# Patient Record
Sex: Male | Born: 1955 | Race: White | Hispanic: No | Marital: Married | Smoking: Never smoker
Health system: Southern US, Community
[De-identification: ages and names within clinical notes are randomized; demographics above are authoritative.]

## PROBLEM LIST (undated history)

## (undated) DIAGNOSIS — G4730 Sleep apnea, unspecified: Secondary | ICD-10-CM

## (undated) DIAGNOSIS — K5790 Diverticulosis of intestine, part unspecified, without perforation or abscess without bleeding: Secondary | ICD-10-CM

## (undated) DIAGNOSIS — M1990 Unspecified osteoarthritis, unspecified site: Secondary | ICD-10-CM

## (undated) DIAGNOSIS — E785 Hyperlipidemia, unspecified: Secondary | ICD-10-CM

## (undated) DIAGNOSIS — I2510 Atherosclerotic heart disease of native coronary artery without angina pectoris: Secondary | ICD-10-CM

## (undated) DIAGNOSIS — K648 Other hemorrhoids: Secondary | ICD-10-CM

## (undated) DIAGNOSIS — K635 Polyp of colon: Secondary | ICD-10-CM

## (undated) DIAGNOSIS — I2102 ST elevation (STEMI) myocardial infarction involving left anterior descending coronary artery: Secondary | ICD-10-CM

## (undated) DIAGNOSIS — I499 Cardiac arrhythmia, unspecified: Secondary | ICD-10-CM

## (undated) DIAGNOSIS — I4891 Unspecified atrial fibrillation: Secondary | ICD-10-CM

## (undated) DIAGNOSIS — I219 Acute myocardial infarction, unspecified: Secondary | ICD-10-CM

## (undated) DIAGNOSIS — I10 Essential (primary) hypertension: Secondary | ICD-10-CM

## (undated) HISTORY — PX: CORONARY ANGIOPLASTY: SHX604

## (undated) HISTORY — DX: Hyperlipidemia, unspecified: E78.5

## (undated) HISTORY — DX: Sleep apnea, unspecified: G47.30

## (undated) HISTORY — DX: Diverticulosis of intestine, part unspecified, without perforation or abscess without bleeding: K57.90

## (undated) HISTORY — DX: Other hemorrhoids: K64.8

## (undated) HISTORY — DX: Polyp of colon: K63.5

## (undated) HISTORY — DX: Essential (primary) hypertension: I10

## (undated) HISTORY — DX: Unspecified osteoarthritis, unspecified site: M19.90

## (undated) HISTORY — DX: Atherosclerotic heart disease of native coronary artery without angina pectoris: I25.10

## (undated) HISTORY — PX: MULTIPLE TOOTH EXTRACTIONS: SHX2053

---

## 2003-09-01 HISTORY — PX: OTHER SURGICAL HISTORY: SHX169

## 2003-09-13 ENCOUNTER — Encounter: Payer: Self-pay | Admitting: *Deleted

## 2003-09-13 ENCOUNTER — Ambulatory Visit (HOSPITAL_COMMUNITY): Admission: RE | Admit: 2003-09-13 | Discharge: 2003-09-14 | Payer: Self-pay | Admitting: *Deleted

## 2004-02-02 ENCOUNTER — Encounter: Payer: Self-pay | Admitting: Internal Medicine

## 2004-03-22 ENCOUNTER — Ambulatory Visit (HOSPITAL_COMMUNITY): Admission: RE | Admit: 2004-03-22 | Discharge: 2004-03-22 | Payer: Self-pay | Admitting: *Deleted

## 2006-02-20 ENCOUNTER — Ambulatory Visit: Payer: Self-pay | Admitting: Cardiology

## 2006-04-03 ENCOUNTER — Ambulatory Visit: Payer: Self-pay | Admitting: Cardiology

## 2007-09-09 ENCOUNTER — Encounter: Payer: Self-pay | Admitting: Internal Medicine

## 2007-09-17 ENCOUNTER — Ambulatory Visit: Payer: Self-pay | Admitting: Internal Medicine

## 2007-09-17 DIAGNOSIS — E785 Hyperlipidemia, unspecified: Secondary | ICD-10-CM | POA: Insufficient documentation

## 2007-09-17 DIAGNOSIS — I2510 Atherosclerotic heart disease of native coronary artery without angina pectoris: Secondary | ICD-10-CM | POA: Insufficient documentation

## 2007-09-17 DIAGNOSIS — I10 Essential (primary) hypertension: Secondary | ICD-10-CM | POA: Insufficient documentation

## 2007-09-20 LAB — CONVERTED CEMR LAB
ALT: 24 units/L (ref 0–53)
Albumin: 3.8 g/dL (ref 3.5–5.2)
BUN: 12 mg/dL (ref 6–23)
CO2: 31 meq/L (ref 19–32)
Calcium: 9.2 mg/dL (ref 8.4–10.5)
Chloride: 109 meq/L (ref 96–112)
Cholesterol: 156 mg/dL (ref 0–200)
Creatinine, Ser: 0.9 mg/dL (ref 0.4–1.5)
GFR calc Af Amer: 114 mL/min
GFR calc non Af Amer: 95 mL/min
Glucose, Bld: 110 mg/dL — ABNORMAL HIGH (ref 70–99)
HDL: 29.7 mg/dL — ABNORMAL LOW (ref >39.0)
LDL Cholesterol: 104 mg/dL — ABNORMAL HIGH (ref 0–99)
PSA: 0.71 ng/mL (ref 0.10–4.00)
Phosphorus: 2.9 mg/dL (ref 2.3–4.6)
Potassium: 4.3 meq/L (ref 3.5–5.1)
Sodium: 143 meq/L (ref 135–145)
Total CHOL/HDL Ratio: 5.3
Triglycerides: 111 mg/dL (ref 0–149)
VLDL: 22 mg/dL (ref 0–40)

## 2007-10-15 ENCOUNTER — Ambulatory Visit: Payer: Self-pay | Admitting: Gastroenterology

## 2007-11-01 ENCOUNTER — Encounter: Payer: Self-pay | Admitting: Internal Medicine

## 2007-11-01 ENCOUNTER — Ambulatory Visit: Payer: Self-pay | Admitting: Gastroenterology

## 2007-11-01 ENCOUNTER — Encounter: Payer: Self-pay | Admitting: Gastroenterology

## 2007-11-01 DIAGNOSIS — D126 Benign neoplasm of colon, unspecified: Secondary | ICD-10-CM

## 2007-11-01 DIAGNOSIS — K648 Other hemorrhoids: Secondary | ICD-10-CM | POA: Insufficient documentation

## 2007-11-01 DIAGNOSIS — K5730 Diverticulosis of large intestine without perforation or abscess without bleeding: Secondary | ICD-10-CM | POA: Insufficient documentation

## 2008-01-13 ENCOUNTER — Encounter: Payer: Self-pay | Admitting: Gastroenterology

## 2008-06-06 ENCOUNTER — Ambulatory Visit: Payer: Self-pay | Admitting: Internal Medicine

## 2008-08-18 ENCOUNTER — Encounter: Payer: Self-pay | Admitting: Internal Medicine

## 2008-09-27 ENCOUNTER — Ambulatory Visit: Payer: Self-pay | Admitting: Family Medicine

## 2008-10-24 ENCOUNTER — Encounter: Payer: Self-pay | Admitting: Internal Medicine

## 2009-06-15 ENCOUNTER — Encounter: Payer: Self-pay | Admitting: Internal Medicine

## 2010-06-18 ENCOUNTER — Telehealth: Payer: Self-pay | Admitting: Internal Medicine

## 2010-06-21 ENCOUNTER — Ambulatory Visit: Payer: Self-pay | Admitting: Internal Medicine

## 2010-07-25 ENCOUNTER — Ambulatory Visit: Payer: Self-pay | Admitting: Internal Medicine

## 2010-07-25 LAB — CONVERTED CEMR LAB
ALT: 18 units/L (ref 0–53)
AST: 17 units/L (ref 0–37)
Albumin: 3.9 g/dL (ref 3.5–5.2)
Alkaline Phosphatase: 41 units/L (ref 39–117)
BUN: 19 mg/dL (ref 6–23)
Basophils Absolute: 0 10*3/uL (ref 0.0–0.1)
Basophils Relative: 0.5 % (ref 0.0–3.0)
Bilirubin, Direct: 0.2 mg/dL (ref 0.0–0.3)
CO2: 31 meq/L (ref 19–32)
Calcium: 9.4 mg/dL (ref 8.4–10.5)
Chloride: 99 meq/L (ref 96–112)
Cholesterol: 126 mg/dL (ref 0–200)
Creatinine, Ser: 0.9 mg/dL (ref 0.4–1.5)
Eosinophils Absolute: 0.2 10*3/uL (ref 0.0–0.7)
Eosinophils Relative: 2.7 % (ref 0.0–5.0)
GFR calc non Af Amer: 91.99 mL/min (ref >60)
Glucose, Bld: 103 mg/dL — ABNORMAL HIGH (ref 70–99)
HCT: 45.5 % (ref 39.0–52.0)
HDL: 33.6 mg/dL — ABNORMAL LOW (ref >39.00)
Hemoglobin: 15.4 g/dL (ref 13.0–17.0)
LDL Cholesterol: 81 mg/dL (ref 0–99)
Lymphocytes Relative: 23.1 % (ref 12.0–46.0)
Lymphs Abs: 1.7 10*3/uL (ref 0.7–4.0)
MCHC: 33.8 g/dL (ref 30.0–36.0)
MCV: 86.9 fL (ref 78.0–100.0)
Monocytes Absolute: 0.8 10*3/uL (ref 0.1–1.0)
Monocytes Relative: 10.9 % (ref 3.0–12.0)
Neutro Abs: 4.6 10*3/uL (ref 1.4–7.7)
Neutrophils Relative %: 62.8 % (ref 43.0–77.0)
PSA: 0.92 ng/mL (ref 0.10–4.00)
Phosphorus: 2.8 mg/dL (ref 2.3–4.6)
Platelets: 213 10*3/uL (ref 150.0–400.0)
Potassium: 3.5 meq/L (ref 3.5–5.1)
RBC: 5.23 M/uL (ref 4.22–5.81)
RDW: 13.6 % (ref 11.5–14.6)
Sodium: 137 meq/L (ref 135–145)
TSH: 1.37 microintl units/mL (ref 0.35–5.50)
Total Bilirubin: 0.9 mg/dL (ref 0.3–1.2)
Total CHOL/HDL Ratio: 4
Total Protein: 7 g/dL (ref 6.0–8.3)
Triglycerides: 55 mg/dL (ref 0.0–149.0)
VLDL: 11 mg/dL (ref 0.0–40.0)
WBC: 7.3 10*3/uL (ref 4.5–10.5)

## 2010-12-18 ENCOUNTER — Ambulatory Visit: Admit: 2010-12-18 | Payer: Self-pay | Admitting: Internal Medicine

## 2010-12-25 ENCOUNTER — Ambulatory Visit
Admission: RE | Admit: 2010-12-25 | Discharge: 2010-12-25 | Payer: Self-pay | Source: Home / Self Care | Attending: Internal Medicine | Admitting: Internal Medicine

## 2010-12-25 ENCOUNTER — Encounter: Payer: Self-pay | Admitting: Internal Medicine

## 2010-12-25 DIAGNOSIS — M109 Gout, unspecified: Secondary | ICD-10-CM | POA: Insufficient documentation

## 2010-12-31 NOTE — Progress Notes (Signed)
Summary: regarding refills  Phone Note Call from Patient Call back at Home Phone [phone]   Caller: Spouse Summary of Call: Pt's cardiologist has joined [facility].  Pt has had bad experiences with that group and pt doesnt want to go there.  He is asking if you will start prescribing his BP and cholesterol meds.  Advised pt's wife that pt is due for an office visit. Initial call taken by: Lowella Petties CMA,  June 18, 2010 12:29 PM  Follow-up for Phone Call        I am sorry to hear about that since they are part of our group. Dr Mariah Milling is still the same person  I have no problems prescribing his meds though (can Rx x 1 year if needed) and we can arrange for cardiology appt only if he has new problems Follow-up by: Cindee Salt MD,  June 18, 2010 1:57 PM  Additional Follow-up for Phone Call Additional follow up Details #1::        Spoke to patient's wife and was informed that he will be out of refills in about 3 weeks. Patient's medications have changed since he was in last which was almost 2 years ago. Patient's wife is not sure as too what medications he is almost out of and will call back with a correct med list and the one's that he needs refills on. Sydell Axon LPN  June 18, 2010 4:58 PM      Additional Follow-up for Phone Call Additional follow up Details #2::    Pt's wife called with list of meds, meds are correct in the chart,  appt made for tomorrow.            Lowella Petties CMA  June 20, 2010 11:42 AM    Prior Medications: ZOCOR 40 MG  TABS (SIMVASTATIN) Take 1 tablet by mouth once a day AMLODIPINE BESYLATE 10 MG  TABS (AMLODIPINE BESYLATE) Take 1 tablet by mouth once a day ADULT ASPIRIN LOW STRENGTH 81 MG  TBDP (ASPIRIN) Take 1 tablet by mouth once a day HYDROCHLOROTHIAZIDE 25 MG  TABS (HYDROCHLOROTHIAZIDE) take 1 tablet by mouth once daily Current Allergies: LISINOPRIL (LISINOPRIL)

## 2010-12-31 NOTE — Assessment & Plan Note (Signed)
Summary: RENEW MEDS   Vital Signs:  Patient profile:   55 year old male Weight:      249.50 pounds BMI:     34.44 Temp:     98.5 degrees F oral Pulse rate:   60 / minute Pulse rhythm:   regular BP sitting:   128 / 86  (left arm) Cuff size:   large  Vitals Entered By: Janee Morn CMA (June 21, 2010 9:27 AM) CC: Renew meds   History of Present Illness: Had trouble getting Rx from [facility] cardiology in past has had lots of changes in doctors from leaving town etc Prefers to just have me give care  Still fairly active at work Engineer, water still No other structured exercise but tries to stay busy Tries to eat better than in past  No chest pain No SOB No change in exercise tolerance no ankle edema  No myalgia or GI problems with statin  Allergies: 1)  Lisinopril (Lisinopril)  Past History:  Past medical, surgical, family and social histories (including risk factors) reviewed for relevance to current acute and chronic problems.  Past Medical History: Reviewed history from 06/06/2008 and no changes required. Coronary artery disease Hypertension Hyperliipidemia Atypical chest pain Hemorrhoids Colon polyps Diverticulosis  Past Surgical History: Reviewed history from 09/17/2007 and no changes required. 10/04  Mid LAD cypher stent Chales Abrahams) 3/05 Cardiolite--non-reversible defect  EF--58% 2/08 Myoview stress negative  Family History: Reviewed history from 09/17/2007 and no changes required. Dad has DM, CAD Mom fairly healthy 2 brothers with HTN DM & CAD strong in family (both sides) No prostate or colon cancer  Social History: Reviewed history from 06/06/2008 and no changes required. 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 He  stopped it Now sleeping better  Physical Exam  General:  alert and normal appearance.   Neck:  supple, no masses, no thyromegaly, no carotid bruits, and no cervical lymphadenopathy.   Lungs:  normal respiratory effort, no intercostal retractions, no accessory muscle use, and normal breath sounds.   Heart:  normal rate, regular rhythm, no murmur, and no gallop.   Abdomen:  soft, non-tender, and no masses.   Msk:  no joint tenderness and no joint swelling.   Pulses:  normal in feet Extremities:  no edema Psych:  normally interactive, good eye contact, not anxious appearing, and not depressed appearing.     Impression & Recommendations:  Problem # 1:  CORONARY ARTERY DISEASE (ICD-414.00) Assessment Unchanged seems quiet discussed action if symptoms--even subtle  The following medications were removed from the medication list:    Adult Aspirin Low Strength 81 Mg Tbdp (Aspirin) .Marland Kitchen... Take 1 tablet by mouth once a day His updated medication list for this problem includes:    Amlodipine Besylate 10 Mg Tabs (Amlodipine besylate) .Marland Kitchen... Take 1 tablet by mouth once a day    Hydrochlorothiazide 50 Mg Tabs (Hydrochlorothiazide) .Marland Kitchen... 1 once daily    Aspir-low 81 Mg Tbec (Aspirin) .Marland Kitchen... 2 by mouth once daily  Problem # 2:  HYPERTENSION (ICD-401.9) Assessment: Unchanged good control no changes needed No beta blocker due to past bradycardia  His updated medication list for this problem includes:    Amlodipine Besylate 10 Mg Tabs (Amlodipine besylate) .Marland Kitchen... Take 1 tablet by mouth  once a day    Hydrochlorothiazide 50 Mg Tabs (Hydrochlorothiazide) .Marland Kitchen... 1 once daily  BP today: 128/86 Prior BP: 126/76 (09/27/2008)  Labs Reviewed: K+: 4.3 (09/17/2007) Creat: : 0.9 (09/17/2007)   Chol: 156 (09/17/2007)   HDL: 29.7 (09/17/2007)   LDL: 104 (09/17/2007)   TG: 111 (09/17/2007)  Problem # 3:  HYPERLIPIDEMIA (ICD-272.4) Assessment: Comment Only will change to pravastatin in view of Rx with  amlodipine  The following medications were removed from the medication list:    Zocor 40 Mg Tabs (Simvastatin) .Marland Kitchen... Take 1 tablet by mouth once a day His updated medication list for this problem includes:    Pravastatin Sodium 40 Mg Tabs (Pravastatin sodium) .Marland Kitchen... 2 tabs daily for high blood pressure  Complete Medication List: 1)  Amlodipine Besylate 10 Mg Tabs (Amlodipine besylate) .... Take 1 tablet by mouth once a day 2)  Hydrochlorothiazide 50 Mg Tabs (Hydrochlorothiazide) .Marland Kitchen.. 1 once daily 3)  Aspir-low 81 Mg Tbec (Aspirin) .... 2 by mouth once daily 4)  Pravastatin Sodium 40 Mg Tabs (Pravastatin sodium) .... 2 tabs daily for high blood pressure  Patient Instructions: 1)  Please schedule a follow-up appointment in 6 months for physcial 2)  Set up blood work in 4-6 weeks (fasting) 3)     lipid, hepatic--272.4 4)     PSA--V76.44 5)      CBC with diff, renal , TSH--  401.9 Prescriptions: AMLODIPINE BESYLATE 10 MG  TABS (AMLODIPINE BESYLATE) Take 1 tablet by mouth once a day  #30 x 12   Entered and Authorized by:   Cindee Salt MD   Signed by:   Cindee Salt MD on 06/21/2010   Method used:   Electronically to        [facility] [address] (retail)       [address], [address]       [place], [place]  [address]       Ph: [phone]       Fax: [phone]   RxID:   [id number] HYDROCHLOROTHIAZIDE 50 MG TABS (HYDROCHLOROTHIAZIDE) 1 once daily  #90 x 3   Entered and Authorized by:   Cindee Salt MD   Signed by:   Cindee Salt MD on 06/21/2010   Method used:   Electronically to        [facility] [address] (retail)       [address], [address]       [place], [place]  [address]       Ph: [phone]       Fax: [phone]   RxID:   [id number] PRAVASTATIN SODIUM 40 MG TABS (PRAVASTATIN SODIUM) 2 tabs daily for high blood pressure  #90 x 12   Entered and Authorized by:   Cindee Salt MD   Signed by:   Cindee Salt MD on 06/21/2010   Method used:   Electronically to        [facility] [address] (retail)       [address], [address]       [place], [place]  [address]       Ph: [phone]       Fax: [phone]   RxID:   [id number]   Current Allergies (reviewed today): LISINOPRIL (LISINOPRIL)

## 2011-01-02 NOTE — Assessment & Plan Note (Signed)
Summary: CPX/JRR   Vital Signs:  Patient profile:   55 year old male Weight:      244 pounds Temp:     98.5 degrees F oral Pulse rate:   59 / minute Pulse rhythm:   regular BP sitting:   148 / 90  (left arm) Cuff size:   large  Vitals Entered By: Mervin Hack CMA Duncan Dull) (December 25, 2010 10:02 AM) CC: adult physical   History of Present Illness: Doing fine No new concerns  Takes BP at fire station 130's/80's in general  Allergies: 1)  Lisinopril (Lisinopril)  Past History:  Past medical, surgical, family and social histories (including risk factors) reviewed for relevance to current acute and chronic problems.  Past Medical History: Coronary artery disease Hypertension Hyperliipidemia Atypical chest pain Hemorrhoids Colon polyps Diverticulosis Gout  Past Surgical History: Reviewed history from 09/17/2007 and no changes required. 10/04  Mid LAD cypher stent Chales Abrahams) 3/05 Cardiolite--non-reversible defect  EF--58% 2/08 Myoview stress negative  Family History: Reviewed history from 09/17/2007 and no changes required. Dad has DM, CAD Mom fairly healthy 2 brothers with HTN DM & CAD strong in family (both sides) No prostate or colon cancer  Social History: Reviewed history from 06/06/2008 and no changes required. 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 Eyes:  Denies double vision and vision loss-1 eye. ENT:  Denies decreased hearing and ringing in ears; teeth problems--only sees dentist as needed . CV:  Complains of shortness of breath with exertion; denies chest pain or discomfort, difficulty breathing at night, difficulty breathing while lying down, fainting, lightheadness, and palpitations; stable  DOE--conditioning. Resp:  Denies cough and shortness of breath. GI:  Denies abdominal pain, bloody stools, change in bowel habits, dark tarry stools, indigestion, nausea, and vomiting. GU:  Denies erectile dysfunction, urinary frequency, and urinary hesitancy. MS:  Complains of joint pain; denies joint swelling; right great toe hurts at times wife thinks it is the gout goes away without Rx eventually. Derm:  Denies lesion(s) and rash. Neuro:  Denies headaches, numbness, tingling, and weakness. Psych:  Denies anxiety and depression. Heme:  Denies abnormal bruising and enlarge lymph nodes. Allergy:  Complains of seasonal allergies and sneezing; occ uses OTC meds--they help.  Physical Exam  General:  alert and normal appearance.   Eyes:  pupils equal, pupils round, pupils reactive to light, and no optic disk abnormalities.   Ears:  R ear normal and L ear normal.   Mouth:  no erythema, no exudates, and no lesions.   Neck:  supple, no masses, no thyromegaly, no carotid bruits, and no cervical lymphadenopathy.   Lungs:  normal respiratory effort, no intercostal retractions, no accessory muscle use, and normal breath sounds.   Heart:  normal rate, regular rhythm, no murmur, and no gallop.   Abdomen:  soft, non-tender, and no masses.   Prostate:  deferred after discussion Msk:  no joint tenderness and no joint swelling.   Left 1st MTP is the point of his pain when it comes on  Pulses:  1+ in feet Extremities:  no edema Neurologic:  alert & oriented X3, strength normal in all extremities, and gait normal.   Skin:  no rashes and no suspicious lesions.   Axillary Nodes:  No palpable lymphadenopathy Psych:  normally interactive, good  eye contact, not anxious appearing, and not depressed appearing.     Impression & Recommendations:  Problem # 1:  PREVENTIVE HEALTH CARE (ICD-V70.0) Assessment Comment Only some gains in eating better and weight loss had PSA and colonoscopy  Problem # 2:   CORONARY ARTERY DISEASE (ICD-414.00) Assessment: Unchanged seems to be quiet bradycardia so no beta blocker will try ARB  The following medications were removed from the medication list:    Hydrochlorothiazide 50 Mg Tabs (Hydrochlorothiazide) .Marland Kitchen... 1 once daily His updated medication list for this problem includes:    Amlodipine Besylate 10 Mg Tabs (Amlodipine besylate) .Marland Kitchen... Take 1 tablet by mouth once a day    Aspir-low 81 Mg Tbec (Aspirin) .Marland Kitchen... 2 by mouth once daily    Losartan Potassium 50 Mg Tabs (Losartan potassium) .Marland Kitchen... 1 tab by mouth daily for blood pressure  Problem # 3:  GOUT (ICD-274.9) Assessment: New pretty classic history though not that severe will just stop HCTZ NSAIDs as needed   Problem # 4:  HYPERTENSION (ICD-401.9) Assessment: Comment Only will change diuretic to losartan have him monitor at fire station  The following medications were removed from the medication list:    Hydrochlorothiazide 50 Mg Tabs (Hydrochlorothiazide) .Marland Kitchen... 1 once daily His updated medication list for this problem includes:    Amlodipine Besylate 10 Mg Tabs (Amlodipine besylate) .Marland Kitchen... Take 1 tablet by mouth once a day    Losartan Potassium 50 Mg Tabs (Losartan potassium) .Marland Kitchen... 1 tab by mouth daily for blood pressure  BP today: 148/90 Prior BP: 128/86 (06/21/2010)  Labs Reviewed: K+: 3.5 (07/25/2010) Creat: : 0.9 (07/25/2010)   Chol: 126 (07/25/2010)   HDL: 33.60 (07/25/2010)   LDL: 81 (07/25/2010)   TG: 55.0 (07/25/2010)  Problem # 5:  HYPERLIPIDEMIA (ICD-272.4) Assessment: Unchanged good control  His updated medication list for this problem includes:    Pravastatin Sodium 40 Mg Tabs (Pravastatin sodium) .Marland Kitchen... 2 tabs daily for high blood pressure  Labs Reviewed: SGOT: 17 (07/25/2010)   SGPT: 18 (07/25/2010)   HDL:33.60 (07/25/2010), 29.7 (09/17/2007)  LDL:81 (07/25/2010), 104 (16/09/9603)  Chol:126 (07/25/2010), 156 (09/17/2007)  Trig:55.0 (07/25/2010), 111  (09/17/2007)  Complete Medication List: 1)  Amlodipine Besylate 10 Mg Tabs (Amlodipine besylate) .... Take 1 tablet by mouth once a day 2)  Aspir-low 81 Mg Tbec (Aspirin) .... 2 by mouth once daily 3)  Pravastatin Sodium 40 Mg Tabs (Pravastatin sodium) .... 2 tabs daily for high blood pressure 4)  Losartan Potassium 50 Mg Tabs (Losartan potassium) .Marland Kitchen.. 1 tab by mouth daily for blood pressure  Patient Instructions: 1)  Please stop the HCTZ and start losartan 2)  Please set up blood work in about 1 month (renal--401.9) 3)  Please schedule a follow-up appointment in 6 months .  4)  Please monitor your blood pressure about monthly. Call if over 150/95 Prescriptions: LOSARTAN POTASSIUM 50 MG TABS (LOSARTAN POTASSIUM) 1 tab by mouth daily for blood pressure  #30 x 11   Entered and Authorized by:   Cindee Salt MD   Signed by:   Cindee Salt MD on 12/25/2010   Method used:   Electronically to        [facility] [address] ([facility])       [address]       [place], [place]  [address]       Ph: [phone]       Fax: [phone]  RxID:   [id number]    Orders Added: 1)  Est. Patient 40-64 years [[address]]    Current Allergies (reviewed today): LISINOPRIL (LISINOPRIL)

## 2011-01-23 ENCOUNTER — Other Ambulatory Visit: Payer: Self-pay | Admitting: Internal Medicine

## 2011-01-23 ENCOUNTER — Other Ambulatory Visit (INDEPENDENT_AMBULATORY_CARE_PROVIDER_SITE_OTHER): Payer: BC Managed Care – PPO

## 2011-01-23 ENCOUNTER — Encounter (INDEPENDENT_AMBULATORY_CARE_PROVIDER_SITE_OTHER): Payer: Self-pay | Admitting: *Deleted

## 2011-01-23 DIAGNOSIS — I10 Essential (primary) hypertension: Secondary | ICD-10-CM

## 2011-01-23 LAB — RENAL FUNCTION PANEL
Albumin: 4.2 g/dL (ref 3.5–5.2)
BUN: 16 mg/dL (ref 6–23)
CO2: 29 mEq/L (ref 19–32)
Calcium: 9.5 mg/dL (ref 8.4–10.5)
Chloride: 104 mEq/L (ref 96–112)
Creatinine, Ser: 1.1 mg/dL (ref 0.4–1.5)
GFR: 76.99 mL/min (ref >60.00)
Glucose, Bld: 108 mg/dL — ABNORMAL HIGH (ref 70–99)
Phosphorus: 2.8 mg/dL (ref 2.3–4.6)
Potassium: 4.2 mEq/L (ref 3.5–5.1)
Sodium: 138 mEq/L (ref 135–145)

## 2011-04-15 NOTE — Assessment & Plan Note (Signed)
[facility]                         GASTROENTEROLOGY OFFICE NOTE   Doyle, Matthew Doyle                      MRN:          [id number]  DATE:10/15/2007                            DOB:          Apr 22, 1956    PHYSICIAN REQUESTING CONSULT:  Dr. Tillman Abide.   REASON FOR CONSULT:  Hematochezia and constipation.   HISTORY OF PRESENT ILLNESS:  Matthew Doyle is a 55 year old white male who  has a history of coronary artery disease status post MI in 2004 with a  LAD stent placed.  He relates problems with constipation since several  cardiac medications were started and he has noted small amounts of  bright red blood per rectum since his constipation began.  He notes no  change in stool caliber, abdominal pain, rectal pain or melena.  His  cardiac disease is felt to be stable at this point.   FAMILY HISTORY:  Remarkable for mother with ulcerative colitis.  No  family members with colon polyps, colon cancer or inflammatory bowel  disease.   PAST MEDICAL HISTORY:  1. Hypertension.  2. Coronary artery disease.  3. Hyperlipidemia.  4. Status post MI.  5. Status post LAD stent placement October 2004.   CURRENT MEDICATIONS:  Listed on the chart, updated and reviewed.   MEDICATION ALLERGIES:  PENICILLIN.   SOCIAL HISTORY:  Per the handwritten form.   REVIEW OF SYSTEMS:  Per the handwritten form.   PHYSICAL EXAMINATION:  Overweight white male, in no acute distress.  Height 6 feet, weight 258.8, blood pressure is 140/84, pulse 68 and  regular.  HEENT:  Anicteric sclerae, oropharynx clear.  CHEST:  Clear to auscultation bilaterally.  CARDIAC:  Regular rate and rhythm without murmurs appreciated.  ABDOMEN:  Soft, nontender, nondistended, normoactive bowel sounds.  No  palpable organomegaly, masses or hernias.  RECTAL:  Deferred to time of colonoscopy.  EXTREMITIES:  Without clubbing, cyanosis or edema.  NEUROLOGIC:  Alert and oriented x3, grossly  nonfocal.   ASSESSMENT/PLAN:  Small volume hematochezia and constipation.  His  constipation may be medication induced.  Need to rule out colorectal  neoplasms, hemorrhoids and other disorders.  Begin a high fiber diet  with increased fluid intake.  Risks, benefits and alternatives to  colonoscopy and possible biopsy, possible polypectomy and possible  destruction of internal hemorrhoids discussed with the patient and he  consents to proceed.  This will be scheduled electively.     Matthew Doyle. Russella Dar, MD, [facility]  Electronically Signed    MTS/MedQ  DD: 10/22/2007  DT: 10/22/2007  Job #: [id number]   cc:   Karie Schwalbe, MD

## 2011-04-18 NOTE — Cardiovascular Report (Signed)
NAME:  Matthew Doyle, Matthew Doyle NO.:  [id number]   MEDICAL RECORD NO.:  [id number]                   PATIENT TYPE:  OIB   LOCATION:  2899                                 FACILITY:  [facility]   PHYSICIAN:  Carole Binning, M.D. [facility]         DATE OF BIRTH:  1956/03/21   DATE OF PROCEDURE:  03/22/2004  DATE OF DISCHARGE:  03/22/2004                              CARDIAC CATHETERIZATION   PROCEDURE PERFORMED:  Left heart catheterization with coronary angiography  and left ventriculography.   INDICATION:  [name] is a 55 year old male with history of previous stent  to the left anterior descending in October 2004.  He presented to the office  with symptoms of recurrent chest pain worrisome for angina.  A stress  Cardiolite showed a possible inferior and apical scar, but no ischemia.  However, because of progressive symptoms of chest pain he was referred for  cardiac catheterization to rule out obstructive coronary artery disease.   CATHETERIZATION PROCEDURAL NOTE:  A 6 French sheath was placed in the right  femoral artery.  Coronary angiography was performed with standard Judkins 6  French catheters.  Left ventriculography was performed with an angled  pigtail catheter.  Contrast was Omnipaque.  There were no complications.   CATHETERIZATION RESULTS:   HEMODYNAMICS:  1. Left ventricular pressure 116/18.  2. Aortic pressure 120/76.  3. There is no aortic valve gradient.   LEFT VENTRICULOGRAM:  There is possibly mild hypokinesis of the apical wall.  Otherwise, wall motion is normal.  Ejection fraction estimated at 60%.  There si no significant mitral regurgitation.   CORONARY ARTERIOGRAPHY (RIGHT DOMINANT):  Left main is normal.   Left anterior descending artery has a 20% stenosis in the proximal vessel  and a 40% stenosis in the mid vessel.  Just beyond this 40%, there is a  stent in the mid vessel which is widely patent with 0% stenosis within the  stent.   The LAD tapers after the stent and is relatively slender, but does  curl the apex.  The LAD gives rise to a large first diagonal branch which  has a diffuse 25% stenosis proximally.  There is a very small second  diagonal branch arising from within the stented segment of the LAD which has  an 80% stenosis at its ostium.   Left circumflex gives rise to a normal size OM-1 and a large OM-2.  There is  a diffuse 20% stenosis beginning in the mid circumflex extending into second  obtuse marginal branch.   Right coronary artery is a large dominant vessel.  There is a 40-50%  stenosis in the proximal right coronary artery.  The distal right coronary  artery gives rise to a normal size posterior descending artery and a normal  size posterior lateral branch.  The posterior descending artery has a 30%  stenosis in the mid body.   IMPRESSION:  1. Preserved left ventricular systolic function.  2. Patent stent in the left anterior descending artery with moderate disease     in a very small diagonal branch which     arises from within the stented segment of vessel.  Otherwise, there is     moderate but nonobstructive coronary artery disease.   RECOMMENDATIONS:  Continued medical therapy.                                               Carole Binning, M.D. [facility]    MWP/MEDQ  D:  03/22/2004  T:  03/23/2004  Job:  [id number]   cc:   Dr. Tillman Abide

## 2011-04-18 NOTE — Cardiovascular Report (Signed)
NAME:  Matthew Doyle, Matthew Doyle                         ACCOUNT NO.:  [id number]   MEDICAL RECORD NO.:  [id number]                   PATIENT TYPE:  OIB   LOCATION:  2855                                 FACILITY:  [facility]   PHYSICIAN:  Veneda Melter, M.D.                   DATE OF BIRTH:  Aug 10, 1956   DATE OF PROCEDURE:  09/13/2003  DATE OF DISCHARGE:                              CARDIAC CATHETERIZATION   PROCEDURES PERFORMED:  1. Left heart catheterization.  2. Left ventriculogram.  3. Selective coronary angiography.  4. Abdominal aortogram.  5. Percutaneous transluminal coronary angioplasty and stent placement to the     mid left anterior descending.  6. Percutaneous transluminal coronary angioplasty to the second diagonal     branch of the left anterior descending.  7. Perclose right femoral artery.   DIAGNOSES:  1. Single-vessel coronary artery disease.  2. Normal left ventricular systolic function.   HISTORY:  Matthew Doyle is a 55 year old gentleman who presents with  exertional chest discomfort that has been increasing in frequency and  severity.  The patient underwent stress imaging study which showed ischemia  in the apical walls and he is referred for further assessment.   TECHNIQUE:  Informed consent was obtained.  The patient brought to the  catheterization lab.  A 6 French sheath was placed in the right femoral  artery using the modified Seldinger technique.  A 6 [place] JL-4 and JR-4  catheter was then used to engage the left and right coronary arteries and  selective angiography performed in various projections using manual  injection contrast.  A 6 French pigtail catheter was advanced in the left  ventricle and a left ventriculogram performed using power injection  contrast.  Pigtail was brought back in the descending aorta and abdominal  aortogram performed using power injection contrast.   FINDINGS:   LEFT HEART CATHETERIZATION:  1. Left main trunk:  Medium caliber  vessel with narrowings of 30% in the mid     section.  2. LAD:  This is a large-caliber vessel that provides two diagonal branches     in the mid section.  The LAD has mild disease of 30-40% in the proximal     segment.  There is then high grade narrowing of 80-90% in the mid section     encompassing the second diagonal branch.  The distal LAD has mild     irregularities.  The first diagonal branch has diffuse disease of 30-40%     in the proximal segment.  The second diagonal branch has an ostial     narrowing of 50-70%.  3. Left circumflex artery:  This is a medium-caliber vessel that provides     small first marginal branch in the mid section, large second marginal     branch distally.  The AV circumflex has diffuse disease of 30%  extending     into  the second marginal branch.  The first marginal branch has an ostial     narrowing of 50%.  4. Right coronary artery is dominant.  This is a large-caliber vessel that     provides posterior descending artery in the posterior ventricular branch,     terminal segment.  The right coronary has diffuse disease of 30% in the     proximal mid section.  The posterior descending artery has diffuse     disease of 30% as well.    LEFT VENTRICULOGRAPHY:  1. Normal end-systolic and end-diastolic dimensions.  2. Overall left ventricular function appears well preserved.  3. Ejection fraction greater than 55%.  4. There is mild hypokinesis of the distal anterior wall.  5. No mitral regurgitation.  6. LV pressure is 125/10.  7. Aortic pressure is 125/80.  8. LVEDP equals 20.   ABDOMINAL AORTOGRAPHY:  1. Abdominal  aorta is of normal caliber with mild atheromatous disease.  2. The renal arteries are single and widely patent bilaterally.  3. The iliac arteries are patent with mild irregularities.   With these findings, we would like to proceed with percutaneous intervention  to the mid LAD.  The patient was enrolled in the Steeple study and   anticoagulated per protocol with Lovenox and Integrelin.  He was also given  Plavix 300 mg at termination of the case.  A 6 French CLS 4 guide catheter  was used to engage the left coronary artery.  0.014-inch Forte wire advanced  in the distal LAD.  The Forte wire was used to size the lesion length and  vessel diameter.  A 3.0 x 15-mm Maverick balloon was then introduced.  Two  inflations were performed at 6 atmospheres for 30 seconds and then a repeat  inflation of 10 atmospheres for 30 seconds in the area of severe stenosis.  Repeat angiography showed excellent result with significant improvement in  vessel lumen flow.  There did not appear to be compromise of the diagonal  branch.  A 3.0 x 23-mm Cypher stent was then introduced and carefully  positioned in the mid LAD and deployed at 12 atmospheres for 35 seconds.  A  3.25 x 12-mm Quantum Maverick balloon was then introduced and used to post  dilate the stent at 12 atmospheres for 30 seconds in the distal section, 14  atmospheres for 30 seconds in the proximal segment and 16 atmospheres for 30  seconds in the mid section.   Repeat angiography showed an excellent result with full coverage of the  lesion, no residual stenosis in the LAD.  However, the diagonal branch was  compromised with 99% narrowing and TIMI-2 flow.  The patient did have some  chest discomfort with this.  Several wires were introduced and finally a  reflex wire was positioned in the second diagonal branch. A 2.5 x 9-mm  Maverick balloon was introduced to two inflations performed at 6 atmospheres  for 30 seconds in the proximal and ostial segment and single inflation of 8  atmospheres for 60 seconds.  Repeat angiography showed recannulization of  the vessel with residual disease of 30-40% and TIMI-3 flow.  There was mild  plaque shifting in the native LAD and the 3.25 x 12-mm Quantum Maverick was reintroduced.  Single inflation performed at 12 atmospheres for 30  seconds  in the mid section.  Repeat angiography showed an excellent result with no  residual stenosis and TIMI-3 flow through the LAD.  There was mild residual  narrowing  at the ostium of the diagonal branch of 30-50%.  However, there  was no TIMI-3 flow and he had resolution of chest discomfort.  No vessel  damage was noted. The guide catheter was removed and the Perclose suture  closure device deployed in the right femoral artery.  Adequate hemostasis  was  achieved and the patient transferred to the floor in stable condition.   FINAL RESULTS:  Successful percutaneous transluminal coronary angioplasty  and stent placement in the mid left anterior descending with reduction of  80% narrowing to 0% with placement of a 3.0 x 23-mm Cypher drug-eluting  stent dilated to 3.25 mm.    ASSESSMENT AND PLAN:  Matthew Doyle is a 55 year old gentleman with aggressive  coronary atherosclerotic disease.  He will be placed on Plavix for a minimum  of six months time.  Aggressive risk factor modification will be pursued  including weight loss and attention to diet and he will benefit from high  dose statin therapy.                                                 Veneda Melter, M.D.    NG/MEDQ  D:  09/13/2003  T:  09/13/2003  Job:  [id number]

## 2011-04-18 NOTE — Discharge Summary (Signed)
NAME:  Matthew Doyle, Matthew Doyle                         ACCOUNT NO.:  [id number]   MEDICAL RECORD NO.:  [id number]                   PATIENT TYPE:  OIB   LOCATION:  6529                                 FACILITY:  [facility]   PHYSICIAN:  Veneda Melter, M.D.                   DATE OF BIRTH:  Sep 09, 1956   DATE OF ADMISSION:  09/13/2003  DATE OF DISCHARGE:  09/14/2003                           DISCHARGE SUMMARY - REFERRING   DISCHARGE DIAGNOSES:  1. Coronary artery disease, status post stent to the left anterior     descending.  2. Hypertension, treated.  3. Hyperlipidemia, treated.   HISTORY OF PRESENT ILLNESS:  Mr. Cavins is a 55 year old male patient who  presented to the office on August 23, 2003, for assessment of recent  chest discomfort.  He has a history of hypertension in 1989, and at that  time, he was treated with antihypertensives.  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   Four days prior to the appointment in the office on August 23, 2003, he  noted also substernal chest pain that was not relieved with Pepcid or TUMS.  It did increase with activity and decreased with rest.  For this reason, we  performed a Cardiolite study on August 31, 2003, and this revealed mild  apical ischemia with EF 56%.  There was also the possibility of a transient  cavity dilatation noted on the short axis image.  The patient was admitted  on September 13, 2003, for cardiac catheterization.   HOSPITAL COURSE:  The catheterization reveals the following:  Left main 20%,  LAD 80%, mid and left circumflex 30%, AV circumflex 50%, OM1 RCA 30%, EF  greater than 55% with mild anterior hypokinesis, negative MR.  The patient  underwent PTCA/CYPHER stent to the LAD, reducing the 80% lesion to 0%  postprocedure.  He underwent a PTCA of the second diagonal reducing  a 99%  lesion to a 30% postprocedure.  Perclose was utilized.   The patient tolerated the procedure well and was kept in the hospital  overnight for observation.   LABORATORY DATA:  Hemoglobin 14.5, hematocrit 41.9, platelets 212,000, white  count 8.7.  Sodium 139, potassium 4.9, BUN 11, creatinine 1.0.  There was a  transient elevation in the patient's enzymes postprocedure with total CK  123, MB fraction 8.8, Troponin 0.56.  Dr. Chales Abrahams felt this was secondary to  the diagonal intervention.   DISPOSITION:  At this point, the patient was discharged to home in stable  condition.  We have opted to place him on a high-dose statin and he will  need blood work set up at his next office visit.   DISCHARGE MEDICATIONS:  1. Atenolol 25 mg daily.  2. Norvasc 10 mg daily.  3. Diovan  160 mg daily.  4. Plavix 75 mg daily.  5. Coated aspirin 325 mg daily.  6. HCTZ 25 mg daily.  7. Lipitor 40 mg at bedtime.  8. Tylenol as needed for pain.   ACTIVITY:  No strenuous activities, no driving x2 days and may gradually  increase activity.   DIET:  Remain on low-fat diet.   WOUND CARE:  Keep wounds clean with soap and water.   FOLLOWUP:  Follow up in the office on September 29, 2003, at 12 noon.   He is on the STEEPLE Trial and will need to take his aspirin on a daily  basis.  They will call for a followup appointment.      Cathlyn Parsons, P.A.-C  [facility]               Veneda Melter, M.D.    LDB/MEDQ  D:  09/14/2003  T:  09/14/2003  Job:  [id number]

## 2011-06-25 ENCOUNTER — Other Ambulatory Visit: Payer: Self-pay | Admitting: Internal Medicine

## 2011-06-26 NOTE — Telephone Encounter (Signed)
rx sent to pharmacy by e-script  

## 2011-07-07 ENCOUNTER — Ambulatory Visit (INDEPENDENT_AMBULATORY_CARE_PROVIDER_SITE_OTHER): Payer: BC Managed Care – PPO | Admitting: Internal Medicine

## 2011-07-07 ENCOUNTER — Encounter: Payer: Self-pay | Admitting: Internal Medicine

## 2011-07-07 DIAGNOSIS — I2510 Atherosclerotic heart disease of native coronary artery without angina pectoris: Secondary | ICD-10-CM

## 2011-07-07 DIAGNOSIS — E785 Hyperlipidemia, unspecified: Secondary | ICD-10-CM

## 2011-07-07 DIAGNOSIS — M109 Gout, unspecified: Secondary | ICD-10-CM

## 2011-07-07 DIAGNOSIS — I10 Essential (primary) hypertension: Secondary | ICD-10-CM

## 2011-07-07 MED ORDER — PRAVASTATIN SODIUM 40 MG PO TABS: 80.0000 mg | ORAL_TABLET | Freq: Every day | ORAL | Status: DC

## 2011-07-07 MED ORDER — AMLODIPINE BESYLATE 10 MG PO TABS: 10.0000 mg | ORAL_TABLET | Freq: Every day | ORAL | Status: DC

## 2011-07-07 MED ORDER — LOSARTAN POTASSIUM 50 MG PO TABS: 50.0000 mg | ORAL_TABLET | Freq: Every day | ORAL | Status: DC

## 2011-07-07 NOTE — Assessment & Plan Note (Signed)
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 Discussed trying tylenol prn

## 2011-07-07 NOTE — Assessment & Plan Note (Signed)
Quiet On statin, ARB and asa No beta blocker due to relative bradycardia at times

## 2011-07-07 NOTE — Assessment & Plan Note (Signed)
Good control on the losartan No changes Lab Results  Component Value Date   CREATININE 1.1 01/23/2011

## 2011-07-07 NOTE — Assessment & Plan Note (Signed)
No problems with med Lab Results  Component Value Date   LDLCALC 81 07/25/2010

## 2011-07-07 NOTE — Progress Notes (Signed)
  Subjective:    Patient ID: Matthew Doyle, male    DOB: 1956/04/05, 55 y.o.   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    Allergies  Allergen Reactions  . Lisinopril     REACTION: cough with this and/or benazepril    Past Medical History  Diagnosis Date  . CAD (coronary artery disease)     cardiolite- non reversible defect EF 58%, 3/05.  Myoview stress negative 2/08.  Marland Kitchen Hypertension   . Hyperlipidemia   . Atypical chest pain   . Hemorrhoids   . Colon polyps   . Diverticulosis   . Gout     Past Surgical History  Procedure Date  . Cyper stent 10/04    mid LAD- gupta    Family History  Problem Relation Age of Onset  . Diabetes Father   . Coronary artery disease Father   . Hypertension Brother   . Hypertension Brother     History   Social History  . Marital Status: Married    Spouse Name: N/A    Number of Children: 2  . Years of Education: N/A   Occupational History  . works at [employer]   . Naval architect for [employer]   . plays in [employer]    Social History Main Topics  . Smoking status: Never Smoker   . Smokeless tobacco: Not on file  . Alcohol Use: Yes  . Drug Use: Not on file  . Sexually Active: Not on file   Other Topics Concern  . Not on file   Social History Narrative  . No narrative on file   Review of Systems Still losing weight Being careful with eating Weight down 13# Has been active at work---different job resoponsibilities   Objective:   Physical Exam  Constitutional: He appears well-developed and well-nourished. No distress.  Neck: Normal range of motion. Neck supple. No  thyromegaly present.  Cardiovascular: Normal rate, regular rhythm, normal heart sounds and intact distal pulses.  Exam reveals no gallop.   No murmur heard. Pulmonary/Chest: Effort normal and breath sounds normal. No respiratory distress. He has no wheezes. He has no rales.  Musculoskeletal: He exhibits no edema and no tenderness.       No active inflammation in left MTP or knees  Lymphadenopathy:    He has no cervical adenopathy.  Skin: Skin is warm. No rash noted.  Psychiatric: He has a normal mood and affect. His behavior is normal. Judgment and thought content normal.          Assessment & Plan:

## 2011-08-21 ENCOUNTER — Other Ambulatory Visit: Payer: Self-pay | Admitting: *Deleted

## 2011-08-21 MED ORDER — PRAVASTATIN SODIUM 40 MG PO TABS: 80.0000 mg | ORAL_TABLET | Freq: Every day | ORAL | Status: DC

## 2011-08-21 NOTE — Telephone Encounter (Signed)
rx sent to pharmacy by e-script  

## 2012-01-06 ENCOUNTER — Other Ambulatory Visit: Payer: Self-pay | Admitting: Internal Medicine

## 2012-01-06 NOTE — Telephone Encounter (Signed)
Pt is calling about his CPE and can't get one until June. He will run out of his Losartin before then and needs a refill. He has enough to last one more week. [facility] on [address] is his Pharmacy.

## 2012-01-06 NOTE — Telephone Encounter (Signed)
Wife spoke with [facility] and pt have enough refills.

## 2012-01-06 NOTE — Telephone Encounter (Signed)
Spoke with wife and advised that he should have enough medication to last until 07/2012, we refilled for a year in 07/2011. Wife will check with [facility] on refills.

## 2012-01-07 ENCOUNTER — Encounter: Payer: BC Managed Care – PPO | Admitting: Internal Medicine

## 2012-02-02 ENCOUNTER — Encounter: Payer: Self-pay | Admitting: Internal Medicine

## 2012-02-02 ENCOUNTER — Ambulatory Visit (INDEPENDENT_AMBULATORY_CARE_PROVIDER_SITE_OTHER): Payer: BC Managed Care – PPO | Admitting: Family Medicine

## 2012-02-02 ENCOUNTER — Encounter: Payer: Self-pay | Admitting: Family Medicine

## 2012-02-02 VITALS — BP 116/78 | HR 72 | Temp 98.6°F | Wt 251.2 lb

## 2012-02-02 DIAGNOSIS — M109 Gout, unspecified: Secondary | ICD-10-CM | POA: Insufficient documentation

## 2012-02-02 MED ORDER — INDOMETHACIN 50 MG PO CAPS: 50.0000 mg | ORAL_CAPSULE | Freq: Two times a day (BID) | ORAL | Status: AC

## 2012-02-02 NOTE — Assessment & Plan Note (Signed)
Treat with indocin. Discussed gout, see pt handout. If not better, add colchicine.  Pt wants to start with indocin as cheaper. Discussed decreased effectiveness of asa with NSAIDs but as anticipated short duration will tolerate. To call us if not improving as expected. Check Cr and UA today.

## 2012-02-02 NOTE — Progress Notes (Signed)
  Subjective:    Patient ID: KENYADA HY, male    DOB: Apr 19, 1956, 56 y.o.   MRN: [id number]  HPI CC: left foot pain  H/o CAD with stent placed, HTN, HLD.  Over last year worsening pain at 1st MTP joint.  Got acutely worse over last 4-5 days.  At first thought arthritis but getting worse.  Feels left MTP joint swollen, red.  Bad in morning better when walking on foot, then at night gets worse again.  Very sensitive, even with sheet on top.  Has tried tylenol and ibuprofen/advil for this.  Not really helped.  Also notices left foot getting more red and darker than right side.  H/o joint pains in knees.  No fevers/chills, denies inciting injury/trauma to foot.  Denies h/o gout, but dx in chart.  Did change bp meds from HCTZ to amlodipine 6-8 mo ago 2/2 concern for gout.  Red meat 4-5 x/wk  Wife smokes at home.  Sometimes inside.  Review of Systems Per HPI    Objective:   Physical Exam  Nursing note and vitals reviewed. Cardiovascular:  Pulses:      Dorsalis pedis pulses are 2+ on the right side, and 2+ on the left side.       Posterior tibial pulses are 2+ on the right side, and 2+ on the left side.  Musculoskeletal: He exhibits no edema.       L 1st MTP swelling, exquisitely tender to palpation.  Warm and red compared to R side. No pain with axial loading of great toe.  Skin:       Left 1st MTP erythematous.  Good cap refill.       Assessment & Plan:

## 2012-02-02 NOTE — Patient Instructions (Signed)
I think you do have gout - treat with indocin twice daily for 3-5 days.   Update [place] if not improving as expected - would probably send in colchicine to help with pain. blood work today  Gout Gout is an inflammatory condition (arthritis) caused by a buildup of uric acid crystals in the joints. Uric acid is a chemical that is normally present in the blood. Under some circumstances, uric acid can form into crystals in your joints. This causes joint redness, soreness, and swelling (inflammation). Repeat attacks are common. Over time, uric acid crystals can form into masses (tophi) near a joint, causing disfigurement. Gout is treatable and often preventable. CAUSES  The disease begins with elevated levels of uric acid in the blood. Uric acid is produced by your body when it breaks down a naturally found substance called purines. This also happens when you eat certain foods such as meats and fish. Causes of an elevated uric acid level include:  Being passed down from parent to child (heredity).   Diseases that cause increased uric acid production (obesity, psoriasis, some cancers).   Excessive alcohol use.   Diet, especially diets rich in meat and seafood.   Medicines, including certain cancer-fighting drugs (chemotherapy), diuretics, and aspirin.   Chronic kidney disease. The kidneys are no longer able to remove uric acid well.   Problems with metabolism.  Conditions strongly associated with gout include:  Obesity.   High blood pressure.   High cholesterol.   Diabetes.  Not everyone with elevated uric acid levels gets gout. It is not understood why some people get gout and others do not. Surgery, joint injury, and eating too much of certain foods are some of the factors that can lead to gout. SYMPTOMS   An attack of gout comes on quickly. It causes intense pain with redness, swelling, and warmth in a joint.   Fever can occur.   Often, only one joint is involved. Certain joints are  more commonly involved:   Base of the big toe.   Knee.   Ankle.   Wrist.   Finger.  Without treatment, an attack usually goes away in a few days to weeks. Between attacks, you usually will not have symptoms, which is different from many other forms of arthritis. DIAGNOSIS  Your caregiver will suspect gout based on your symptoms and exam. Removal of fluid from the joint (arthrocentesis) is done to check for uric acid crystals. Your caregiver will give you a medicine that numbs the area (local anesthetic) and use a needle to remove joint fluid for exam. Gout is confirmed when uric acid crystals are seen in joint fluid, using a special microscope. Sometimes, blood, urine, and X-ray tests are also used. TREATMENT  There are 2 phases to gout treatment: treating the sudden onset (acute) attack and preventing attacks (prophylaxis). Treatment of an Acute Attack  Medicines are used. These include anti-inflammatory medicines or steroid medicines.   An injection of steroid medicine into the affected joint is sometimes necessary.   The painful joint is rested. Movement can worsen the arthritis.   You may use warm or cold treatments on painful joints, depending which works best for you.   Discuss the use of coffee, vitamin C, or cherries with your caregiver. These may be helpful treatment options.  Treatment to Prevent Attacks After the acute attack subsides, your caregiver may advise prophylactic medicine. These medicines either help your kidneys eliminate uric acid from your body or decrease your uric acid production. You  may need to stay on these medicines for a very long time. The early phase of treatment with prophylactic medicine can be associated with an increase in acute gout attacks. For this reason, during the first few months of treatment, your caregiver may also advise you to take medicines usually used for acute gout treatment. Be sure you understand your caregiver's directions. You  should also discuss dietary treatment with your caregiver. Certain foods such as meats and fish can increase uric acid levels. Other foods such as dairy can decrease levels. Your caregiver can give you a list of foods to avoid. HOME CARE INSTRUCTIONS   Do not take aspirin to relieve pain. This raises uric acid levels.   Only take over-the-counter or prescription medicines for pain, discomfort, or fever as directed by your caregiver.   Rest the joint as much as possible. When in bed, keep sheets and blankets off painful areas.   Keep the affected joint raised (elevated).   Use crutches if the painful joint is in your leg.   Drink enough water and fluids to keep your urine clear or pale yellow. This helps your body get rid of uric acid. Do not drink alcoholic beverages. They slow the passage of uric acid.   Follow your caregiver's dietary instructions. Pay careful attention to the amount of protein you eat. Your daily diet should emphasize fruits, vegetables, whole grains, and fat-free or low-fat milk products.   Maintain a healthy body weight.  SEEK MEDICAL CARE IF:   You have an oral temperature above 102 F (38.9 C).   You develop diarrhea, vomiting, or any side effects from medicines.   You do not feel better in 24 hours, or you are getting worse.  SEEK IMMEDIATE MEDICAL CARE IF:   Your joint becomes suddenly more tender and you have:   Chills.   An oral temperature above 102 F (38.9 C), not controlled by medicine.  MAKE SURE YOU:   Understand these instructions.   Will watch your condition.   Will get help right away if you are not doing well or get worse.  Document Released: 11/14/2000 Document Revised: 11/06/2011 Document Reviewed: 02/25/2010 [facility] Patient Information 2012 [place], [place].

## 2012-02-03 LAB — CREATININE, SERUM: Creatinine, Ser: 1.1 mg/dL (ref 0.4–1.5)

## 2012-04-27 ENCOUNTER — Telehealth: Payer: Self-pay

## 2012-04-27 NOTE — Telephone Encounter (Signed)
pts wife concerned top of left foot 1/2 " by 2" area of dark blood under skin. No broken areas, no pain,no swelling(appears slightly puffy); no known injury. Pts wife does not think gout. Pt has strong family hx of diabetes. Pt does not want appt. [facility] [address] Please advise.

## 2012-04-27 NOTE — Telephone Encounter (Signed)
I can't possibly figure out what is going on over the phone If there is no pain and he can walk okay---probably not an emergency No Rx since I am not sure any rx is indicated  He was to have set up physical for past February Offer appt to reschedule this If any pain in foot---or worsens---he needs appt

## 2012-04-27 NOTE — Telephone Encounter (Signed)
Spoke with patient's wife and advised results and she will try and talk pt into coming for an appointment.

## 2012-05-13 ENCOUNTER — Ambulatory Visit (INDEPENDENT_AMBULATORY_CARE_PROVIDER_SITE_OTHER): Payer: BC Managed Care – PPO | Admitting: Internal Medicine

## 2012-05-13 ENCOUNTER — Encounter: Payer: Self-pay | Admitting: Internal Medicine

## 2012-05-13 VITALS — BP 128/90 | HR 60 | Temp 98.4°F | Ht 71.0 in | Wt 241.0 lb

## 2012-05-13 DIAGNOSIS — Z0000 Encounter for general adult medical examination without abnormal findings: Secondary | ICD-10-CM

## 2012-05-13 DIAGNOSIS — I10 Essential (primary) hypertension: Secondary | ICD-10-CM

## 2012-05-13 DIAGNOSIS — E785 Hyperlipidemia, unspecified: Secondary | ICD-10-CM

## 2012-05-13 DIAGNOSIS — I2510 Atherosclerotic heart disease of native coronary artery without angina pectoris: Secondary | ICD-10-CM

## 2012-05-13 DIAGNOSIS — M109 Gout, unspecified: Secondary | ICD-10-CM

## 2012-05-13 MED ORDER — AMLODIPINE BESYLATE 10 MG PO TABS: 10.0000 mg | ORAL_TABLET | Freq: Every day | ORAL | Status: DC

## 2012-05-13 MED ORDER — PRAVASTATIN SODIUM 40 MG PO TABS: 80.0000 mg | ORAL_TABLET | Freq: Every day | ORAL | Status: DC

## 2012-05-13 MED ORDER — LOSARTAN POTASSIUM 50 MG PO TABS: 50.0000 mg | ORAL_TABLET | Freq: Every day | ORAL | Status: DC

## 2012-05-13 MED ORDER — COLCHICINE 0.6 MG PO TABS: 0.6000 mg | ORAL_TABLET | Freq: Two times a day (BID) | ORAL | Status: DC | PRN

## 2012-05-13 NOTE — Assessment & Plan Note (Signed)
Generally healthy But out of shape Discussed exercise and dietary restraint for his obesity PSA after discussion

## 2012-05-13 NOTE — Assessment & Plan Note (Signed)
BP Readings from Last 3 Encounters:  05/13/12 128/90  02/02/12 116/78  07/07/11 120/84   Good control No changes needed

## 2012-05-13 NOTE — Progress Notes (Signed)
Subjective:    Patient ID: Matthew Doyle, male    DOB: 02/05/1956, 56 y.o.   MRN: [id number]  HPI Here for physical Toe pain continues intermittently Indomethacin not that much help  No other concerns Hard to exercise with the toe----couldn't walk treadmill Has been slowed down at work  Discussed prostate cancer screening  Current Outpatient Prescriptions on File Prior to Visit  Medication Sig Dispense Refill  . aspirin 81 MG tablet Take 162 mg by mouth daily.       Marland Kitchen DISCONTD: amLODipine (NORVASC) 10 MG tablet Take 1 tablet (10 mg total) by mouth daily.  90 tablet  3  . DISCONTD: losartan (COZAAR) 50 MG tablet Take 1 tablet (50 mg total) by mouth daily.  90 tablet  3  . DISCONTD: pravastatin (PRAVACHOL) 40 MG tablet Take 2 tablets (80 mg total) by mouth daily.  60 tablet  11    Allergies  Allergen Reactions  . Lisinopril     REACTION: cough with this and/or benazepril    Past Medical History  Diagnosis Date  . CAD (coronary artery disease)     cardiolite- non reversible defect EF 58%, 3/05.  Myoview stress negative 2/08.  Marland Kitchen Hypertension   . Hyperlipidemia   . Atypical chest pain   . Hemorrhoids   . Colon polyps   . Diverticulosis   . Gout   . Internal hemorrhoids without mention of complication     Past Surgical History  Procedure Date  . Cypher stent 10/04    mid LAD- gupta    Family History  Problem Relation Age of Onset  . Diabetes Father   . Coronary artery disease Father   . Hypertension Brother   . Hypertension Brother     History   Social History  . Marital Status: Married    Spouse Name: N/A    Number of Children: 2  . Years of Education: N/A   Occupational History  . 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 Smoking status: Never Smoker   . Smokeless tobacco: Never Used  . Alcohol Use: No  . Drug Use: No  . Sexually Active: Not on file   Other Topics Concern  . 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       Wears seat belt  HENT: Positive for tinnitus. Negative for hearing loss, congestion, rhinorrhea and dental problem.        Overdue for dentist  Eyes: Negative for visual disturbance.       Vision better No diplopia or unilateral vision loss  Respiratory: Negative for cough, chest tightness and shortness of breath.   Cardiovascular: Negative for chest pain, palpitations and leg swelling.  Gastrointestinal: Negative for nausea, vomiting, abdominal pain, constipation and blood in stool.  Genitourinary: Negative for urgency, frequency and difficulty urinating.       No sexual problems  Musculoskeletal: Positive for arthralgias.       Just the toe  Skin: Negative for rash.       No suspicious lesions  Neurological: Negative for dizziness, syncope, weakness, light-headedness, numbness and headaches.  Hematological: Negative for adenopathy. Does not bruise/bleed easily.  Psychiatric/Behavioral: Negative for disturbed wake/sleep cycle and dysphoric mood. The patient is not nervous/anxious.        Objective:  Physical Exam  Constitutional: He is oriented to person, place, and time. He appears well-developed and well-nourished. No distress.  HENT:  Head: Normocephalic and atraumatic.  Right Ear: External ear normal.  Left Ear: External ear normal.  Mouth/Throat: Oropharynx is clear and moist. No oropharyngeal exudate.  Eyes: Conjunctivae and EOM are normal. Pupils are equal, round, and reactive to light.  Neck: Normal range of motion. Neck supple. No thyromegaly present.  Cardiovascular: Normal rate, regular rhythm, normal heart sounds and intact distal pulses.  Exam reveals no gallop.   No murmur heard. Pulmonary/Chest: Effort normal and breath sounds normal. No respiratory distress. He has no  wheezes. He has no rales.  Abdominal: Soft. There is no tenderness.  Musculoskeletal: Normal range of motion. He exhibits no edema.       Slight tenderness in left 1st MTP  Lymphadenopathy:    He has no cervical adenopathy.  Neurological: He is alert and oriented to person, place, and time. He exhibits normal muscle tone. Coordination normal.  Skin: No rash noted. No erythema.  Psychiatric: He has a normal mood and affect. His behavior is normal. Thought content normal.          Assessment & Plan:

## 2012-05-13 NOTE — Assessment & Plan Note (Signed)
Will try colchicine for ongoing symptoms

## 2012-05-13 NOTE — Assessment & Plan Note (Signed)
Seems to be quiet on ARB, asa, statin

## 2012-05-14 LAB — BASIC METABOLIC PANEL
CO2: 29 mEq/L (ref 19–32)
Calcium: 9.6 mg/dL (ref 8.4–10.5)
GFR: 86.96 mL/min (ref >60.00)
Sodium: 142 mEq/L (ref 135–145)

## 2012-05-14 LAB — CBC WITH DIFFERENTIAL/PLATELET
Basophils Relative: 0.2 % (ref 0.0–3.0)
Hemoglobin: 15.6 g/dL (ref 13.0–17.0)
Lymphocytes Relative: 20.1 % (ref 12.0–46.0)
Monocytes Relative: 7.1 % (ref 3.0–12.0)
Neutro Abs: 7.8 10*3/uL — ABNORMAL HIGH (ref 1.4–7.7)
RBC: 5.46 Mil/uL (ref 4.22–5.81)

## 2012-05-14 LAB — HEPATIC FUNCTION PANEL
AST: 18 U/L (ref 0–37)
Albumin: 4.3 g/dL (ref 3.5–5.2)
Alkaline Phosphatase: 68 U/L (ref 39–117)
Total Protein: 7.6 g/dL (ref 6.0–8.3)

## 2012-05-14 LAB — LIPID PANEL
LDL Cholesterol: 73 mg/dL (ref 0–99)
VLDL: 19 mg/dL (ref 0.0–40.0)

## 2012-05-14 LAB — PSA: PSA: 0.89 ng/mL (ref 0.10–4.00)

## 2012-05-18 ENCOUNTER — Encounter: Payer: Self-pay | Admitting: *Deleted

## 2012-06-15 ENCOUNTER — Ambulatory Visit: Payer: Self-pay

## 2012-06-15 ENCOUNTER — Other Ambulatory Visit: Payer: Self-pay | Admitting: Occupational Medicine

## 2012-06-15 DIAGNOSIS — M25569 Pain in unspecified knee: Secondary | ICD-10-CM

## 2012-12-01 HISTORY — PX: CARDIAC CATHETERIZATION: SHX172

## 2012-12-16 ENCOUNTER — Inpatient Hospital Stay (HOSPITAL_COMMUNITY)
Admission: EM | Admit: 2012-12-16 | Discharge: 2012-12-18 | DRG: 853 | Disposition: A | Discharge status: Home / Self Care | Payer: BC Managed Care – PPO | Attending: Internal Medicine | Admitting: Internal Medicine

## 2012-12-16 ENCOUNTER — Encounter (HOSPITAL_COMMUNITY): Payer: Self-pay | Admitting: *Deleted

## 2012-12-16 DIAGNOSIS — E785 Hyperlipidemia, unspecified: Secondary | ICD-10-CM | POA: Diagnosis present

## 2012-12-16 DIAGNOSIS — Z0000 Encounter for general adult medical examination without abnormal findings: Secondary | ICD-10-CM

## 2012-12-16 DIAGNOSIS — Z860100 Personal history of colon polyps, unspecified: Secondary | ICD-10-CM

## 2012-12-16 DIAGNOSIS — Z8249 Family history of ischemic heart disease and other diseases of the circulatory system: Secondary | ICD-10-CM

## 2012-12-16 DIAGNOSIS — K5730 Diverticulosis of large intestine without perforation or abscess without bleeding: Secondary | ICD-10-CM

## 2012-12-16 DIAGNOSIS — I214 Non-ST elevation (NSTEMI) myocardial infarction: Principal | ICD-10-CM | POA: Diagnosis present

## 2012-12-16 DIAGNOSIS — K648 Other hemorrhoids: Secondary | ICD-10-CM

## 2012-12-16 DIAGNOSIS — R079 Chest pain, unspecified: Secondary | ICD-10-CM | POA: Diagnosis present

## 2012-12-16 DIAGNOSIS — D126 Benign neoplasm of colon, unspecified: Secondary | ICD-10-CM

## 2012-12-16 DIAGNOSIS — I10 Essential (primary) hypertension: Secondary | ICD-10-CM | POA: Diagnosis present

## 2012-12-16 DIAGNOSIS — Z8719 Personal history of other diseases of the digestive system: Secondary | ICD-10-CM

## 2012-12-16 DIAGNOSIS — Z9861 Coronary angioplasty status: Secondary | ICD-10-CM

## 2012-12-16 DIAGNOSIS — Z888 Allergy status to other drugs, medicaments and biological substances status: Secondary | ICD-10-CM

## 2012-12-16 DIAGNOSIS — I2510 Atherosclerotic heart disease of native coronary artery without angina pectoris: Secondary | ICD-10-CM | POA: Diagnosis present

## 2012-12-16 DIAGNOSIS — M109 Gout, unspecified: Secondary | ICD-10-CM | POA: Diagnosis present

## 2012-12-16 NOTE — ED Notes (Addendum)
Per EMS: pt coming from home with c/o substernal chest pain radiating to left arm, mild shortness of breath associated with pain. Pt reports chest pain at 530 pm resolved on it on with out treatment, pt has another sudden onset of chest pain at 930 pm. Pt is A&Ox4, skin warm and dry, respirations equal and unlabored. EKG unremarkable. Family at bedside. Was given 324 asa and 2 nitro. Pain now 0/10

## 2012-12-17 ENCOUNTER — Emergency Department (HOSPITAL_COMMUNITY): Payer: BC Managed Care – PPO

## 2012-12-17 ENCOUNTER — Encounter (HOSPITAL_COMMUNITY): Payer: Self-pay | Admitting: Internal Medicine

## 2012-12-17 ENCOUNTER — Encounter (HOSPITAL_COMMUNITY)
Admission: EM | Disposition: A | Discharge status: Home / Self Care | Payer: Self-pay | Source: Home / Self Care | Attending: Internal Medicine

## 2012-12-17 DIAGNOSIS — I10 Essential (primary) hypertension: Secondary | ICD-10-CM

## 2012-12-17 DIAGNOSIS — R079 Chest pain, unspecified: Secondary | ICD-10-CM | POA: Diagnosis present

## 2012-12-17 DIAGNOSIS — I214 Non-ST elevation (NSTEMI) myocardial infarction: Secondary | ICD-10-CM | POA: Diagnosis present

## 2012-12-17 DIAGNOSIS — I2510 Atherosclerotic heart disease of native coronary artery without angina pectoris: Secondary | ICD-10-CM

## 2012-12-17 DIAGNOSIS — E785 Hyperlipidemia, unspecified: Secondary | ICD-10-CM

## 2012-12-17 HISTORY — PX: LEFT HEART CATHETERIZATION WITH CORONARY ANGIOGRAM: SHX5451

## 2012-12-17 HISTORY — PX: PERCUTANEOUS CORONARY STENT INTERVENTION (PCI-S): SHX5485

## 2012-12-17 LAB — LIPID PANEL
Cholesterol: 146 mg/dL (ref 0–200)
Total CHOL/HDL Ratio: 4.1 RATIO
Triglycerides: 96 mg/dL (ref <150)
VLDL: 19 mg/dL (ref 0–40)

## 2012-12-17 LAB — CBC WITH DIFFERENTIAL/PLATELET
Eosinophils Absolute: 0.2 10*3/uL (ref 0.0–0.7)
Eosinophils Relative: 2 % (ref 0–5)
HCT: 43 % (ref 39.0–52.0)
HCT: 43.5 % (ref 39.0–52.0)
Hemoglobin: 15.1 g/dL (ref 13.0–17.0)
Hemoglobin: 15.5 g/dL (ref 13.0–17.0)
Lymphocytes Relative: 15 % (ref 12–46)
Lymphs Abs: 1.5 10*3/uL (ref 0.7–4.0)
Lymphs Abs: 2.3 10*3/uL (ref 0.7–4.0)
MCH: 28.7 pg (ref 26.0–34.0)
MCH: 29.8 pg (ref 26.0–34.0)
MCV: 82.7 fL (ref 78.0–100.0)
Monocytes Absolute: 0.8 10*3/uL (ref 0.1–1.0)
Monocytes Absolute: 1.1 10*3/uL — ABNORMAL HIGH (ref 0.1–1.0)
Monocytes Relative: 11 % (ref 3–12)
Monocytes Relative: 9 % (ref 3–12)
Neutro Abs: 5.3 10*3/uL (ref 1.7–7.7)
Neutrophils Relative %: 62 % (ref 43–77)
Platelets: 235 10*3/uL (ref 150–400)
RBC: 5.2 MIL/uL (ref 4.22–5.81)
RBC: 5.27 MIL/uL (ref 4.22–5.81)

## 2012-12-17 LAB — POCT I-STAT, CHEM 8
BUN: 14 mg/dL (ref 6–23)
Creatinine, Ser: 0.9 mg/dL (ref 0.50–1.35)
Potassium: 3.5 mEq/L (ref 3.5–5.1)
Sodium: 142 mEq/L (ref 135–145)

## 2012-12-17 LAB — COMPREHENSIVE METABOLIC PANEL
AST: 16 U/L (ref 0–37)
CO2: 26 mEq/L (ref 19–32)
Calcium: 9.2 mg/dL (ref 8.4–10.5)
Creatinine, Ser: 0.82 mg/dL (ref 0.50–1.35)
GFR calc Af Amer: >90 mL/min (ref >90)
GFR calc non Af Amer: >90 mL/min (ref >90)
Sodium: 141 mEq/L (ref 135–145)
Total Protein: 7.3 g/dL (ref 6.0–8.3)

## 2012-12-17 LAB — TROPONIN I: Troponin I: <0.3 ng/mL (ref <0.30)

## 2012-12-17 LAB — POCT ACTIVATED CLOTTING TIME: Activated Clotting Time: 932 seconds

## 2012-12-17 LAB — POCT I-STAT TROPONIN I

## 2012-12-17 SURGERY — LEFT HEART CATHETERIZATION WITH CORONARY ANGIOGRAM
Anesthesia: LOCAL | Site: Groin | Laterality: Right

## 2012-12-17 MED ORDER — SODIUM CHLORIDE 0.9 % IJ SOLN
3.0000 mL | Freq: Two times a day (BID) | INTRAMUSCULAR | Status: DC
  Administered 2012-12-17 (×2): 3 mL via INTRAVENOUS

## 2012-12-17 MED ORDER — AMLODIPINE BESYLATE 10 MG PO TABS
10.0000 mg | ORAL_TABLET | Freq: Every day | ORAL | Status: DC
  Administered 2012-12-17 – 2012-12-18 (×2): 10 mg via ORAL
  Filled 2012-12-17 (×2): qty 1

## 2012-12-17 MED ORDER — ASPIRIN 81 MG PO CHEW
81.0000 mg | CHEWABLE_TABLET | Freq: Every day | ORAL | Status: DC
  Administered 2012-12-18: 81 mg via ORAL
  Filled 2012-12-17: qty 1

## 2012-12-17 MED ORDER — MIDAZOLAM HCL 2 MG/2ML IJ SOLN
INTRAMUSCULAR | Status: AC
  Filled 2012-12-17: qty 2

## 2012-12-17 MED ORDER — NITROGLYCERIN 0.2 MG/ML ON CALL CATH LAB
INTRAVENOUS | Status: AC
  Filled 2012-12-17: qty 1

## 2012-12-17 MED ORDER — ACETAMINOPHEN 650 MG RE SUPP: 650.0000 mg | Freq: Four times a day (QID) | RECTAL | Status: DC | PRN

## 2012-12-17 MED ORDER — HEPARIN BOLUS VIA INFUSION
2000.0000 [IU] | Freq: Once | INTRAVENOUS | Status: AC
  Administered 2012-12-17: 2000 [IU] via INTRAVENOUS
  Filled 2012-12-17: qty 2000

## 2012-12-17 MED ORDER — PRASUGREL HCL 10 MG PO TABS
ORAL_TABLET | ORAL | Status: AC
  Filled 2012-12-17: qty 6

## 2012-12-17 MED ORDER — SODIUM CHLORIDE 0.9 % IV SOLN
1.7500 mg/kg/h | INTRAVENOUS | Status: DC
  Administered 2012-12-17: 1.75 mg/kg/h via INTRAVENOUS
  Filled 2012-12-17: qty 250

## 2012-12-17 MED ORDER — ONDANSETRON HCL 4 MG/2ML IJ SOLN: 4.0000 mg | Freq: Four times a day (QID) | INTRAMUSCULAR | Status: DC | PRN

## 2012-12-17 MED ORDER — BIVALIRUDIN 250 MG IV SOLR
INTRAVENOUS | Status: AC
  Filled 2012-12-17: qty 250

## 2012-12-17 MED ORDER — PRASUGREL HCL 10 MG PO TABS
10.0000 mg | ORAL_TABLET | Freq: Every day | ORAL | Status: DC
  Administered 2012-12-18: 10 mg via ORAL
  Filled 2012-12-17: qty 1

## 2012-12-17 MED ORDER — ONDANSETRON HCL 4 MG PO TABS: 4.0000 mg | ORAL_TABLET | Freq: Four times a day (QID) | ORAL | Status: DC | PRN

## 2012-12-17 MED ORDER — SIMVASTATIN 20 MG PO TABS
20.0000 mg | ORAL_TABLET | Freq: Every day | ORAL | Status: DC
  Filled 2012-12-17: qty 1

## 2012-12-17 MED ORDER — SODIUM CHLORIDE 0.9 % IV SOLN
1.0000 mL/kg/h | INTRAVENOUS | Status: AC
  Administered 2012-12-18: 1 mL/kg/h via INTRAVENOUS

## 2012-12-17 MED ORDER — FENTANYL CITRATE 0.05 MG/ML IJ SOLN
INTRAMUSCULAR | Status: AC
  Filled 2012-12-17: qty 2

## 2012-12-17 MED ORDER — ATORVASTATIN CALCIUM 80 MG PO TABS
80.0000 mg | ORAL_TABLET | Freq: Every day | ORAL | Status: DC
  Filled 2012-12-17 (×2): qty 1

## 2012-12-17 MED ORDER — LIDOCAINE HCL (PF) 1 % IJ SOLN
INTRAMUSCULAR | Status: AC
  Filled 2012-12-17: qty 30

## 2012-12-17 MED ORDER — ASPIRIN EC 325 MG PO TBEC
325.0000 mg | DELAYED_RELEASE_TABLET | Freq: Every day | ORAL | Status: DC
  Administered 2012-12-17: 325 mg via ORAL
  Filled 2012-12-17: qty 1

## 2012-12-17 MED ORDER — FENTANYL CITRATE 0.05 MG/ML IJ SOLN
50.0000 ug | Freq: Once | INTRAMUSCULAR | Status: AC
  Administered 2012-12-17: 50 ug via INTRAVENOUS
  Filled 2012-12-17: qty 2

## 2012-12-17 MED ORDER — SODIUM CHLORIDE 0.9 % IV SOLN
1.0000 mL/kg/h | INTRAVENOUS | Status: DC
  Administered 2012-12-17: 1 mL/kg/h via INTRAVENOUS

## 2012-12-17 MED ORDER — HEPARIN (PORCINE) IN NACL 100-0.45 UNIT/ML-% IJ SOLN
1600.0000 [IU]/h | INTRAMUSCULAR | Status: DC
  Administered 2012-12-17 (×2): 1600 [IU]/h via INTRAVENOUS
  Filled 2012-12-17 (×2): qty 250

## 2012-12-17 MED ORDER — HEPARIN (PORCINE) IN NACL 100-0.45 UNIT/ML-% IJ SOLN
1200.0000 [IU]/h | Freq: Once | INTRAMUSCULAR | Status: AC
  Administered 2012-12-17: 1200 [IU]/h via INTRAVENOUS
  Filled 2012-12-17: qty 250

## 2012-12-17 MED ORDER — ACETAMINOPHEN 325 MG PO TABS
650.0000 mg | ORAL_TABLET | Freq: Four times a day (QID) | ORAL | Status: DC | PRN
  Administered 2012-12-17 (×2): 650 mg via ORAL
  Filled 2012-12-17 (×2): qty 2

## 2012-12-17 MED ORDER — SODIUM CHLORIDE 0.9 % IV SOLN: INTRAVENOUS | Status: DC

## 2012-12-17 MED ORDER — HEPARIN (PORCINE) IN NACL 2-0.9 UNIT/ML-% IJ SOLN
INTRAMUSCULAR | Status: AC
  Filled 2012-12-17: qty 1000

## 2012-12-17 MED ORDER — HEPARIN (PORCINE) IN NACL 2-0.9 UNIT/ML-% IJ SOLN
INTRAMUSCULAR | Status: AC
  Filled 2012-12-17: qty 500

## 2012-12-17 MED ORDER — NITROGLYCERIN 0.4 MG/HR TD PT24
0.4000 mg | MEDICATED_PATCH | Freq: Every day | TRANSDERMAL | Status: DC
  Administered 2012-12-17 – 2012-12-18 (×2): 0.4 mg via TRANSDERMAL
  Filled 2012-12-17 (×2): qty 1

## 2012-12-17 MED ORDER — MORPHINE SULFATE 2 MG/ML IJ SOLN: 2.0000 mg | INTRAMUSCULAR | Status: DC | PRN

## 2012-12-17 MED ORDER — LOSARTAN POTASSIUM 50 MG PO TABS
50.0000 mg | ORAL_TABLET | Freq: Every day | ORAL | Status: DC
  Administered 2012-12-17 – 2012-12-18 (×2): 50 mg via ORAL
  Filled 2012-12-17 (×2): qty 1

## 2012-12-17 NOTE — ED Provider Notes (Signed)
History     CSN: [id number]  Arrival date & time 12/16/12  2317   First MD Initiated Contact with Patient 12/16/12 2340      Chief Complaint  Patient presents with  . Chest Pain    (Consider location/radiation/quality/duration/timing/severity/associated sxs/prior treatment) Patient is a 57 y.o. male presenting with chest pain. The history is provided by the patient and the spouse. No language interpreter was used.  Chest Pain The chest pain began 5 - 7 days ago. Chest pain occurs intermittently. The chest pain is worsening (current episode since 930). Associated with: none. At its most intense, the pain is at 8/10. The pain is currently at 3/10. The severity of the pain is severe. The quality of the pain is described as pressure-like. The pain radiates to the left shoulder. Primary symptoms include shortness of breath. Pertinent negatives for primary symptoms include no palpitations, no nausea and no vomiting.  Pertinent negatives for associated symptoms include no diaphoresis. He tried aspirin and nitroglycerin for the symptoms. Risk factors include male gender.  His past medical history is significant for CAD.  Procedure history is positive for cardiac catheterization.     Past Medical History  Diagnosis Date  . CAD (coronary artery disease)     cardiolite- non reversible defect EF 58%, 3/05.  Myoview stress negative 2/08.  Marland Kitchen Hypertension   . Hyperlipidemia   . Atypical chest pain   . Hemorrhoids   . Colon polyps   . Diverticulosis   . Gout   . Internal hemorrhoids without mention of complication     Past Surgical History  Procedure Date  . Cypher stent 10/04    mid LAD- gupta    Family History  Problem Relation Age of Onset  . Diabetes Father   . Coronary artery disease Father   . Hypertension Brother   . Hypertension Brother     History  Substance Use Topics  . Smoking status: Never Smoker   . Smokeless tobacco: Never Used  . Alcohol Use: No       Review of Systems  Constitutional: Negative for diaphoresis.  HENT: Negative for neck pain.   Respiratory: Positive for shortness of breath.   Cardiovascular: Positive for chest pain. Negative for palpitations and leg swelling.  Gastrointestinal: Negative for nausea and vomiting.  All other systems reviewed and are negative.    Allergies  Lisinopril  Home Medications   Current Outpatient Rx  Name  Route  Sig  Dispense  Refill  . AMLODIPINE BESYLATE 10 MG PO TABS   Oral   Take 1 tablet (10 mg total) by mouth daily.   30 tablet   11   . ASPIRIN 81 MG PO TABS   Oral   Take 162 mg by mouth daily.          Marland Kitchen LOSARTAN POTASSIUM 50 MG PO TABS   Oral   Take 1 tablet (50 mg total) by mouth daily.   30 tablet   11   . PRAVASTATIN SODIUM 40 MG PO TABS   Oral   Take 2 tablets (80 mg total) by mouth daily.   60 tablet   11     BP 177/76  Resp 18  SpO2 96%  Physical Exam  Constitutional: He is oriented to person, place, and time. He appears well-developed and well-nourished. No distress.  HENT:  Head: Normocephalic and atraumatic.  Mouth/Throat: Oropharynx is clear and moist.  Eyes: Conjunctivae normal are normal. Pupils are equal, round, and reactive  to light.  Neck: Normal range of motion. Neck supple.  Cardiovascular: Normal rate, regular rhythm and intact distal pulses.   Pulmonary/Chest: Effort normal and breath sounds normal. He has no wheezes.  Abdominal: Soft. Bowel sounds are normal. There is no tenderness. There is no rebound and no guarding.  Musculoskeletal: Normal range of motion.  Neurological: He is alert and oriented to person, place, and time.  Skin: Skin is warm and dry.  Psychiatric: He has a normal mood and affect.    ED Course  Procedures (including critical care time)   Labs Reviewed  CBC WITH DIFFERENTIAL   No results found.   No diagnosis found.    MDM   Date: 12/17/2012  Rate: 64  Rhythm: normal sinus rhythm  QRS  Axis: normal  Intervals: normal  ST/T Wave abnormalities: normal  Conduction Disutrbances: none  Narrative Interpretation: unremarkable     Symptoms concerning for unstable angina, per Dr. Jearld Pies can be admitted to medicine on heparin gtt.  [facility] contacted regarding admission       Author Hatlestad K Avantae Bither-Rasch, MD 12/17/12 [phone]

## 2012-12-17 NOTE — CV Procedure (Signed)
   Cardiac Catheterization Procedure Note  Name: Matthew Doyle MRN: [id number] DOB: [birth year]  Procedure: Left Heart Cath, Selective Coronary Angiography, LV angiography  Indication: NSTEMI   Procedural Details: The right wrist was prepped, draped, and anesthetized with 1% lidocaine. Using the modified Seldinger technique, a 5 French sheath was introduced into the right radial artery. 3 mg of verapamil was administered through the sheath, weight-based unfractionated heparin was administered intravenously. Standard Judkins catheters were used for selective coronary angiography and left ventriculography. Catheter exchanges were performed over an exchange length guidewire. There were no immediate procedural complications. A TR band was used for radial hemostasis at the completion of the procedure.  The patient was transferred to the post catheterization recovery area for further monitoring.  Procedural Findings: Hemodynamics: AO 114/75 LV 106/19  Coronary angiography: Coronary dominance: right  Left mainstem: Short vessel without significant disease.   Left anterior descending (LAD): Moderate to large D1 with 50% ostial stenosis.  30% proximal LAD stenosis just beyond D1 and proximal to LAD stent.  LAD stent with 20% in-stent restenosis.  Small D2 originates from stented segment of LAD. It has 90% ostial stenosis (this was seen on prior cath as well per report).    Left circumflex (LCx): Luminal irregularities in the LCx.  There was a small OM1 with probable 70% ostial stenosis.  OM2 was a moderate vessel with 80% ostial and 99% proximal stenosis.  There was a large PLOM with luminal irregularities.   Right coronary artery (RCA): Luminal irregularities in the RCA.  Large PLV branch with 30% ostial stenosis.  The PDA was relatively small and trifurcated early.  The middle vessel of the trifurcation was occluded.  It was filled by faint left to right collaterals.   Left ventriculography:  Left ventricular systolic function is normal, LVEF is estimated at 55%, there was apical inferior hypokinesis.   Final Conclusions:  Severe ostial and proximal stenosis in moderate OM2, occluded central branch of trifurcated PDA (small caliber vessel).  Reviewed films with Dr. [place], will plan PCI to OM2.   [name] 12/17/2012, 4:30 PM

## 2012-12-17 NOTE — Interval H&P Note (Signed)
History and Physical Interval Note:  12/17/2012 3:43 PM  Matthew Doyle  has presented today for surgery, with the diagnosis of chest pain  The various methods of treatment have been discussed with the patient and family. After consideration of risks, benefits and other options for treatment, the patient has consented to  Procedure(s) (LRB) with comments: LEFT HEART CATHETERIZATION WITH CORONARY ANGIOGRAM (N/A) as a surgical intervention .  The patient's history has been reviewed, patient examined, no change in status, stable for surgery.  I have reviewed the patient's chart and labs.  Questions were answered to the patient's satisfaction.     [name]

## 2012-12-17 NOTE — Progress Notes (Signed)
Patient admitted earlier today for CP. Has a h/o CAD. Is on a heparin drip. We are awaiting cardiology recommendations as to further work up this hospitalization Patient continues to have intermittent CP. Troponin is now on the rise to 0.52. Suspect will need a cardiac cath. Will continue to follow.  Peggye Pitt, MD [facility] Pager: [phone]

## 2012-12-17 NOTE — Progress Notes (Signed)
Received pt from ED, VSS, denies chest pain, oriented to room, did fall teaching, safety video, admission completed.  Pt resting, wife in room.

## 2012-12-17 NOTE — H&P (View-Only) (Signed)
Patient admitted earlier today for CP. Has a h/o CAD. Is on a heparin drip. We are awaiting cardiology recommendations as to further work up this hospitalization Patient continues to have intermittent CP. Troponin is now on the rise to 0.52. Suspect will need a cardiac cath. Will continue to follow.  Matthew Hernandez, MD [facility] Pager: [phone]  

## 2012-12-17 NOTE — Progress Notes (Signed)
ANTICOAGULATION CONSULT NOTE - Initial Consult  Pharmacy Consult for heparin Indication: chest pain/ACS  Allergies  Allergen Reactions  . Lisinopril     REACTION: cough with this and/or benazepril    Patient Measurements: Height: 6' (182.9 cm) Weight: 245 lb (111.131 kg) IBW/kg (Calculated) : 77.6  Heparin Dosing Weight: 100kg  Vital Signs: BP: 111/73 mmHg (01/17 0230) Pulse Rate: 57  (01/17 0230)  Labs:  Basename 12/17/12 0016 12/17/12 0001  HGB 15.3 15.5  HCT 45.0 43.0  PLT -- 235  APTT -- --  LABPROT -- --  INR -- --  HEPARINUNFRC -- --  CREATININE 0.90 --  CKTOTAL -- --  CKMB -- --  TROPONINI -- --    Estimated Creatinine Clearance: 116.6 ml/min (by C-G formula based on Cr of 0.9).   Medical History: Past Medical History  Diagnosis Date  . CAD (coronary artery disease)     cardiolite- non reversible defect EF 58%, 3/05.  Myoview stress negative 2/08.  Marland Kitchen Hypertension   . Hyperlipidemia   . Atypical chest pain   . Hemorrhoids   . Colon polyps   . Diverticulosis   . Gout   . Internal hemorrhoids without mention of complication     Assessment: 57yo male c/p substernal CP radiating to left arm and associated with SOB, episode initially spontaneously resolved but then began again several hours later, initial i-stat troponin negative, to begin heparin.  Goal of Therapy:  Heparin level 0.3-0.7 units/ml Monitor platelets by anticoagulation protocol: Yes   Plan:  EDMD started heparin gtt at 1200 units/hr; will continue at current rate and monitor heparin levels and CBC.  Colleen Can PharmD BCPS 12/17/2012,2:43 AM

## 2012-12-17 NOTE — H&P (Signed)
Matthew Doyle is an 57 y.o. male.  Patient was seen and examined on December 17, 2012. PCP - Dr. Tillman Abide.  Chief Complaint: Chest pain. HPI: 57 year old male with history of CAD status post stenting, hypertension and hyperlipidemia presented to the ER because of chest pain. Chest pain has been ongoing for last 2 weeks off and on. But last evening patient is to make a more persistent. Chest diminished his left arm with mild shortness of breath but denies any associated diaphoresis nausea vomiting or abdominal pain. In the ER EKG and chest x-ray were unremarkable. Cardiac enzymes have been negative. On-call cardiologist Dr. Shirlee Latch was consulted by the ER physician at this time was advised to start heparin and patient will be admitted for further management.  Past Medical History  Diagnosis Date  . CAD (coronary artery disease)     cardiolite- non reversible defect EF 58%, 3/05.  Myoview stress negative 2/08.  Marland Kitchen Hypertension   . Hyperlipidemia   . Atypical chest pain   . Hemorrhoids   . Colon polyps   . Diverticulosis   . Gout   . Internal hemorrhoids without mention of complication     Past Surgical History  Procedure Date  . Cypher stent 10/04    mid LAD- gupta    Family History  Problem Relation Age of Onset  . Diabetes Father   . Coronary artery disease Father   . Hypertension Brother   . Hypertension Brother    Social History:  reports that he has never smoked. He has never used smokeless tobacco. He reports that he does not drink alcohol or use illicit drugs.  Allergies:  Allergies  Allergen Reactions  . Lisinopril     REACTION: cough with this and/or benazepril     (Not in a hospital admission)  Results for orders placed during the hospital encounter of 12/16/12 (from the past 48 hour(s))  CBC WITH DIFFERENTIAL     Status: Abnormal   Collection Time   12/17/12 12:01 AM      Component Value Range Comment   WBC 9.5  4.0 - 10.5 K/uL    RBC 5.20  4.22 - 5.81  MIL/uL    Hemoglobin 15.5  13.0 - 17.0 g/dL    HCT 40.9  81.1 - 91.4 %    MCV 82.7  78.0 - 100.0 fL    MCH 29.8  26.0 - 34.0 pg    MCHC 36.0  30.0 - 36.0 g/dL    RDW 78.2  95.6 - 21.3 %    Platelets 235  150 - 400 K/uL    Neutrophils Relative 71  43 - 77 %    Neutro Abs 6.8  1.7 - 7.7 K/uL    Lymphocytes Relative 15  12 - 46 %    Lymphs Abs 1.5  0.7 - 4.0 K/uL    Monocytes Relative 11  3 - 12 %    Monocytes Absolute 1.1 (*) 0.1 - 1.0 K/uL    Eosinophils Relative 2  0 - 5 %    Eosinophils Absolute 0.2  0.0 - 0.7 K/uL    Basophils Relative 0  0 - 1 %    Basophils Absolute 0.0  0.0 - 0.1 K/uL   POCT I-STAT TROPONIN I     Status: Normal   Collection Time   12/17/12 12:14 AM      Component Value Range Comment   Troponin i, poc 0.00  0.00 - 0.08 ng/mL    Comment  3            POCT I-STAT, CHEM 8     Status: Abnormal   Collection Time   12/17/12 12:16 AM      Component Value Range Comment   Sodium 142  135 - 145 mEq/L    Potassium 3.5  3.5 - 5.1 mEq/L    Chloride 103  96 - 112 mEq/L    BUN 14  6 - 23 mg/dL    Creatinine, Ser 2.95  0.50 - 1.35 mg/dL    Glucose, Bld 621 (*) 70 - 99 mg/dL    Calcium, Ion 3.08  6.57 - 1.23 mmol/L    TCO2 24  0 - 100 mmol/L    Hemoglobin 15.3  13.0 - 17.0 g/dL    HCT 84.6  96.2 - 95.2 %    Dg Chest 2 View  12/17/2012  *RADIOLOGY REPORT*  Clinical Data: Central and left-sided chest pain.  Shortness of breath.  CHEST - 2 VIEW  Comparison: None.  Findings: Shallow inspiration.  Borderline heart size with normal pulmonary vascularity.  No blunting of costophrenic angles.  No pneumothorax.  No focal consolidation.  Tortuous aorta.  Mild degenerative changes in the spine.  IMPRESSION: Shallow inspiration.  No evidence of active pulmonary disease.   Original Report Authenticated By: Burman Nieves, M.D.     Review of Systems  Constitutional: Negative.   HENT: Negative.   Eyes: Negative.   Respiratory: Negative.   Cardiovascular: Positive for chest pain.    Gastrointestinal: Negative.   Genitourinary: Negative.   Musculoskeletal: Negative.   Skin: Negative.   Neurological: Negative.   Endo/Heme/Allergies: Negative.   Psychiatric/Behavioral: Negative.     Blood pressure 111/73, pulse 61, resp. rate 20, height 6' (1.829 m), weight 111.131 kg (245 lb), SpO2 94.00%. Physical Exam  Constitutional: He is oriented to person, place, and time. He appears well-developed and well-nourished. No distress.  HENT:  Head: Normocephalic and atraumatic.  Right Ear: External ear normal.  Left Ear: External ear normal.  Nose: Nose normal.  Mouth/Throat: Oropharynx is clear and moist. No oropharyngeal exudate.  Eyes: Conjunctivae normal are normal. Pupils are equal, round, and reactive to light. Right eye exhibits no discharge. Left eye exhibits no discharge. No scleral icterus.  Neck: Normal range of motion. Neck supple.  Cardiovascular: Normal rate and regular rhythm.   Respiratory: Effort normal and breath sounds normal. No respiratory distress. He has no wheezes. He has no rales.  GI: Soft. Bowel sounds are normal. He exhibits no distension. There is no tenderness. There is no rebound.  Musculoskeletal: He exhibits no edema and no tenderness.  Neurological: He is alert and oriented to person, place, and time.  Skin: Skin is warm and dry. He is not diaphoretic.  Psychiatric: His behavior is normal.     Assessment/Plan #1. Chest pain with history of CAD status post stenting concerning for unstable angina - continue with IV heparin infusion. Aspirin. Nitroglycerin paste. Patient's heart rate is on the lower side so we will hold off beta blockers for now. Patient is to continue in anticipation of possible procedures. Further recommendations per cardiology. #2. Hypertension - continue present medications. #3. Hyperlipidemia - continue present medications.  CODE STATUS - full code.  KAKRAKANDY,ARSHAD N. 12/17/2012, 2:04 AM

## 2012-12-17 NOTE — Progress Notes (Signed)
CRITICAL VALUE ALERT  Critical value received:  Troponin 0.52  Date of notification:  12/17/12  Time of notification:  1046  Critical value read back:yes  Nurse who received alert:  Lonia Blood, RN  MD notified (1st page):  Dr. Ardyth Harps  Time of first page:  1047  MD notified (2nd page):  Time of second page:  Responding MD:  Dr. Ardyth Harps   Time MD responded:  [phone]

## 2012-12-17 NOTE — CV Procedure (Signed)
   CARDIAC CATH NOTE  Name: Matthew Doyle MRN: [id number] DOB: [birth year]  Procedure: PTCA and stenting of the first Obtuse marginal.  Indication: 57 year old white male with history of coronary disease presents with a non-ST elevation myocardial infarction. He had prior stenting of the LAD. Diagnostic angiogram today demonstrates continued patency of the LAD stent. The PDA is occluded with left to right collaterals. This is a small branch. There is a 90% stenosis of the proximal first obtuse marginal vessel involving the ostium. The takeoff of the obtuse marginal vessel is perpendicular to the left circumflex.  Procedural Details: The right wrist was prepped, draped, and anesthetized with 1% lidocaine. Using the modified Seldinger technique, a 6 Fr sheath was exchanged into the radial artery.  Weight-based bivalirudin was given for anticoagulation. Effient 60 mg was given orally. Once a therapeutic ACT was achieved, a 6 [place] left Voda 3.5 guide catheter was inserted.  A pro-water coronary guidewire was used to cross the lesion.  The lesion was difficult to cross with a balloon. The lesion was predilated with a 1.5 mm balloon. We then upgraded to a 2.25 mm balloon. Finally, we dilated the lesion with a 2.5 x 10 mm angiosculpt balloon. The lesion was then stented with a 2.25  millimeter Promus premier stent. We were careful to place the proximal edge of the stent at the ostium as much as possible. The stent was postdilated with a 2.5 mm noncompliant balloon.  Following PCI, there was 0% residual stenosis and TIMI-3 flow. Final angiography confirmed an excellent result. The patient tolerated the procedure well. There were no immediate procedural complications. A TR band was used for radial hemostasis. The patient was transferred to the post catheterization recovery area for further monitoring.  Lesion Data: Vessel: First obtuse marginal vessel Percent stenosis (pre): 90% TIMI-flow (pre):  3 Stent:   2.25 x 16 mm Promus premier Percent stenosis (post): 0% TIMI-flow (post): 3  Conclusions: Successful and coronary stenting of the first obtuse marginal vessel with a drug-eluting stent.  Recommendations: Continue dual antiplatelet therapy for one year.  Theron Arista [employer] 12/17/2012, 5:32 PM

## 2012-12-17 NOTE — Progress Notes (Signed)
Hep gtt turned off at this time in preparation for cardiac cath.

## 2012-12-17 NOTE — Progress Notes (Signed)
Dr. Ardyth Harps paged at this time to make aware of troponin result; Cardiology PA to see pt this AM; Hep gtt infusing; pt still NPO; will cont. To monitor.

## 2012-12-17 NOTE — Progress Notes (Signed)
Consent obtained for cardiac cath; R wrist and bil groins prepped; pt opted to not watch video as he has had this done in the past; wife at bedside; will cont. To monitor.

## 2012-12-17 NOTE — ED Notes (Signed)
Patient transported to X-ray 

## 2012-12-17 NOTE — Consult Note (Signed)
CARDIOLOGY CONSULT NOTE  Patient ID: Matthew Doyle MRN: [id number], DOB/AGE: 08-23-1956   Admit date: 12/16/2012 Date of Consult: 12/17/2012  Primary Physician: Tillman Abide, MD Primary Cardiologist: New - previously seen by Dr. Genevieve Norlander. Pulsipher/Dr. Jenne Campus ([facility])/[facility].  Pt. Profile  57 y/o male with h/o CAD who was admitted early this AM with recurrent chest pain.   Problem List  Past Medical History  Diagnosis Date  . CAD (coronary artery disease)     a. 09/2003 Cath/PCI: LAD 80%->3.0x23 Cypher DES;  b. 03/2004 Cath: LM nl, LAD 20p, 52m, patent stent, D1 25, D2 small, 80/jailed, LCX 46m, OM1 nl, OM2 20, RCA 40-50p, PDA 11m;  c. 09/2007 Cardiolite: non-ischemic.  Marland Kitchen Hypertension   . Hyperlipidemia   . Atypical chest pain   . Hemorrhoids   . Colon polyps   . Diverticulosis   . Gout   . Internal hemorrhoids without mention of complication     Past Surgical History  Procedure Date  . Cypher stent 10/04    mid LAD- gupta    Allergies  Allergies  Allergen Reactions  . Lisinopril     REACTION: cough with this and/or benazepril   HPI   57 y/o male with the above problem list.  He is s/p prior stenting of the LAD with nonobs cath in 2005 and neg MV in 2008.  He has not been seen by cardiology in 2 years as every cardiologist he has ever had, had left town (Gupta->Pulsipher->McQueen->Gollan).  That said, he had been doing well until 2 wks ago when he began to experience intermittent 4/10 exertional substernal chest pressure associated with dyspnea that lasts for a few minutes and resolves with rest. He had also been feeling generally fatigued and been sleeping more than normal. His wife states he often falls asleep or rests as soon as he comes home for work, and only gets up for dinner. He had a cough last week, but it is now gone. He thought that his chest pain originally had been due to having a cold. At 5 pm last night the pain worsened to 8/10, with radiation to his  left shoulder and arm, lasting for 15 minutes. He had pain again at 9 pm that was 8/10 and would not go away. It is associated with dyspnea and he felt cold. He denies palpitations, nausea, vomiting, and diaphoresis.  EMS he was taken to the ED where he received ASA, 2 nitroglycerin, and fentanyl 50 mcg injection, which helped relieve the pain, though he now notes that it has persisted @ a low level all night/day.  Though ECG and initial troponin were nl, troponin has now risen to 0.52.   Inpatient Medications    . amLODipine  10 mg Oral Daily  . aspirin EC  325 mg Oral Daily  . losartan  50 mg Oral Daily  . nitroGLYCERIN  0.4 mg Transdermal Daily  . simvastatin  20 mg Oral q1800  . sodium chloride  3 mL Intravenous Q12H   Family History Family History  Problem Relation Age of Onset  . Diabetes Father   . Hypertension Brother   . Hypertension Brother   . Arrhythmia Brother   . Heart attack Paternal Uncle     Age 21  . Heart attack Maternal Uncle     Age 61  . Coronary artery disease Father     CABG - 5 vessel 15 yr ago  . Coronary artery disease Maternal Grandfather   . Heart attack  Maternal Grandfather     Died in 63s  . Throat cancer Maternal Grandfather     Social History History   Social History  . Marital Status: Married    Spouse Name: N/A    Number of Children: 2  . Years of Education: N/A   Occupational History  . 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 Smoking status: Never Smoker   . Smokeless tobacco: Never Used  . Alcohol Use: No  . Drug Use: No  . Sexually Active: Not on file   Other Topics Concern  . 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  Cardiovascular:  +++ chest pain & dyspnea on exertion.  No edema, orthopnea, palpitations,  paroxysmal nocturnal dyspnea. Dermatological: No rash, lesions/masses Respiratory: No cough, +++ dyspnea Urologic: No hematuria, dysuria Abdominal:   No nausea, vomiting, diarrhea, bright red blood per rectum, melena, or hematemesis Neurologic:  No visual changes, wkns, changes in mental status. All other systems reviewed and are otherwise negative except as noted above.  Physical Exam  Blood pressure 127/78, pulse 62, temperature 98.4 F (36.9 C), temperature source Oral, resp. rate 20, height 6' (1.829 m), weight 248 lb (112.492 kg), SpO2 97.00%.  General: Pleasant male laying in bed in NAD Psych: Normal affect. Neuro: Alert and oriented X 3. Moves all extremities spontaneously. HEENT: Normal  Neck: Supple without bruits or JVD. Lungs:  Resp regular and unlabored, CTA. Heart: RRR no s3, s4, or murmurs. Abdomen: Soft, non-tender, non-distended, BS + x 4.  Extremities: No clubbing, cyanosis or edema. DP/PT/Radials 2+ and equal bilaterally.  Labs  [facility] 12/17/12 0918 12/17/12 0225  CKTOTAL -- --  CKMB -- --  TROPONINI 0.52* <0.30   Lab Results  Component Value Date   WBC 8.5 12/17/2012   HGB 15.1 12/17/2012   HCT 43.5 12/17/2012   MCV 82.5 12/17/2012   PLT 236 12/17/2012     Lab 12/17/12 0913  NA 141  K 3.7  CL 104  CO2 26  BUN 13  CREATININE 0.82  CALCIUM 9.2  PROT 7.3  BILITOT 0.6  ALKPHOS 68  ALT 17  AST 16  GLUCOSE 106*   Lab Results  Component Value Date   CHOL 146 12/17/2012   HDL 36* 12/17/2012   LDLCALC 91 12/17/2012   TRIG 96 12/17/2012   Radiology/Studies Dg Chest 2 View 12/17/2012  *RADIOLOGY REPORT*  Clinical Data: Central and left-sided chest pain.  Shortness of breath.  CHEST - 2 VIEW  Comparison: None.  Findings: Shallow inspiration.  Borderline heart size with normal pulmonary vascularity.  No blunting of costophrenic angles.  No pneumothorax.  No focal consolidation.  Tortuous aorta.  Mild degenerative changes in the spine.  IMPRESSION: Shallow  inspiration.  No evidence of active pulmonary disease.   Original Report Authenticated By: Burman Nieves, M.D.    ECG RSR, 64, no acute st/t changes.   ASSESSMENT AND PLAN 1.  NSTEMI/CAD - Pt presents with a 2 wk h/o progressive exertional angina and rest angina last night.  He now has an elevated troponin and continues to have mild chest pain.  Agree with heparin, ASA, statin (change to higher potency), nitroglycerin.  Will add low-dose bb.  Plan cath this afternoon. Will provide sl NTG and MSO4 prn while we  await cath.  2.  Hypertention - stable.  continue amlodipine and losartan therapy.  Adding bb as above.  3.  Hyperlipidemia - continue statin therapy.  Signed, Caroline More, PA-S2  Nicolasa Ducking, NP 12/17/2012, 2:16 PM  History and all data above reviewed.  Patient examined.  I agree with the findings as above.  The patient presents for evaluation of chest pain similar to previous angina.  He also has left arm pain.  He has had this with minimal exertion and at rest.  He does report decreased exercise tolerance over the past week.  EKG is non acute.  However, troponin is mildly elevated.  The patient exam reveals COR:RRR  ,  Lungs: Clear  ,  Abd: Positive bowel sounds, no rebound no guarding, Ext No edema  .  All available labs, radiology testing, previous records reviewed. Agree with documented assessment and plan. NQWMI.  Needs cardiac cath.  The patient understands that risks included but are not limited to stroke (1 in 1000), death (1 in 1000), kidney failure [usually temporary] (1 in 500), bleeding (1 in 200), allergic reaction [possibly serious] (1 in 200).  The patient understands and agrees to proceed.   I have cancelled the echo and we will re order this if we think we need it after the cath.    Matthew Doyle  2:34 PM  12/17/2012

## 2012-12-17 NOTE — Progress Notes (Signed)
ANTICOAGULATION CONSULT NOTE - follow up  Pharmacy Consult for heparin Indication: chest pain/ACS  Allergies  Allergen Reactions  . Lisinopril     REACTION: cough with this and/or benazepril    Patient Measurements: Height: 6' (182.9 cm) Weight: 248 lb (112.492 kg) IBW/kg (Calculated) : 77.6  Heparin Dosing Weight: 100kg  Vital Signs: Temp: 98.4 F (36.9 C) (01/17 0418) Temp src: Oral (01/17 0418) BP: 127/78 mmHg (01/17 1003) Pulse Rate: 62  (01/17 1003)  Labs:  Basename 12/17/12 0918 12/17/12 0913 12/17/12 0225 12/17/12 0016 12/17/12 0001  HGB -- 15.1 -- 15.3 --  HCT -- 43.5 -- 45.0 43.0  PLT -- 236 -- -- 235  APTT -- -- -- -- --  LABPROT -- -- -- -- --  INR -- -- -- -- --  HEPARINUNFRC 0.20* -- -- -- --  CREATININE -- 0.82 -- 0.90 --  CKTOTAL -- -- -- -- --  CKMB -- -- -- -- --  TROPONINI 0.52* -- <0.30 -- --    Estimated Creatinine Clearance: 128.8 ml/min (by C-G formula based on Cr of 0.82).  Assessment: 57yo male c/p substernal CP radiating to left arm and associated with SOB, episode initially spontaneously resolved but then began again several hours later, initial i-stat troponin negative. Heparin level 0.2 which is low after heparin drip started at 1200 units/hr with no bolus per ED MD.  Troponin +.    Goal of Therapy:  Heparin level 0.3-0.7 units/ml Monitor platelets by anticoagulation protocol: Yes   Plan:  1. Bolus 2000 units IV x 1 2. Increase drip to 1600 units/hr 3. Check HL 6 hrs after bolus 4. Daily HL and CBC while on heparin Herby Abraham, Pharm.D. [phone] 12/17/2012 11:27 AM

## 2012-12-18 DIAGNOSIS — I214 Non-ST elevation (NSTEMI) myocardial infarction: Secondary | ICD-10-CM

## 2012-12-18 LAB — BASIC METABOLIC PANEL
CO2: 25 mEq/L (ref 19–32)
Calcium: 8.9 mg/dL (ref 8.4–10.5)
GFR calc non Af Amer: >90 mL/min (ref >90)
Glucose, Bld: 104 mg/dL — ABNORMAL HIGH (ref 70–99)
Potassium: 3.9 mEq/L (ref 3.5–5.1)
Sodium: 138 mEq/L (ref 135–145)

## 2012-12-18 LAB — CBC
MCH: 28.8 pg (ref 26.0–34.0)
MCHC: 34.4 g/dL (ref 30.0–36.0)
Platelets: 199 10*3/uL (ref 150–400)
RBC: 4.82 MIL/uL (ref 4.22–5.81)

## 2012-12-18 MED ORDER — ASPIRIN 81 MG PO TABS: 81.0000 mg | ORAL_TABLET | Freq: Every day | ORAL | Status: AC

## 2012-12-18 MED ORDER — ATORVASTATIN CALCIUM 80 MG PO TABS: 80.0000 mg | ORAL_TABLET | Freq: Every day | ORAL | Status: DC

## 2012-12-18 MED ORDER — PRASUGREL HCL 10 MG PO TABS: 10.0000 mg | ORAL_TABLET | Freq: Every day | ORAL | Status: DC

## 2012-12-18 MED ORDER — NITROGLYCERIN 0.4 MG SL SUBL: 0.4000 mg | SUBLINGUAL_TABLET | SUBLINGUAL | Status: DC | PRN

## 2012-12-18 MED ORDER — NITROGLYCERIN 0.4 MG/HR TD PT24: 1.0000 | MEDICATED_PATCH | Freq: Every day | TRANSDERMAL | Status: DC

## 2012-12-18 NOTE — Progress Notes (Signed)
CARDIAC REHAB PHASE I   PRE:  Rate/Rhythm: 58 SB   BP:  Supine:   Sitting: 16109  Standing:    SaO2: 96 RA  MODE:  Ambulation: 600 ft   POST:  Rate/Rhythem: 80 SR  BP:  Supine:   Sitting: 133/81  Standing:    SaO2: 98 RA  Pt ambulated x1 assist with no problems. Gait steady. VSS. No complaints of CP or dyspnea. Pt stent education completed. Reviewed stent, importance of effient, asa, exercise guidelines, CP, when to call MD, heart healthy diet, and orientation to outpatient cardiac rehab. Pt voices understanding and would like a referral to [facility] for outpatient cardiac rehab. Will send referral. Pt back to bedside with breakfast and call bell in reach.  Minka Knight, Toni Amend

## 2012-12-18 NOTE — Progress Notes (Signed)
PROGRESS NOTE  Subjective:   Mr. Matthew Doyle is a 57 year old gentleman with a history of coronary artery disease who presented with a non-ST segment elevation myocardial infarction. He's had prior stenting of his left anterior descending artery. He had a cardiac catheterization which revealed a patent LAD stent. The posterior descending artery is occluded with left to right collaterals. He has a 90% stenosis in the proximal first obtuse marginal artery.  He had PTCA and stenting of the first obtuse  marginal artery using a 2.25 mm Promus Premier stent. The stent was dilated using a 2.5 mm noncompliant balloon.  Objective:    Vital Signs:   Temp:  [97.6 F (36.4 C)-98.6 F (37 C)] 97.7 F (36.5 C) (01/18 0550) Pulse Rate:  [53-62] 53  (01/18 0615) Resp:  [14-18] 14  (01/18 0615) BP: (90-127)/(55-78) 106/75 mmHg (01/18 0550) SpO2:  [95 %-99 %] 97 % (01/18 0615) Weight:  [259 lb 11.2 oz (117.8 kg)] 259 lb 11.2 oz (117.8 kg) (01/18 0550)  Last BM Date: 12/16/12   24-hour weight change: Weight change: 14 lb 11.2 oz (6.669 kg)  Weight trends: Filed Weights   12/17/12 0101 12/17/12 0418 12/18/12 0550  Weight: 245 lb (111.131 kg) 248 lb (112.492 kg) 259 lb 11.2 oz (117.8 kg)    Intake/Output:  01/17 0701 - 01/18 0700 In: 712.9 [I.V.:712.9] Out: 1825 [Urine:1825]     Physical Exam: BP 106/75  Pulse 53  Temp 97.7 F (36.5 C) (Oral)  Resp 14  Ht 6' (1.829 m)  Wt 259 lb 11.2 oz (117.8 kg)  BMI 35.22 kg/m2  SpO2 97%  General: Vital signs reviewed and noted.   Head: Normocephalic, atraumatic.  Eyes: conjunctivae/corneas clear.  EOM's intact.   Throat: normal  Neck:  normal  Lungs:    clear  Heart:  RR, normal S1 and S2   Abdomen:  Soft, non-tender, non-distended    Extremities:  no clubbing cyanosis or edema. His right radial cath site is well healed.   Neurologic: A&O X3, CN II - XII are grossly intact.   Psych: Normal     Labs: BMET:  Basename 12/18/12 0556  12/17/12 0913  NA 138 141  K 3.9 3.7  CL 103 104  CO2 25 26  GLUCOSE 104* 106*  BUN 12 13  CREATININE 0.91 0.82  CALCIUM 8.9 9.2  MG -- --  PHOS -- --    Liver function tests:  [facility] 12/17/12 0913  AST 16  ALT 17  ALKPHOS 68  BILITOT 0.6  PROT 7.3  ALBUMIN 3.8   No results found for this basename: LIPASE:2,AMYLASE:2 in the last 72 hours  CBC:  Basename 12/18/12 0556 12/17/12 0913 12/17/12 0001  WBC 9.7 8.5 --  NEUTROABS -- 5.3 6.8  HGB 13.9 15.1 --  HCT 40.4 43.5 --  MCV 83.8 82.5 --  PLT 199 236 --    Cardiac Enzymes:  Basename 12/17/12 1319 12/17/12 0918 12/17/12 0225  CKTOTAL -- -- --  CKMB -- -- --  TROPONINI 1.25* 0.52* <0.30    Coagulation Studies: No results found for this basename: LABPROT:5,INR:5 in the last 72 hours  Other: No components found with this basename: POCBNP:3 No results found for this basename: DDIMER in the last 72 hours No results found for this basename: HGBA1C in the last 72 hours  Basename 12/17/12 0913  CHOL 146  HDL 36*  LDLCALC 91  TRIG 96  CHOLHDL 4.1    Basename 12/17/12 0913  TSH 1.528  T4TOTAL --  T3FREE --  THYROIDAB --   No results found for this basename: VITAMINB12,FOLATE,FERRITIN,TIBC,IRON,RETICCTPCT in the last 72 hours   Telemetry: Normal sinus rhythm  Medications:    Infusions:    Scheduled Medications:    . amLODipine  10 mg Oral Daily  . aspirin  81 mg Oral Daily  . atorvastatin  80 mg Oral q1800  . losartan  50 mg Oral Daily  . nitroGLYCERIN  0.4 mg Transdermal Daily  . prasugrel  10 mg Oral Daily  . sodium chloride  3 mL Intravenous Q12H    Assessment/ Plan:   Patient Active Hospital Problem List: NSTEMI (non-ST elevated myocardial infarction) (12/17/2012)  he was found to have a tight stenosis of his first obtuse marginal artery. He underwent stenting with a drug eluding stent. The recommendation is that he have aspirin and Effient for one year.  He would like to try to eat  converted to generic Plavix at some point. We will let Dr. Mariah Milling address that   HYPERLIPIDEMIA (09/17/2007)  he should continue with the atorvastatin 80 mg a day. He'll followup with his medical Dr. for further management of his lipids.    HYPERTENSION (09/17/2007)  his blood pressure is well-controlled.   continue current medications.    Disposition: He'll be able to be discharged today. He will followup with Dr. Dossie Arbour in [place].  Length of Stay: 2  Vesta Mixer, Montez Hageman., MD, [facility] 12/18/2012, 8:20 AM Office [phone] Pager [phone]

## 2012-12-18 NOTE — Discharge Summary (Signed)
Physician Discharge Summary  Patient ID: Matthew Doyle MRN: [id number] DOB/AGE: June 04, 1956 57 y.o.  Admit date: 12/16/2012 Discharge date: 12/18/2012  Primary Care Physician:  Tillman Abide, MD   Discharge Diagnoses:    Principal Problem:  *NSTEMI (non-ST elevated myocardial infarction) Active Problems:  HYPERLIPIDEMIA  HYPERTENSION  CORONARY ARTERY DISEASE  GOUT  Chest pain      Medication List     As of 12/18/2012  9:36 AM    STOP taking these medications         pravastatin 40 MG tablet   Commonly known as: PRAVACHOL      TAKE these medications         amLODipine 10 MG tablet   Commonly known as: NORVASC   Take 1 tablet (10 mg total) by mouth daily.      aspirin 81 MG tablet   Take 1 tablet (81 mg total) by mouth daily.      atorvastatin 80 MG tablet   Commonly known as: LIPITOR   Take 1 tablet (80 mg total) by mouth daily at 6 PM.      losartan 50 MG tablet   Commonly known as: COZAAR   Take 1 tablet (50 mg total) by mouth daily.      nitroGLYCERIN 0.4 mg/hr   Commonly known as: NITRODUR - Dosed in mg/24 hr   Place 1 patch (0.4 mg total) onto the skin daily.      nitroGLYCERIN 0.4 MG SL tablet   Commonly known as: NITROSTAT   Place 1 tablet (0.4 mg total) under the tongue every 5 (five) minutes as needed for chest pain.      prasugrel 10 MG Tabs   Commonly known as: EFFIENT   Take 1 tablet (10 mg total) by mouth daily.         Disposition and Follow-up:  Will be discharged home today in stable and improved condition. Will follow up with his PCP in 2 weeks.  Consults:  Cardiology, Dr. Elease Hashimoto   Significant Diagnostic Studies:  Dg Chest 2 View  12/17/2012  *RADIOLOGY REPORT*  Clinical Data: Central and left-sided chest pain.  Shortness of breath.  CHEST - 2 VIEW  Comparison: None.  Findings: Shallow inspiration.  Borderline heart size with normal pulmonary vascularity.  No blunting of costophrenic angles.  No pneumothorax.  No focal  consolidation.  Tortuous aorta.  Mild degenerative changes in the spine.  IMPRESSION: Shallow inspiration.  No evidence of active pulmonary disease.   Original Report Authenticated By: Burman Nieves, M.D.     Brief H and P: For complete details please refer to admission H and P, but in brief patient is a 57 year old male with history of CAD status post stenting, hypertension and hyperlipidemia presented to the ER because of chest pain. Chest pain has been ongoing for last 2 weeks off and on. But last evening patient is to make a more persistent. Chest diminished his left arm with mild shortness of breath but denies any associated diaphoresis nausea vomiting or abdominal pain. In the ER EKG and chest x-ray were unremarkable. Cardiac enzymes have been negative. On-call cardiologist Dr. Shirlee Latch was consulted by the ER physician at this time was advised to start heparin and patient will be admitted for further management.     Hospital Course:  Principal Problem:  *NSTEMI (non-ST elevated myocardial infarction) Active Problems:  HYPERLIPIDEMIA  HYPERTENSION  CORONARY ARTERY DISEASE  GOUT  Chest pain   NSTEMI -s/p cardiac cath with findings of:  patent LAD stent. The posterior descending artery is occluded with left to right collaterals. He has a 90% stenosis in the proximal first obtuse marginal artery. He had PTCA and stenting of the first obtuse marginal artery using a 2.25 mm Promus Premier stent. The stent was dilated using a 2.5 mm noncompliant balloon. -Cardiology is recommending he stay on ASA and Effient for 1 year.  Hyperlipidemia -Continue lipitor 80.  HTN -Well controlled. -Continue current meds.    Time spent on Discharge: Greater than 30 minutes  Signed: Chaya Jan [facility] Pager: [phone] 12/18/2012, 9:36 AM

## 2012-12-20 ENCOUNTER — Telehealth: Payer: Self-pay | Admitting: *Deleted

## 2012-12-20 MED FILL — Dextrose Inj 5%: INTRAVENOUS | Qty: 50 | Status: AC

## 2012-12-20 NOTE — Telephone Encounter (Signed)
Yes, unless he has issues he would like to talk to me about separately

## 2012-12-20 NOTE — Telephone Encounter (Signed)
Matthew Doyle,  He is supposed to follow with me within 2 weeks. If he is seeing cardiology, okay to skip a visit with me (I don't see any scheduled)  Rich

## 2012-12-20 NOTE — Telephone Encounter (Signed)
Patient has appt with you on 12/31/12 @ 10:30 and has a 2:45 appt the same day with cardiology. Please advise if I should call pt to cancel your appt.

## 2012-12-20 NOTE — Progress Notes (Signed)
Discharge concurrent review completed. Leoda Smithhart J. Lucretia Roers, RN, BSN, [employer] [phone]

## 2012-12-21 NOTE — Telephone Encounter (Signed)
Spoke with patient and canceled appt with Dr.Letvak.

## 2012-12-31 ENCOUNTER — Ambulatory Visit (INDEPENDENT_AMBULATORY_CARE_PROVIDER_SITE_OTHER): Payer: BC Managed Care – PPO | Admitting: Cardiovascular Disease

## 2012-12-31 ENCOUNTER — Encounter: Payer: Self-pay | Admitting: Cardiovascular Disease

## 2012-12-31 ENCOUNTER — Ambulatory Visit: Payer: Self-pay | Admitting: Internal Medicine

## 2012-12-31 VITALS — BP 132/90 | HR 61 | Ht 72.0 in | Wt 250.8 lb

## 2012-12-31 DIAGNOSIS — I2510 Atherosclerotic heart disease of native coronary artery without angina pectoris: Secondary | ICD-10-CM

## 2012-12-31 DIAGNOSIS — I10 Essential (primary) hypertension: Secondary | ICD-10-CM

## 2012-12-31 DIAGNOSIS — E785 Hyperlipidemia, unspecified: Secondary | ICD-10-CM

## 2012-12-31 NOTE — Assessment & Plan Note (Signed)
Blood pressure is well controlled 

## 2012-12-31 NOTE — Assessment & Plan Note (Signed)
He is doing reasonably well after her recent non-ST elevation myocardial infarction. He underwent successful angioplasty and drug-eluting stent placement to the OM branch of left circumflex. I recommend continuing dual antiplatelet therapy for at least one year. I provided him with a co-pay cord for Effient. If he is not able to afford this medication, I will switch him to generic Plavix.  I advised him to attend cardiac rehabilitation but he wants to exercise on his own due to financial reasons.

## 2012-12-31 NOTE — Assessment & Plan Note (Signed)
He was switched to high dose atorvastatin during hospitalization. He will need a followup fasting lipid profile in the next few months.

## 2012-12-31 NOTE — Patient Instructions (Addendum)
Continue same medications.  Start exercising 3 -4 times/week for 30 minutes.  Follow up in 3 month (Come fasting to do labs).

## 2012-12-31 NOTE — Progress Notes (Signed)
Primary care physician: Dr. Alphonsus Sias  HPI  This is a 57 year old male who is here today for a followup visit after recent hospitalization for non-ST elevation myocardial infarction. He has known history of coronary artery disease status post angioplasty and drug-eluting stent placement to the LAD in 2004. He presented recently with a small non-ST elevation myocardial infarction. Cardiac catheterization showed patent LAD stent, occluded right PDA distally supplying a small territory and 99% proximal OM 2 stenosis which was the culprit. He underwent a successful angioplasty and drug-eluting stent placement. Ejection fraction was normal. He has been doing reasonably well since hospital discharge. He has not had any recurrent chest pain. He does have chronic exertional dyspnea. He has strong family history of premature coronary artery disease.  Allergies  Allergen Reactions  . Lisinopril     REACTION: cough with this and/or benazepril     Current Outpatient Prescriptions on File Prior to Visit  Medication Sig Dispense Refill  . amLODipine (NORVASC) 10 MG tablet Take 1 tablet (10 mg total) by mouth daily.  30 tablet  11  . aspirin 81 MG tablet Take 1 tablet (81 mg total) by mouth daily.  30 tablet    . atorvastatin (LIPITOR) 80 MG tablet Take 1 tablet (80 mg total) by mouth daily at 6 PM.  30 tablet  11  . losartan (COZAAR) 50 MG tablet Take 1 tablet (50 mg total) by mouth daily.  30 tablet  11  . nitroGLYCERIN (NITROSTAT) 0.4 MG SL tablet Place 1 tablet (0.4 mg total) under the tongue every 5 (five) minutes as needed for chest pain.  25 tablet  3  . prasugrel (EFFIENT) 10 MG TABS Take 1 tablet (10 mg total) by mouth daily.  30 tablet  0     Past Medical History  Diagnosis Date  . Hypertension   . Hyperlipidemia   . Atypical chest pain   . Hemorrhoids   . Colon polyps   . Diverticulosis   . Gout   . Internal hemorrhoids without mention of complication   . MI (myocardial infarction)   .  CAD (coronary artery disease)     a. 09/2003 Cath/PCI: LAD 80%->3.0x23 Cypher DES;  b. 03/2004 Cath: LM nl, LAD 20p, 85m, patent stent, D1 25, D2 small, 80/jailed, LCX 31m, OM1 nl, OM2 20, RCA 40-50p, PDA 45m;  c. Non-ST elevation MI in January of 2014. Cardiac cath showed patent LAD stent, included distal right PDA supplying a small territory, 99% proximal OM 2 status post PCI and DES placement      Past Surgical History  Procedure Date  . Cypher stent 10/04    mid LAD- gupta  . Cardiac catheterization 2014    s/p stent     Family History  Problem Relation Age of Onset  . Diabetes Father   . Coronary artery disease Father     CABG - 5 vessel 15 yr ago  . Hypertension Brother   . Hypertension Brother   . Arrhythmia Brother   . Heart attack Paternal Uncle     Age 7  . Heart attack Maternal Uncle     Age 89  . Coronary artery disease Maternal Grandfather   . Heart attack Maternal Grandfather     Died in 66s  . Throat cancer Maternal Grandfather   . Hypertension Mother      History   Social History  . Marital Status: Married    Spouse Name: N/A    Number of Children:  2  . Years of Education: N/A   Occupational History  . 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 Smoking status: Never Smoker   . Smokeless tobacco: Never Used  . Alcohol Use: No  . Drug Use: No  . Sexually Active: Not on file   Other Topics Concern  . 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 He appears well-developed and well-nourished. No distress.  HENT: No nasal discharge.  Head: Normocephalic and atraumatic.  Eyes: Pupils are equal and round. Right eye exhibits no discharge. Left eye exhibits no  discharge.  Neck: Normal range of motion. Neck supple. No JVD present. No thyromegaly present.  Cardiovascular: Normal rate, regular rhythm, normal heart sounds and. Exam reveals no gallop and no friction rub. No murmur heard.  Pulmonary/Chest: Effort normal and breath sounds normal. No stridor. No respiratory distress. He has no wheezes. He has no rales. He exhibits no tenderness.  Abdominal: Soft. Bowel sounds are normal. He exhibits no distension. There is no tenderness. There is no rebound and no guarding.  Musculoskeletal: Normal range of motion. He exhibits no edema and no tenderness.  Neurological: He is alert and oriented to person, place, and time. Coordination normal.  Skin: Skin is warm and dry. No rash noted. He is not diaphoretic. No erythema. No pallor.  Psychiatric: He has a normal mood and affect. His behavior is normal. Judgment and thought content normal.  Right radial pulse is normal with no hematoma     EKG: Sinus  Rhythm  -  Nonspecific inferior T-abnormality.   ABNORMAL    ASSESSMENT AND PLAN

## 2013-01-15 ENCOUNTER — Other Ambulatory Visit (HOSPITAL_COMMUNITY): Payer: Self-pay | Admitting: Physician Assistant

## 2013-02-15 ENCOUNTER — Other Ambulatory Visit (HOSPITAL_COMMUNITY): Payer: Self-pay | Admitting: Cardiovascular Disease

## 2013-03-19 ENCOUNTER — Other Ambulatory Visit (HOSPITAL_COMMUNITY): Payer: Self-pay | Admitting: Cardiovascular Disease

## 2013-03-21 ENCOUNTER — Other Ambulatory Visit: Payer: Self-pay | Admitting: *Deleted

## 2013-03-21 MED ORDER — PRASUGREL HCL 10 MG PO TABS: ORAL_TABLET | ORAL | Status: DC

## 2013-03-21 NOTE — Telephone Encounter (Signed)
Needs refill sent to [facility] on [address]

## 2013-03-31 ENCOUNTER — Encounter: Payer: Self-pay | Admitting: Cardiovascular Disease

## 2013-03-31 ENCOUNTER — Ambulatory Visit (INDEPENDENT_AMBULATORY_CARE_PROVIDER_SITE_OTHER): Payer: BC Managed Care – PPO | Admitting: Cardiovascular Disease

## 2013-03-31 VITALS — BP 124/72 | HR 57 | Ht 72.0 in | Wt 237.8 lb

## 2013-03-31 DIAGNOSIS — E785 Hyperlipidemia, unspecified: Secondary | ICD-10-CM

## 2013-03-31 DIAGNOSIS — I2510 Atherosclerotic heart disease of native coronary artery without angina pectoris: Secondary | ICD-10-CM

## 2013-03-31 DIAGNOSIS — I10 Essential (primary) hypertension: Secondary | ICD-10-CM

## 2013-03-31 MED ORDER — PRASUGREL HCL 10 MG PO TABS: ORAL_TABLET | ORAL | Status: DC

## 2013-03-31 NOTE — Patient Instructions (Addendum)
Labs today.  Continue same medications.  Follow up in 6 months.

## 2013-04-01 LAB — HEPATIC FUNCTION PANEL
ALT: 24 IU/L (ref 0–44)
AST: 20 IU/L (ref 0–40)
Bilirubin, Direct: 0.23 mg/dL (ref 0.00–0.40)
Total Bilirubin: 0.8 mg/dL (ref 0.0–1.2)

## 2013-04-01 LAB — LIPID PANEL
Cholesterol, Total: 91 mg/dL — ABNORMAL LOW (ref 100–199)
HDL: 36 mg/dL — ABNORMAL LOW (ref >39)
Triglycerides: 42 mg/dL (ref 0–149)

## 2013-04-01 NOTE — Assessment & Plan Note (Signed)
He is doing very well at this time with no symptoms suggestive of angina. Continue medical therapy on dual antiplatelet therapy.

## 2013-04-01 NOTE — Assessment & Plan Note (Signed)
Continue high dose atorvastatin. I will check fasting lipid and liver profile.

## 2013-04-01 NOTE — Assessment & Plan Note (Signed)
His blood pressure is well controlled on current medications. He is slightly bradycardic but overall is asymptomatic.

## 2013-04-01 NOTE — Progress Notes (Signed)
Informed pt of lab results  

## 2013-04-01 NOTE — Progress Notes (Signed)
Primary care physician: Dr. Alphonsus Sias  HPI  This is a 57 year old male who is here today for a followup visit regarding coronary artery disease.  He has known history of coronary artery disease status post angioplasty and drug-eluting stent placement to the LAD in 2004. He presented to [facility] in January of 2014 with a small non-ST elevation myocardial infarction. Cardiac catheterization showed patent LAD stent, occluded right PDA distally supplying a small territory and 99% proximal OM 2 stenosis which was the culprit. He underwent a successful angioplasty and drug-eluting stent placement. Ejection fraction was normal. He has been doing reasonably well since hospital discharge. He has not had any recurrent chest pain. He does have chronic exertional dyspnea. He has strong family history of premature coronary artery disease. He reports no side effects with medication.  Allergies  Allergen Reactions  . Lisinopril     REACTION: cough with this and/or benazepril     Current Outpatient Prescriptions on File Prior to Visit  Medication Sig Dispense Refill  . amLODipine (NORVASC) 10 MG tablet Take 1 tablet (10 mg total) by mouth daily.  30 tablet  11  . aspirin 81 MG tablet Take 1 tablet (81 mg total) by mouth daily.  30 tablet    . atorvastatin (LIPITOR) 80 MG tablet Take 1 tablet (80 mg total) by mouth daily at 6 PM.  30 tablet  11  . losartan (COZAAR) 50 MG tablet Take 1 tablet (50 mg total) by mouth daily.  30 tablet  11  . nitroGLYCERIN (NITROSTAT) 0.4 MG SL tablet Place 1 tablet (0.4 mg total) under the tongue every 5 (five) minutes as needed for chest pain.  25 tablet  3   No current facility-administered medications on file prior to visit.     Past Medical History  Diagnosis Date  . Hypertension   . Hyperlipidemia   . Atypical chest pain   . Hemorrhoids   . Colon polyps   . Diverticulosis   . Gout   . Internal hemorrhoids without mention of complication   . MI  (myocardial infarction)   . CAD (coronary artery disease)     a. 09/2003 Cath/PCI: LAD 80%->3.0x23 Cypher DES;  b. 03/2004 Cath: LM nl, LAD 20p, 29m, patent stent, D1 25, D2 small, 80/jailed, LCX 12m, OM1 nl, OM2 20, RCA 40-50p, PDA 45m;  c. Non-ST elevation MI in January of 2014. Cardiac cath showed patent LAD stent, included distal right PDA supplying a small territory, 99% proximal OM 2 status post PCI and DES placement      Past Surgical History  Procedure Laterality Date  . Cypher stent  10/04    mid LAD- gupta  . Cardiac catheterization  2014    s/p stent     Family History  Problem Relation Age of Onset  . Diabetes Father   . Coronary artery disease Father     CABG - 5 vessel 15 yr ago  . Hypertension Brother   . Hypertension Brother   . Arrhythmia Brother   . Heart attack Paternal Uncle     Age 45  . Heart attack Maternal Uncle     Age 57  . Coronary artery disease Maternal Grandfather   . Heart attack Maternal Grandfather     Died in 10s  . Throat cancer Maternal Grandfather   . Hypertension Mother      History   Social History  . Marital Status: Married    Spouse Name: N/A  Number of Children: 2  . Years of Education: N/A   Occupational History  . 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 Smoking status: Never Smoker   . Smokeless tobacco: Never Used  . Alcohol Use: No  . Drug Use: No  . Sexually Active: Not on file   Other Topics Concern  . 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 He appears well-developed and well-nourished. No distress.  HENT: No nasal discharge.  Head: Normocephalic and atraumatic.  Eyes: Pupils are equal  and round. Right eye exhibits no discharge. Left eye exhibits no discharge.  Neck: Normal range of motion. Neck supple. No JVD present. No thyromegaly present.  Cardiovascular: Normal rate, regular rhythm, normal heart sounds and. Exam reveals no gallop and no friction rub. No murmur heard.  Pulmonary/Chest: Effort normal and breath sounds normal. No stridor. No respiratory distress. He has no wheezes. He has no rales. He exhibits no tenderness.  Abdominal: Soft. Bowel sounds are normal. He exhibits no distension. There is no tenderness. There is no rebound and no guarding.  Musculoskeletal: Normal range of motion. He exhibits no edema and no tenderness.  Neurological: He is alert and oriented to person, place, and time. Coordination normal.  Skin: Skin is warm and dry. No rash noted. He is not diaphoretic. No erythema. No pallor.  Psychiatric: He has a normal mood and affect. His behavior is normal. Judgment and thought content normal.  Right radial pulse is normal with no hematoma     EKG: Sinus  Bradycardia  WITHIN NORMAL LIMITS   ASSESSMENT AND PLAN

## 2013-05-17 ENCOUNTER — Encounter: Payer: BC Managed Care – PPO | Admitting: Internal Medicine

## 2013-05-24 ENCOUNTER — Encounter: Payer: Self-pay | Admitting: Internal Medicine

## 2013-05-24 ENCOUNTER — Ambulatory Visit (INDEPENDENT_AMBULATORY_CARE_PROVIDER_SITE_OTHER): Payer: BC Managed Care – PPO | Admitting: Internal Medicine

## 2013-05-24 VITALS — BP 118/80 | HR 62 | Temp 98.4°F | Ht 71.0 in | Wt 237.0 lb

## 2013-05-24 DIAGNOSIS — Z0000 Encounter for general adult medical examination without abnormal findings: Secondary | ICD-10-CM

## 2013-05-24 DIAGNOSIS — M109 Gout, unspecified: Secondary | ICD-10-CM

## 2013-05-24 DIAGNOSIS — E785 Hyperlipidemia, unspecified: Secondary | ICD-10-CM

## 2013-05-24 DIAGNOSIS — I2510 Atherosclerotic heart disease of native coronary artery without angina pectoris: Secondary | ICD-10-CM

## 2013-05-24 DIAGNOSIS — I10 Essential (primary) hypertension: Secondary | ICD-10-CM

## 2013-05-24 LAB — CBC WITH DIFFERENTIAL/PLATELET
Basophils Relative: 0.5 % (ref 0.0–3.0)
Eosinophils Absolute: 0.2 10*3/uL (ref 0.0–0.7)
Eosinophils Relative: 2.7 % (ref 0.0–5.0)
Hemoglobin: 15.2 g/dL (ref 13.0–17.0)
Lymphocytes Relative: 25.1 % (ref 12.0–46.0)
Monocytes Relative: 9.5 % (ref 3.0–12.0)
Neutro Abs: 5.2 10*3/uL (ref 1.4–7.7)
Neutrophils Relative %: 62.2 % (ref 43.0–77.0)
RBC: 5.22 Mil/uL (ref 4.22–5.81)
WBC: 8.4 10*3/uL (ref 4.5–10.5)

## 2013-05-24 LAB — URIC ACID: Uric Acid, Serum: 6.7 mg/dL (ref 4.0–7.8)

## 2013-05-24 LAB — BASIC METABOLIC PANEL
Calcium: 9.5 mg/dL (ref 8.4–10.5)
Creatinine, Ser: 0.9 mg/dL (ref 0.4–1.5)
GFR: 89.91 mL/min (ref >60.00)
Sodium: 140 mEq/L (ref 135–145)

## 2013-05-24 NOTE — Assessment & Plan Note (Signed)
Discussed exercise on every day off, working on diet and weight loss Defer PSA to next year UTD otherwise

## 2013-05-24 NOTE — Assessment & Plan Note (Signed)
Lab Results  Component Value Date   LDLCALC 47 03/31/2013   High dose statin

## 2013-05-24 NOTE — Assessment & Plan Note (Signed)
Recent angioplasty Doing well now

## 2013-05-24 NOTE — Progress Notes (Signed)
Subjective:    Patient ID: Matthew Doyle, male    DOB: 03-Sep-1956, 57 y.o.   MRN: [id number]  HPI Here for physical Had another angioplasty in January and is doing well Back at work and no restrictions Feels tired at the end of the day but not dyspneic  Not really doing exercise Occasionally rides bike  He has no new concerns  Current Outpatient Prescriptions on File Prior to Visit  Medication Sig Dispense Refill  . amLODipine (NORVASC) 10 MG tablet Take 1 tablet (10 mg total) by mouth daily.  30 tablet  11  . aspirin 81 MG tablet Take 1 tablet (81 mg total) by mouth daily.  30 tablet    . atorvastatin (LIPITOR) 80 MG tablet Take 1 tablet (80 mg total) by mouth daily at 6 PM.  30 tablet  11  . losartan (COZAAR) 50 MG tablet Take 1 tablet (50 mg total) by mouth daily.  30 tablet  11  . nitroGLYCERIN (NITROSTAT) 0.4 MG SL tablet Place 1 tablet (0.4 mg total) under the tongue every 5 (five) minutes as needed for chest pain.  25 tablet  3  . prasugrel (EFFIENT) 10 MG TABS TAKE ONE TABLET BY MOUTH ONCE DAILY  30 tablet  9   No current facility-administered medications on file prior to visit.    Allergies  Allergen Reactions  . Lisinopril     REACTION: cough with this and/or benazepril    Past Medical History  Diagnosis Date  . Hypertension   . Hyperlipidemia   . Atypical chest pain   . Hemorrhoids   . Colon polyps   . Diverticulosis   . Gout   . Internal hemorrhoids without mention of complication   . MI (myocardial infarction)   . CAD (coronary artery disease)     a. 09/2003 Cath/PCI: LAD 80%->3.0x23 Cypher DES;  b. 03/2004 Cath: LM nl, LAD 20p, 44m, patent stent, D1 25, D2 small, 80/jailed, LCX 21m, OM1 nl, OM2 20, RCA 40-50p, PDA 75m;  c. Non-ST elevation MI in January of 2014. Cardiac cath showed patent LAD stent, included distal right PDA supplying a small territory, 99% proximal OM 2 status post PCI and DES placement     Past Surgical History  Procedure Laterality  Date  . Cypher stent  10/04    mid LAD- gupta  . Cardiac catheterization  2014    s/p stent    Family History  Problem Relation Age of Onset  . Diabetes Father   . Coronary artery disease Father     CABG - 5 vessel 15 yr ago  . Hypertension Brother   . Hypertension Brother   . Arrhythmia Brother   . Heart attack Paternal Uncle     Age 31  . Heart attack Maternal Uncle     Age 24  . Coronary artery disease Maternal Grandfather   . Heart attack Maternal Grandfather     Died in 56s  . Throat cancer Maternal Grandfather   . Hypertension Mother     History   Social History  . Marital Status: Married    Spouse Name: N/A    Number of Children: 2  . Years of Education: N/A   Occupational History  .     [employer] MECHANIC [employer]   Social History Main Topics  . Smoking status: Never Smoker   . Smokeless tobacco: Never Used  . Alcohol Use: No  . Drug Use: No  . Sexually Active: Not on  file   Other Topics Concern  . Not on file   Social History Narrative   Engineer, water for [employer]         Review of Systems  Constitutional: Negative for fatigue and unexpected weight change.       Wears seat belt  HENT: Positive for hearing loss, congestion, rhinorrhea, dental problem and tinnitus.        Overdue for dentist---plans to eventually get them pulled   Eyes: Negative for visual disturbance.       No diplopia or unilateral vision changes  Respiratory: Negative for cough, chest tightness and shortness of breath.   Cardiovascular: Negative for chest pain, palpitations and leg swelling.  Gastrointestinal: Negative for nausea, vomiting, abdominal pain, constipation and blood in stool.       No heartburn  Endocrine: Positive for cold intolerance. Negative for heat intolerance.  Genitourinary: Negative for urgency, frequency and difficulty urinating.       No sexual problems  Musculoskeletal: Positive for arthralgias. Negative for back pain and joint  swelling.       Knee and hand pain---doesn't really use meds NSAIDs hurt stomach. (but tolerates ibuprofen)  Skin: Negative for rash.       No suspicious lesions  Allergic/Immunologic: Positive for environmental allergies. Negative for immunocompromised state.       Mild spring allergies-- doesn't use meds  Neurological: Negative for syncope, weakness, light-headedness, numbness and headaches.  Hematological: Negative for adenopathy. Bruises/bleeds easily.  Psychiatric/Behavioral: Negative for sleep disturbance and dysphoric mood. The patient is not nervous/anxious.        Objective:   Physical Exam  Constitutional: He is oriented to person, place, and time. He appears well-developed and well-nourished. No distress.  HENT:  Head: Normocephalic and atraumatic.  Right Ear: External ear normal.  Left Ear: External ear normal.  Mouth/Throat: Oropharynx is clear and moist. No oropharyngeal exudate.  Eyes: Conjunctivae and EOM are normal. Pupils are equal, round, and reactive to light.  Neck: Normal range of motion. Neck supple. No thyromegaly present.  Cardiovascular: Normal rate, regular rhythm, normal heart sounds and intact distal pulses.  Exam reveals no gallop.   No murmur heard. Pulmonary/Chest: Effort normal and breath sounds normal. No respiratory distress. He has no wheezes. He has no rales.  Abdominal: Soft. There is no tenderness.  Musculoskeletal: He exhibits no edema and no tenderness.  Lymphadenopathy:    He has no cervical adenopathy.  Neurological: He is alert and oriented to person, place, and time.  Skin: No erythema.  Mild scaling on plantar feet  Psychiatric: He has a normal mood and affect. His behavior is normal.          Assessment & Plan:

## 2013-05-24 NOTE — Assessment & Plan Note (Signed)
BP Readings from Last 3 Encounters:  05/24/13 118/80  03/31/13 124/72  12/31/12 132/90   Good control No changes needed

## 2013-05-24 NOTE — Patient Instructions (Addendum)
DASH Diet  The DASH diet stands for "Dietary Approaches to Stop Hypertension." It is a healthy eating plan that has been shown to reduce high blood pressure (hypertension) in as little as 14 days, while also possibly providing other significant health benefits. These other health benefits include reducing the risk of breast cancer after menopause and reducing the risk of type 2 diabetes, heart disease, colon cancer, and stroke. Health benefits also include weight loss and slowing kidney failure in patients with chronic kidney disease.   DIET GUIDELINES  · Limit salt (sodium). Your diet should contain less than 1500 mg of sodium daily.  · Limit refined or processed carbohydrates. Your diet should include mostly whole grains. Desserts and added sugars should be used sparingly.  · Include small amounts of heart-healthy fats. These types of fats include nuts, oils, and tub margarine. Limit saturated and trans fats. These fats have been shown to be harmful in the body.  CHOOSING FOODS   The following food groups are based on a 2000 calorie diet. See your Registered Dietitian for individual calorie needs.  Grains and Grain Products (6 to 8 servings daily)  · Eat More Often: Whole-wheat bread, brown rice, whole-grain or wheat pasta, quinoa, popcorn without added fat or salt (air popped).  · Eat Less Often: White bread, white pasta, white rice, cornbread.  Vegetables (4 to 5 servings daily)  · Eat More Often: Fresh, frozen, and canned vegetables. Vegetables may be raw, steamed, roasted, or grilled with a minimal amount of fat.  · Eat Less Often/Avoid: Creamed or fried vegetables. Vegetables in a cheese sauce.  Fruit (4 to 5 servings daily)  · Eat More Often: All fresh, canned (in natural juice), or frozen fruits. Dried fruits without added sugar. One hundred percent fruit juice (½ cup [237 mL] daily).  · Eat Less Often: Dried fruits with added sugar. Canned fruit in light or heavy syrup.  Lean Meats, Fish, and Poultry (2  servings or less daily. One serving is 3 to 4 oz [85-114 g]).  · Eat More Often: Ninety percent or leaner ground beef, tenderloin, sirloin. Round cuts of beef, chicken breast, turkey breast. All fish. Grill, bake, or broil your meat. Nothing should be fried.  · Eat Less Often/Avoid: Fatty cuts of meat, turkey, or chicken leg, thigh, or wing. Fried cuts of meat or fish.  Dairy (2 to 3 servings)  · Eat More Often: Low-fat or fat-free milk, low-fat plain or light yogurt, reduced-fat or part-skim cheese.  · Eat Less Often/Avoid: Milk (whole, 2%). Whole milk yogurt. Full-fat cheeses.  Nuts, Seeds, and Legumes (4 to 5 servings per week)  · Eat More Often: All without added salt.  · Eat Less Often/Avoid: Salted nuts and seeds, canned beans with added salt.  Fats and Sweets (limited)  · Eat More Often: Vegetable oils, tub margarines without trans fats, sugar-free gelatin. Mayonnaise and salad dressings.  · Eat Less Often/Avoid: Coconut oils, palm oils, butter, stick margarine, cream, half and half, cookies, candy, pie.  FOR MORE INFORMATION  The Dash Diet Eating Plan: www.dashdiet.org  Document Released: 11/06/2011 Document Revised: 02/09/2012 Document Reviewed: 11/06/2011  ExitCare® Patient Information ©2014 [employer].

## 2013-05-24 NOTE — Assessment & Plan Note (Signed)
Has ongoing pain at times Better lately Left great toe only

## 2013-06-01 ENCOUNTER — Encounter: Payer: Self-pay | Admitting: Internal Medicine

## 2013-06-01 ENCOUNTER — Telehealth: Payer: Self-pay | Admitting: Internal Medicine

## 2013-06-01 NOTE — Telephone Encounter (Signed)
Patient Information:  Caller Name: Rosey Bath  Phone: [phone]  Patient: Matthew Doyle, Matthew Doyle  Gender: Male  DOB: 1956/04/07  Age: 57 Years  PCP: [facility])  Office Follow Up:  Does the office need to follow up with this patient?: Yes  Instructions For The Office: Please call and advise.   Symptoms  Reason For Call & Symptoms: Pt began c/o abdominal pain underneath his breastbone last night. Pts wife took him to the store in the middle of the night and bought him some otc  Zantac 75mg . Pt felt "full" and he was burping and passing gas at the time. BP was 164/106 at that time with a heartrate of 61 (she took the BP at home/pt was very uncomfortable at the time). Pt felt overly "full" after eating supper.  He does have a hx of a M.I. in Jan of this year. Pt told his wife the Zantac helped. Then he took Mirilax at that time also. This am the pt had a bm and his sx have abated. His wife is calling to see if the pt can take Zantac q day. The pt has been increasingly constipated over the past several months. The wife thought he discussed it with MD at his visit/but RN did not see the note in [facility] Epic. He does have a daily bm but strains to pass it and the stool is hard. Pts spouse is asking if he can start a daily dose of Zantac and Miriliax. Today he has no pain except the aching from the pain he felt last night still in the upper epigastric area.  Reviewed Health History In EMR: Yes  Reviewed Medications In EMR: Yes  Reviewed Allergies In EMR: Yes  Reviewed Surgeries / Procedures: Yes  Date of Onset of Symptoms: 05/31/2013  Treatments Tried: Zantac  Treatments Tried Worked: No  Guideline(s) Used:  Abdominal Pain - Male  Disposition Per Guideline:   Home Care  Reason For Disposition Reached:   Mild abdominal pain  Advice Given:  N/A  RN Overrode Recommendation:  Follow Up With Office Later  Request sent to MD d/t complicated health hx for MD to ok.

## 2013-06-02 NOTE — Telephone Encounter (Signed)
Spoke with patient's wife and advised results  

## 2013-06-02 NOTE — Telephone Encounter (Signed)
The pain may have been from constipation I recommend the miralax daily and only use the zantac (ranitidine) regularly if he has ongoing stomach/heartburn problems once bowels are going regularly

## 2013-06-18 ENCOUNTER — Other Ambulatory Visit: Payer: Self-pay | Admitting: Internal Medicine

## 2013-08-05 ENCOUNTER — Encounter: Payer: Self-pay | Admitting: Gastroenterology

## 2013-08-11 ENCOUNTER — Encounter: Payer: Self-pay | Admitting: Gastroenterology

## 2013-08-23 ENCOUNTER — Encounter: Payer: Self-pay | Admitting: Nurse Practitioner

## 2013-08-30 ENCOUNTER — Encounter: Payer: Self-pay | Admitting: Gastroenterology

## 2013-08-30 ENCOUNTER — Telehealth: Payer: Self-pay | Admitting: Gastroenterology

## 2013-08-30 ENCOUNTER — Ambulatory Visit (INDEPENDENT_AMBULATORY_CARE_PROVIDER_SITE_OTHER): Payer: BC Managed Care – PPO | Admitting: Nurse Practitioner

## 2013-08-30 ENCOUNTER — Encounter: Payer: Self-pay | Admitting: Nurse Practitioner

## 2013-08-30 VITALS — BP 130/80 | HR 59 | Ht 70.5 in | Wt 239.0 lb

## 2013-08-30 DIAGNOSIS — I2510 Atherosclerotic heart disease of native coronary artery without angina pectoris: Secondary | ICD-10-CM

## 2013-08-30 DIAGNOSIS — Z860100 Personal history of colon polyps, unspecified: Secondary | ICD-10-CM

## 2013-08-30 DIAGNOSIS — Z1211 Encounter for screening for malignant neoplasm of colon: Secondary | ICD-10-CM

## 2013-08-30 MED ORDER — SOD PICOSULFATE-MAG OX-CIT ACD 10-3.5-12 MG-GM-GM PO PACK: 1.0000 | PACK | Freq: Once | ORAL | Status: DC

## 2013-08-30 NOTE — Telephone Encounter (Signed)
  08/30/2013    RE: Matthew Doyle DOB: August 19, 1956 MRN: [id number]   Dear Dr.  Kirke Corin,    We have scheduled the above patient for an endoscopic procedure. Our records show that he is on anticoagulation therapy.   Please advise as to how long the patient may come off his therapy of Effient prior to the procedure, which is scheduled for 11/15/2013.  Please fax back/ or route the completed form to Amanda/Emmogene Simson at [phone].   Sincerely,  Reese Stockman CMA-AAMA

## 2013-08-30 NOTE — Patient Instructions (Addendum)
You have been scheduled for a colonoscopy with propofol. Please follow written instructions given to you at your visit today.  Please pick up your prep kit at the pharmacy within the next 1-3 days. If you use inhalers (even only as needed), please bring them with you on the day of your procedure. Your physician has requested that you go to www.startemmi.com and enter the access code given to you at your visit today. This web site gives a general overview about your procedure. However, you should still follow specific instructions given to you by our office regarding your preparation for the procedure.  We have sent the following medications to your pharmacy for you to pick up at your convenience: Prepopik    We will contact you about your blood thinner                                               We are excited to introduce MyChart, a new best-in-class service that provides you online access to important information in your electronic medical record. We want to make it easier for you to view your health information - all in one secure location - when and where you need it. We expect MyChart will enhance the quality of care and service we provide.  When you register for MyChart, you can:    View your test results.    Request appointments and receive appointment reminders via email.    Request medication renewals.    View your medical history, allergies, medications and immunizations.    Communicate with your physician's office through a password-protected site.    Conveniently print information such as your medication lists.  To find out if MyChart is right for you, please talk to a member of our clinical staff today. We will gladly answer your questions about this free health and wellness tool.  If you are age 58 or older and want a member of your family to have access to your record, you must provide written consent by completing a proxy form available at our office. Please speak to  our clinical staff about guidelines regarding accounts for patients younger than age 34.  As you activate your MyChart account and need any technical assistance, please call the MyChart technical support line at [phone]) or email your question to [email or web address]. If you email your question(s), please include your name, a return phone number and the best time to reach you.  If you have non-urgent health-related questions, you can send a message to our office through MyChart at [email or web address]. If you have a medical emergency, call 911.  Thank you for using MyChart as your new health and wellness resource!   MyChart licensed from [employer],  [id number]. Patents Pending.

## 2013-08-30 NOTE — Progress Notes (Signed)
HPI :  Patient is a 57 year-old male known remotely to Dr. Russella Dar. Patient had a colonoscopy in 2009 for evaluation of hematochezia and constipation. Findings included diverticulosis, hemorrhoids and a small sigmoid polyp (tubular adenoma). Patient has received his recall colonoscopy letter and is here to discuss this further. Patient has no GI complaints. No blood in stool.   Patient is on Effient. He has CAD and is s/p stenting angioplasty DES placement in 2004. He had another DES after a NSTEMI January 2014. Patient is on Effient, followed by Dr. Kirke Corin.  No recent chest pain.    Past Medical History  Diagnosis Date  . Hypertension   . Hyperlipidemia   . Hemorrhoids   . Colon polyps, adenomatous   . Diverticulosis   . Gout   . Internal hemorrhoids without mention of complication   . MI (myocardial infarction)   . CAD (coronary artery disease)     a. 09/2003 Cath/PCI: LAD 80%->3.0x23 Cypher DES;  b. 03/2004 Cath: LM nl, LAD 20p, 25m, patent stent, D1 25, D2 small, 80/jailed, LCX 37m, OM1 nl, OM2 20, RCA 40-50p, PDA 70m;  c. Non-ST elevation MI in January of 2014. Cardiac cath showed patent LAD stent, included distal right PDA supplying a small territory, 99% proximal OM 2 status post PCI and DES placement      Family History  Problem Relation Age of Onset  . Diabetes Father   . Coronary artery disease Father     CABG - 5 vessel 15 yr ago  . Hypertension Brother   . Hypertension Brother   . Arrhythmia Brother   . Heart attack Paternal Uncle     Age 48  . Heart attack Maternal Uncle     Age 66  . Coronary artery disease Maternal Grandfather   . Heart attack Maternal Grandfather     Died in 11s  . Throat cancer Maternal Grandfather   . Hypertension Mother    History  Substance Use Topics  . Smoking status: Never Smoker   . Smokeless tobacco: Never Used  . Alcohol Use: No   Current Outpatient Prescriptions  Medication Sig Dispense Refill  . amLODipine (NORVASC) 10 MG tablet  TAKE ONE TABLET BY MOUTH EVERY DAY  90 tablet  3  . aspirin 81 MG tablet Take 1 tablet (81 mg total) by mouth daily.  30 tablet    . atorvastatin (LIPITOR) 80 MG tablet Take 1 tablet (80 mg total) by mouth daily at 6 PM.  30 tablet  11  . colchicine 0.6 MG tablet Take 0.6 mg by mouth 2 (two) times daily as needed. For gout flares      . losartan (COZAAR) 50 MG tablet TAKE ONE TABLET BY MOUTH EVERY DAY  90 tablet  3  . nitroGLYCERIN (NITROSTAT) 0.4 MG SL tablet Place 1 tablet (0.4 mg total) under the tongue every 5 (five) minutes as needed for chest pain.  25 tablet  3  . prasugrel (EFFIENT) 10 MG TABS TAKE ONE TABLET BY MOUTH ONCE DAILY  30 tablet  9  . Sod Picosulfate-Mag Ox-Cit Acd 10-3.5-12 MG-GM-GM PACK Take 1 kit by mouth once.  1 each  0   No current facility-administered medications for this visit.   Allergies  Allergen Reactions  . Lisinopril     REACTION: cough with this and/or benazepril   Review of Systems: All systems reviewed and negative except where noted in HPI.   Physical Exam: BP 130/80  Pulse 59  Ht 5'  10.5" (1.791 m)  Wt 239 lb (108.41 kg)  BMI 33.8 kg/m2 Constitutional: Pleasant,well-developed, white male in no acute distress. HEENT: Normocephalic and atraumatic. Conjunctivae are normal. No scleral icterus. Neck supple.  Cardiovascular: Normal rate, regular rhythm.  Pulmonary/chest: Effort normal and breath sounds normal. No wheezing, rales or rhonchi. Abdominal: Soft, nondistended, nontender. Bowel sounds active throughout. There are no masses palpable. No hepatomegaly. Extremities: no edema Neurological: Alert and oriented to person place and time. Psychiatric: Normal mood and affect. Behavior is normal.   ASSESSMENT AND PLAN:  61. 57 year-old male with a history of adenomatous colon polyps, now due for surveillance colonoscopy. Patient is on antiplatelet therapy, see #2. Patient will be scheduled for surveillance colonoscopy (pending what cardiologist has  to say about antiplatelet therapy). The risks, benefits, and alternatives to colonoscopy with possible biopsy and possible polypectomy were discussed with the patient and he consents to proceed.   2. Coronary artery disease, status post drug-eluting stent in 2004 and again in January 2014. Patient is on Effient, followed by Dr. Kirke Corin. We will contact cardiologist to inquire about holding antiplatelet medication for colonoscopy. Since it has been less than 1 year since stent placement cardiologist may prefer that patient postpone colonoscopy  but will await his input.

## 2013-08-30 NOTE — Progress Notes (Signed)
Reviewed and agree with management plan.  Noga Fogg T. Lisvet Rasheed, MD FACG 

## 2013-09-02 ENCOUNTER — Telehealth: Payer: Self-pay | Admitting: Gastroenterology

## 2013-09-02 NOTE — Telephone Encounter (Signed)
He had an MI and a drug eluting stent placement on 12/16/2012. I recommend keeping him on Aspirin and Effient until 12/15/2013. After that, Effient can be stopped.  If colonoscopy is elective, I recommend postponing the procedure until after 12/15/2013

## 2013-09-02 NOTE — Telephone Encounter (Signed)
Spoke to pt. Told him Dr. Kirke Corin does not want him to have procedure until after jan, 2015. Pt said he has met his deductible for this year and will not be able to afford a colonoscopy otherwise. Pt state he has a follow up appointment with Dr. Kirke Corin this month and will talk to him about it. Pt does not want to cancel appointment for Colonoscopy until after he speaks with Dr. Kirke Corin.

## 2013-09-30 ENCOUNTER — Telehealth: Payer: Self-pay | Admitting: Gastroenterology

## 2013-09-30 ENCOUNTER — Encounter: Payer: Self-pay | Admitting: Cardiovascular Disease

## 2013-09-30 ENCOUNTER — Ambulatory Visit (INDEPENDENT_AMBULATORY_CARE_PROVIDER_SITE_OTHER): Payer: BC Managed Care – PPO | Admitting: Cardiovascular Disease

## 2013-09-30 VITALS — BP 138/78 | HR 50 | Ht 71.0 in | Wt 237.8 lb

## 2013-09-30 DIAGNOSIS — E785 Hyperlipidemia, unspecified: Secondary | ICD-10-CM

## 2013-09-30 DIAGNOSIS — I2510 Atherosclerotic heart disease of native coronary artery without angina pectoris: Secondary | ICD-10-CM

## 2013-09-30 DIAGNOSIS — I10 Essential (primary) hypertension: Secondary | ICD-10-CM

## 2013-09-30 NOTE — Progress Notes (Signed)
Primary care physician: Dr. Alphonsus Sias  HPI  This is a 57 year old male who is here today for a followup visit regarding coronary artery disease.  He has known history of coronary artery disease status post angioplasty and drug-eluting stent placement to the LAD in 2004. He presented to [facility] in January of 2014 with a small non-ST elevation myocardial infarction. Cardiac catheterization showed patent LAD stent, occluded right PDA distally supplying a small territory and 99% proximal OM 2 stenosis which was the culprit. He underwent a successful angioplasty and drug-eluting stent placement. Ejection fraction was normal. He has been doing reasonably well since hospital discharge. He has not had any recurrent chest pain. He does have chronic exertional dyspnea. He has strong family history of premature coronary artery disease. He reports no side effects with medication. He is scheduled to have colonoscopy done in December.  Allergies  Allergen Reactions  . Lisinopril     REACTION: cough with this and/or benazepril     Current Outpatient Prescriptions on File Prior to Visit  Medication Sig Dispense Refill  . amLODipine (NORVASC) 10 MG tablet TAKE ONE TABLET BY MOUTH EVERY DAY  90 tablet  3  . aspirin 81 MG tablet Take 1 tablet (81 mg total) by mouth daily.  30 tablet    . atorvastatin (LIPITOR) 80 MG tablet Take 1 tablet (80 mg total) by mouth daily at 6 PM.  30 tablet  11  . colchicine 0.6 MG tablet Take 0.6 mg by mouth 2 (two) times daily as needed. For gout flares      . losartan (COZAAR) 50 MG tablet TAKE ONE TABLET BY MOUTH EVERY DAY  90 tablet  3  . nitroGLYCERIN (NITROSTAT) 0.4 MG SL tablet Place 1 tablet (0.4 mg total) under the tongue every 5 (five) minutes as needed for chest pain.  25 tablet  3  . prasugrel (EFFIENT) 10 MG TABS TAKE ONE TABLET BY MOUTH ONCE DAILY  30 tablet  9  . Sod Picosulfate-Mag Ox-Cit Acd 10-3.5-12 MG-GM-GM PACK Take 1 kit by mouth once.  1 each  0     No current facility-administered medications on file prior to visit.     Past Medical History  Diagnosis Date  . Hypertension   . Hyperlipidemia   . Atypical chest pain   . Hemorrhoids   . Colon polyps   . Diverticulosis   . Gout   . Internal hemorrhoids without mention of complication   . MI (myocardial infarction)   . CAD (coronary artery disease)     a. 09/2003 Cath/PCI: LAD 80%->3.0x23 Cypher DES;  b. 03/2004 Cath: LM nl, LAD 20p, 1m, patent stent, D1 25, D2 small, 80/jailed, LCX 33m, OM1 nl, OM2 20, RCA 40-50p, PDA 54m;  c. Non-ST elevation MI in January of 2014. Cardiac cath showed patent LAD stent, included distal right PDA supplying a small territory, 99% proximal OM 2 status post PCI and DES placement      Past Surgical History  Procedure Laterality Date  . Cypher stent  10/04    mid LAD- gupta  . Cardiac catheterization  2014    s/p stent     Family History  Problem Relation Age of Onset  . Diabetes Father   . Coronary artery disease Father     CABG - 5 vessel 15 yr ago  . Hypertension Brother   . Hypertension Brother   . Arrhythmia Brother   . Heart attack Paternal Uncle  Age 11  . Heart attack Maternal Uncle     Age 59  . Coronary artery disease Maternal Grandfather   . Heart attack Maternal Grandfather     Died in 18s  . Throat cancer Maternal Grandfather   . Hypertension Mother      History   Social History  . Marital Status: Married    Spouse Name: N/A    Number of Children: 2  . Years of Education: N/A   Occupational History  .     [employer]   Social History Main Topics  . Smoking status: Never Smoker   . Smokeless tobacco: Never Used  . Alcohol Use: No  . Drug Use: No  . Sexual Activity: Not on file   Other Topics Concern  . Not on file   Social History Narrative   Engineer, water for [employer]           PHYSICAL EXAM   BP 138/78  Pulse 50  Ht 5\' 11"  (1.803 m)  Wt 237 lb 12 oz  (107.843 kg)  BMI 33.17 kg/m2 Constitutional: He is oriented to person, place, and time. He appears well-developed and well-nourished. No distress.  HENT: No nasal discharge.  Head: Normocephalic and atraumatic.  Eyes: Pupils are equal and round. Right eye exhibits no discharge. Left eye exhibits no discharge.  Neck: Normal range of motion. Neck supple. No JVD present. No thyromegaly present.  Cardiovascular: Normal rate, regular rhythm, normal heart sounds and. Exam reveals no gallop and no friction rub. No murmur heard.  Pulmonary/Chest: Effort normal and breath sounds normal. No stridor. No respiratory distress. He has no wheezes. He has no rales. He exhibits no tenderness.  Abdominal: Soft. Bowel sounds are normal. He exhibits no distension. There is no tenderness. There is no rebound and no guarding.  Musculoskeletal: Normal range of motion. He exhibits no edema and no tenderness.  Neurological: He is alert and oriented to person, place, and time. Coordination normal.  Skin: Skin is warm and dry. No rash noted. He is not diaphoretic. No erythema. No pallor.  Psychiatric: He has a normal mood and affect. His behavior is normal. Judgment and thought content normal.       EKG: Sinus  Bradycardia  WITHIN NORMAL LIMITS   ASSESSMENT AND PLAN

## 2013-09-30 NOTE — Patient Instructions (Signed)
Stop Effient 1 week before colonoscopy. Resume it after colonoscopy if OK with Dr. Russella Dar. Then continue to take it until January 17 and stop after that.   Your physician wants you to follow-up in: 6 months.  You will receive a reminder letter in the mail two months in advance. If you don't receive a letter, please call our office to schedule the follow-up appointment.

## 2013-10-06 ENCOUNTER — Other Ambulatory Visit: Payer: Self-pay

## 2013-10-07 NOTE — Telephone Encounter (Signed)
  10/07/2013   RE: Matthew Doyle DOB: 10-Mar-1956 MRN: [id number]   Dear Dr.  Kirke Corin,    We have scheduled the above patient for an endoscopic procedure. Our records show that he is on anticoagulation therapy.   Please advise as to how long the patient may come off his therapy of EFFIENT  prior to the procedure, which is scheduled for December. Patient called and said he saw you last week and you cleared him to come off the medication. He is to come off permanently or will he need to restart medication after his procedure? Please advise  Please fax back/ or route the completed form to Matthew Doyle/Matthew Doyle at [phone].   Sincerely,    [facility] Dr. Judie Petit T. Russella Dar

## 2013-10-09 ENCOUNTER — Encounter: Payer: Self-pay | Admitting: Cardiovascular Disease

## 2013-10-09 NOTE — Assessment & Plan Note (Signed)
Blood pressure is well controlled on current medications. 

## 2013-10-09 NOTE — Assessment & Plan Note (Signed)
He is doing very well with no recurrent angina on medical therapy. I explained to him that ideally he should continue dual antiplatelet therapy without interruption until January of 2015. However, there is now enough data showing safety of interrupting dual antiplatelet therapy 6 months after drug-eluting stent placement. Thus, Effient can be held 7 days before colonoscopy and resumed as soon as safe from a bleeding standpoint. He is to continue this until January of 2015.

## 2013-10-09 NOTE — Assessment & Plan Note (Signed)
Lab Results  Component Value Date   CHOL 146 12/17/2012   HDL 36* 03/31/2013   LDLCALC 47 03/31/2013   TRIG 42 03/31/2013   CHOLHDL 2.5 03/31/2013   Continue treatment with atorvastatin.

## 2013-10-09 NOTE — Telephone Encounter (Signed)
Please see my last office note. Thanks.

## 2013-10-10 ENCOUNTER — Telehealth: Payer: Self-pay | Admitting: Gastroenterology

## 2013-10-10 NOTE — Telephone Encounter (Signed)
Spoke to pt. Gave him Dr. Jari Sportsman recommendations regarding the hold of effient. He is to hold for 7 days prior to procedure then resume once it's ok and continue taking until January 2015. Pt verbalized understanding.

## 2013-10-17 ENCOUNTER — Encounter: Payer: Self-pay | Admitting: Gastroenterology

## 2013-10-31 HISTORY — PX: COLONOSCOPY: SHX174

## 2013-11-15 ENCOUNTER — Ambulatory Visit (AMBULATORY_SURGERY_CENTER): Payer: BC Managed Care – PPO | Admitting: Gastroenterology

## 2013-11-15 ENCOUNTER — Encounter: Payer: Self-pay | Admitting: Gastroenterology

## 2013-11-15 VITALS — BP 123/71 | HR 58 | Temp 96.8°F | Resp 18 | Ht 70.5 in | Wt 239.0 lb

## 2013-11-15 DIAGNOSIS — Z860100 Personal history of colon polyps, unspecified: Secondary | ICD-10-CM

## 2013-11-15 MED ORDER — SODIUM CHLORIDE 0.9 % IV SOLN: 500.0000 mL | INTRAVENOUS | Status: DC

## 2013-11-15 NOTE — Op Note (Signed)
[facility] [address] [place], [address]   COLONOSCOPY PROCEDURE REPORT  PATIENT: Matthew Doyle, Matthew Doyle  MR#: [id number] BIRTHDATE: 07-Apr-1956 , 57  yrs. old GENDER: Male ENDOSCOPIST: Meryl Dare, MD, [facility] PROCEDURE DATE:  11/15/2013 PROCEDURE:   Colonoscopy, surveillance First Screening Colonoscopy - Avg.  risk and is 50 yrs.  old or older - No.  Prior Negative Screening - Now for repeat screening. N/A  History of Adenoma - Now for follow-up colonoscopy & has been > or = to 3 yrs.  Yes hx of adenoma.  Has been 3 or more years since last colonoscopy.  Polyps Removed Today? No.  Recommend repeat exam, <10 yrs? Yes.  High risk (family or personal hx). ASA CLASS:   Class II INDICATIONS:Patient's personal history of colon polyps. MEDICATIONS: MAC sedation, administered by CRNA and propofol (Diprivan) 300mg  IV DESCRIPTION OF PROCEDURE:   After the risks benefits and alternatives of the procedure were thoroughly explained, informed consent was obtained.  A digital rectal exam revealed no abnormalities of the rectum.   The LB [id number]  endoscope was introduced through the anus and advanced to the cecum, which was identified by both the appendix and ileocecal valve. No adverse events experienced.   The quality of the prep was [employer] The instrument was then slowly withdrawn as the colon was fully examined.  COLON FINDINGS: Mild diverticulosis was noted in the sigmoid colon. The colon was otherwise normal.  There was no diverticulosis, inflammation, polyps or cancers unless previously stated. Retroflexed views revealed small internal hemorrhoids. The time to cecum=2 minutes 07 seconds.  Withdrawal time=10 minutes 57 seconds. The scope was withdrawn and the procedure completed.  COMPLICATIONS: There were no complications.  ENDOSCOPIC IMPRESSION: 1.   Mild diverticulosis was noted in the sigmoid colon 2.   Small internal  hemorrhoids  RECOMMENDATIONS: 1.  High fiber diet with liberal fluid intake. 2.  Repeat Colonoscopy in 5 years.  eSigned:  Meryl Dare, MD, [facility] 11/15/2013 9:05 AM

## 2013-11-15 NOTE — Progress Notes (Signed)
Patient did not have preoperative order for IV antibiotic SSI prophylaxis. (G8918)  Patient did not experience any of the following events: a burn prior to discharge; a fall within the facility; wrong site/side/patient/procedure/implant event; or a hospital transfer or hospital admission upon discharge from the facility. (G8907)  

## 2013-11-15 NOTE — Patient Instructions (Signed)

## 2013-11-16 ENCOUNTER — Telehealth: Payer: Self-pay | Admitting: *Deleted

## 2013-11-16 NOTE — Telephone Encounter (Signed)
  Follow up Call-  Call back number 11/15/2013  Post procedure Call Back phone  # [phone]  Permission to leave phone message Yes     Patient questions:  Do you have a fever, pain , or abdominal swelling? no Pain Score  0 *  Have you tolerated food without any problems? yes  Have you been able to return to your normal activities? yes  Do you have any questions about your discharge instructions: Diet   no Medications  no Follow up visit  no  Do you have questions or concerns about your Care? no  Actions: * If pain score is 4 or above: No action needed, pain <4.

## 2014-01-18 ENCOUNTER — Other Ambulatory Visit: Payer: Self-pay

## 2014-01-18 MED ORDER — ATORVASTATIN CALCIUM 80 MG PO TABS: 80.0000 mg | ORAL_TABLET | Freq: Every day | ORAL | Status: DC

## 2014-03-31 ENCOUNTER — Ambulatory Visit (INDEPENDENT_AMBULATORY_CARE_PROVIDER_SITE_OTHER): Payer: BC Managed Care – PPO | Admitting: Cardiovascular Disease

## 2014-03-31 ENCOUNTER — Encounter: Payer: Self-pay | Admitting: Cardiovascular Disease

## 2014-03-31 VITALS — BP 120/98 | HR 51 | Ht 72.0 in | Wt 243.8 lb

## 2014-03-31 DIAGNOSIS — I2510 Atherosclerotic heart disease of native coronary artery without angina pectoris: Secondary | ICD-10-CM

## 2014-03-31 DIAGNOSIS — I10 Essential (primary) hypertension: Secondary | ICD-10-CM

## 2014-03-31 DIAGNOSIS — E785 Hyperlipidemia, unspecified: Secondary | ICD-10-CM

## 2014-03-31 DIAGNOSIS — I214 Non-ST elevation (NSTEMI) myocardial infarction: Secondary | ICD-10-CM

## 2014-03-31 NOTE — Progress Notes (Signed)
Primary care physician: Dr. Silvio Pate  HPI  This is a 58 year old male who is here today for a followup visit regarding coronary artery disease.  He has known history of coronary artery disease status post angioplasty and drug-eluting stent placement to the LAD in 2004. He presented to [facility] in January of 2014 with a small non-ST elevation myocardial infarction. Cardiac catheterization showed patent LAD stent, occluded right PDA distally supplying a small territory and 99% proximal OM 2 stenosis which was the culprit. He underwent a successful angioplasty and drug-eluting stent placement. Ejection fraction was normal. He has been doing reasonably well since then.   He underwent colonoscopy in January without complications. He denies any chest pain, dyspnea or palpitations.   Allergies  Allergen Reactions  . Lisinopril     REACTION: cough with this and/or benazepril     Current Outpatient Prescriptions on File Prior to Visit  Medication Sig Dispense Refill  . amLODipine (NORVASC) 10 MG tablet TAKE ONE TABLET BY MOUTH EVERY DAY  90 tablet  3  . aspirin 81 MG tablet Take 1 tablet (81 mg total) by mouth daily.  30 tablet    . atorvastatin (LIPITOR) 80 MG tablet Take 1 tablet (80 mg total) by mouth daily at 6 PM.  30 tablet  11  . colchicine 0.6 MG tablet Take 0.6 mg by mouth 2 (two) times daily as needed. For gout flares      . losartan (COZAAR) 50 MG tablet TAKE ONE TABLET BY MOUTH EVERY DAY  90 tablet  3  . nitroGLYCERIN (NITROSTAT) 0.4 MG SL tablet Place 1 tablet (0.4 mg total) under the tongue every 5 (five) minutes as needed for chest pain.  25 tablet  3   No current facility-administered medications on file prior to visit.     Past Medical History  Diagnosis Date  . Hypertension   . Hyperlipidemia   . Atypical chest pain   . Hemorrhoids   . Colon polyps   . Diverticulosis   . Gout   . Internal hemorrhoids without mention of complication   . CAD (coronary artery  disease)     a. 09/2003 Cath/PCI: LAD 80%->3.0x23 Cypher DES;  b. 03/2004 Cath: LM nl, LAD 20p, 6m, patent stent, D1 25, D2 small, 80/jailed, LCX 93m, OM1 nl, OM2 20, RCA 40-50p, PDA 59m;  c. Non-ST elevation MI in January of 2014. Cardiac cath showed patent LAD stent, included distal right PDA supplying a small territory, 99% proximal OM 2 status post PCI and DES placement   . Arthritis     knee  . MI (myocardial infarction)     last one- 12-16-12     Past Surgical History  Procedure Laterality Date  . Cypher stent  10/04    mid LAD- gupta  . Cardiac catheterization  2014    s/p stent     Family History  Problem Relation Age of Onset  . Diabetes Father   . Coronary artery disease Father     CABG - 5 vessel 15 yr ago  . Hypertension Brother   . Hypertension Brother   . Arrhythmia Brother   . Heart attack Paternal Uncle     Age 60  . Heart attack Maternal Uncle     Age 4  . Coronary artery disease Maternal Grandfather   . Heart attack Maternal Grandfather     Died in 42s  . Throat cancer Maternal Grandfather   . Hypertension Mother   .  Colon cancer Neg Hx   . Esophageal cancer Neg Hx   . Rectal cancer Neg Hx   . Stomach cancer Neg Hx      History   Social History  . Marital Status: Married    Spouse Name: N/A    Number of Children: 2  . Years of Education: N/A   Occupational History  .     [employer] History Main Topics  . Smoking status: Never Smoker   . Smokeless tobacco: Never Used  . Alcohol Use: No  . Drug Use: No  . Sexual Activity: Not on file   Other Topics Concern  . Not on file   Social History Narrative   Social research officer, government for [employer]   BP 120/98  Pulse 51  Ht 6' (1.829 m)  Wt 243 lb 12 oz (110.564 kg)  BMI 33.05 kg/m2 Constitutional: He is oriented to person, place, and time. He appears well-developed and well-nourished. No distress.  HENT: No nasal discharge.    Head: Normocephalic and atraumatic.  Eyes: Pupils are equal and round. Right eye exhibits no discharge. Left eye exhibits no discharge.  Neck: Normal range of motion. Neck supple. No JVD present. No thyromegaly present.  Cardiovascular: Normal rate, regular rhythm, normal heart sounds and. Exam reveals no gallop and no friction rub. No murmur heard.  Pulmonary/Chest: Effort normal and breath sounds normal. No stridor. No respiratory distress. He has no wheezes. He has no rales. He exhibits no tenderness.  Abdominal: Soft. Bowel sounds are normal. He exhibits no distension. There is no tenderness. There is no rebound and no guarding.  Musculoskeletal: Normal range of motion. He exhibits no edema and no tenderness.  Neurological: He is alert and oriented to person, place, and time. Coordination normal.  Skin: Skin is warm and dry. No rash noted. He is not diaphoretic. No erythema. No pallor.  Psychiatric: He has a normal mood and affect. His behavior is normal. Judgment and thought content normal.       EKG: Sinus  Bradycardia  WITHIN NORMAL LIMITS   ASSESSMENT AND PLAN

## 2014-03-31 NOTE — Assessment & Plan Note (Signed)
Blood pressure is well controlled on current medication.

## 2014-03-31 NOTE — Assessment & Plan Note (Signed)
Lab Results  Component Value Date   CHOL 146 12/17/2012   HDL 36* 03/31/2013   LDLCALC 47 03/31/2013   TRIG 42 03/31/2013   CHOLHDL 2.5 03/31/2013   Continue treatment with high dose atorvastatin. I recommend checking fasting lipid and liver profile with his next labs. He is going to see Dr. Silvio Pate soon for a physical. Labs can be done at that time.

## 2014-03-31 NOTE — Assessment & Plan Note (Addendum)
He is doing very well at this time with no symptoms suggestive of angina. Continue medical therapy. Followup on a yearly basis or earlier if needed.

## 2014-03-31 NOTE — Patient Instructions (Addendum)
Continue same medications.   Your physician wants you to follow-up in: 12 months.  You will receive a reminder letter in the mail two months in advance. If you don't receive a letter, please call our office to schedule the follow-up appointment.

## 2014-05-26 ENCOUNTER — Encounter: Payer: Self-pay | Admitting: Internal Medicine

## 2014-05-26 ENCOUNTER — Ambulatory Visit (INDEPENDENT_AMBULATORY_CARE_PROVIDER_SITE_OTHER): Payer: BC Managed Care – PPO | Admitting: Internal Medicine

## 2014-05-26 VITALS — BP 120/80 | HR 60 | Temp 97.8°F | Ht 71.0 in | Wt 245.0 lb

## 2014-05-26 DIAGNOSIS — R7301 Impaired fasting glucose: Secondary | ICD-10-CM

## 2014-05-26 DIAGNOSIS — I2584 Coronary atherosclerosis due to calcified coronary lesion: Secondary | ICD-10-CM

## 2014-05-26 DIAGNOSIS — I2510 Atherosclerotic heart disease of native coronary artery without angina pectoris: Secondary | ICD-10-CM

## 2014-05-26 DIAGNOSIS — Z0000 Encounter for general adult medical examination without abnormal findings: Secondary | ICD-10-CM

## 2014-05-26 DIAGNOSIS — E785 Hyperlipidemia, unspecified: Secondary | ICD-10-CM

## 2014-05-26 DIAGNOSIS — M109 Gout, unspecified: Secondary | ICD-10-CM

## 2014-05-26 DIAGNOSIS — I10 Essential (primary) hypertension: Secondary | ICD-10-CM

## 2014-05-26 DIAGNOSIS — Z125 Encounter for screening for malignant neoplasm of prostate: Secondary | ICD-10-CM

## 2014-05-26 LAB — COMPREHENSIVE METABOLIC PANEL
ALBUMIN: 4.3 g/dL (ref 3.5–5.2)
ALT: 29 U/L (ref 0–53)
AST: 23 U/L (ref 0–37)
Alkaline Phosphatase: 69 U/L (ref 39–117)
BUN: 16 mg/dL (ref 6–23)
CALCIUM: 9.6 mg/dL (ref 8.4–10.5)
CHLORIDE: 104 meq/L (ref 96–112)
CO2: 32 mEq/L (ref 19–32)
CREATININE: 1 mg/dL (ref 0.4–1.5)
GFR: 86.34 mL/min (ref >60.00)
Glucose, Bld: 104 mg/dL — ABNORMAL HIGH (ref 70–99)
POTASSIUM: 4.3 meq/L (ref 3.5–5.1)
Sodium: 140 mEq/L (ref 135–145)
Total Bilirubin: 0.8 mg/dL (ref 0.2–1.2)
Total Protein: 7.8 g/dL (ref 6.0–8.3)

## 2014-05-26 LAB — CBC WITH DIFFERENTIAL/PLATELET
BASOS ABS: 0 10*3/uL (ref 0.0–0.1)
Basophils Relative: 0.4 % (ref 0.0–3.0)
EOS ABS: 0.2 10*3/uL (ref 0.0–0.7)
Eosinophils Relative: 2.8 % (ref 0.0–5.0)
HCT: 47.2 % (ref 39.0–52.0)
Hemoglobin: 16 g/dL (ref 13.0–17.0)
LYMPHS ABS: 2 10*3/uL (ref 0.7–4.0)
LYMPHS PCT: 24.1 % (ref 12.0–46.0)
MCHC: 34 g/dL (ref 30.0–36.0)
MCV: 85.6 fl (ref 78.0–100.0)
MONO ABS: 0.9 10*3/uL (ref 0.1–1.0)
Monocytes Relative: 11 % (ref 3.0–12.0)
NEUTROS ABS: 5 10*3/uL (ref 1.4–7.7)
Neutrophils Relative %: 61.7 % (ref 43.0–77.0)
Platelets: 205 10*3/uL (ref 150.0–400.0)
RBC: 5.52 Mil/uL (ref 4.22–5.81)
RDW: 13.9 % (ref 11.5–15.5)
WBC: 8.1 10*3/uL (ref 4.0–10.5)

## 2014-05-26 LAB — PSA: PSA: 1.25 ng/mL (ref 0.10–4.00)

## 2014-05-26 LAB — LIPID PANEL
CHOL/HDL RATIO: 3
CHOLESTEROL: 119 mg/dL (ref 0–200)
HDL: 35.5 mg/dL — ABNORMAL LOW (ref >39.00)
LDL CALC: 72 mg/dL (ref 0–99)
NonHDL: 83.5
TRIGLYCERIDES: 57 mg/dL (ref 0.0–149.0)
VLDL: 11.4 mg/dL (ref 0.0–40.0)

## 2014-05-26 LAB — T4, FREE: Free T4: 0.85 ng/dL (ref 0.60–1.60)

## 2014-05-26 LAB — HEMOGLOBIN A1C: Hgb A1c MFr Bld: 6.4 % (ref 4.6–6.5)

## 2014-05-26 LAB — TSH: TSH: 2.19 u[IU]/mL (ref 0.35–4.50)

## 2014-05-26 NOTE — Assessment & Plan Note (Signed)
Only if extended sedentary times Doesn't use colchicine often

## 2014-05-26 NOTE — Assessment & Plan Note (Signed)
BP Readings from Last 3 Encounters:  05/26/14 120/80  03/31/14 120/98  11/15/13 123/71   Good control

## 2014-05-26 NOTE — Progress Notes (Signed)
Pre visit review using our clinic review tool, if applicable. No additional management support is needed unless otherwise documented below in the visit note. 

## 2014-05-26 NOTE — Assessment & Plan Note (Signed)
Healthy but out of shape Lifestyle info given Will check PSA after discussion

## 2014-05-26 NOTE — Patient Instructions (Signed)
Exercise to Lose Weight Exercise and a healthy diet may help you lose weight. Your doctor may suggest specific exercises. EXERCISE IDEAS AND TIPS  Choose low-cost things you enjoy doing, such as walking, bicycling, or exercising to workout videos.  Take stairs instead of the elevator.  Walk during your lunch break.  Park your car further away from work or school.  Go to a gym or an exercise class.  Start with 5 to 10 minutes of exercise each day. Build up to 30 minutes of exercise 4 to 6 days a week.  Wear shoes with good support and comfortable clothes.  Stretch before and after working out.  Work out until you breathe harder and your heart beats faster.  Drink extra water when you exercise.  Do not do so much that you hurt yourself, feel dizzy, or get very short of breath. Exercises that burn about 150 calories:  Running 1  miles in 15 minutes.  Playing volleyball for 45 to 60 minutes.  Washing and waxing a car for 45 to 60 minutes.  Playing touch football for 45 minutes.  Walking 1  miles in 35 minutes.  Pushing a stroller 1  miles in 30 minutes.  Playing basketball for 30 minutes.  Raking leaves for 30 minutes.  Bicycling 5 miles in 30 minutes.  Walking 2 miles in 30 minutes.  Dancing for 30 minutes.  Shoveling snow for 15 minutes.  Swimming laps for 20 minutes.  Walking up stairs for 15 minutes.  Bicycling 4 miles in 15 minutes.  Gardening for 30 to 45 minutes.  Jumping rope for 15 minutes.  Washing windows or floors for 45 to 60 minutes. Document Released: 12/20/2010 Document Revised: 02/09/2012 Document Reviewed: 12/20/2010 [facility] Patient Information 2015 [place], [place]. This information is not intended to replace advice given to you by your health care provider. Make sure you discuss any questions you have with your health care provider. DASH Eating Plan DASH stands for "Dietary Approaches to Stop Hypertension." The DASH eating plan is a  healthy eating plan that has been shown to reduce high blood pressure (hypertension). Additional health benefits may include reducing the risk of type 2 diabetes mellitus, heart disease, and stroke. The DASH eating plan may also help with weight loss. WHAT DO I NEED TO KNOW ABOUT THE DASH EATING PLAN? For the DASH eating plan, you will follow these general guidelines:  Choose foods with a percent daily value for sodium of less than 5% (as listed on the food label).  Use salt-free seasonings or herbs instead of table salt or sea salt.  Check with your health care provider or pharmacist before using salt substitutes.  Eat lower-sodium products, often labeled as "lower sodium" or "no salt added."  Eat fresh foods.  Eat more vegetables, fruits, and low-fat dairy products.  Choose whole grains. Look for the word "whole" as the first word in the ingredient list.  Choose fish and skinless chicken or [place] more often than red meat. Limit fish, poultry, and meat to 6 oz (170 g) each day.  Limit sweets, desserts, sugars, and sugary drinks.  Choose heart-healthy fats.  Limit cheese to 1 oz (28 g) per day.  Eat more home-cooked food and less restaurant, buffet, and fast food.  Limit fried foods.  Cook foods using methods other than frying.  Limit canned vegetables. If you do use them, rinse them well to decrease the sodium.  When eating at a restaurant, ask that your food be prepared with  less salt, or no salt if possible. WHAT FOODS CAN I EAT? Seek help from a dietitian for individual calorie needs. Grains Whole grain or whole wheat bread. Brown rice. Whole grain or whole wheat pasta. Quinoa, bulgur, and whole grain cereals. Low-sodium cereals. Corn or whole wheat flour tortillas. Whole grain cornbread. Whole grain crackers. Low-sodium crackers. Vegetables Fresh or frozen vegetables (raw, steamed, roasted, or grilled). Low-sodium or reduced-sodium tomato and vegetable juices. Low-sodium  or reduced-sodium tomato sauce and paste. Low-sodium or reduced-sodium canned vegetables.  Fruits All fresh, canned (in natural juice), or frozen fruits. Meat and Other Protein Products Ground beef (85% or leaner), grass-fed beef, or beef trimmed of fat. Skinless chicken or [place]. Ground chicken or [place]. Pork trimmed of fat. All fish and seafood. Eggs. Dried beans, peas, or lentils. Unsalted nuts and seeds. Unsalted canned beans. Dairy Low-fat dairy products, such as skim or 1% milk, 2% or reduced-fat cheeses, low-fat ricotta or cottage cheese, or plain low-fat yogurt. Low-sodium or reduced-sodium cheeses. Fats and Oils Tub margarines without trans fats. Light or reduced-fat mayonnaise and salad dressings (reduced sodium). Avocado. Safflower, olive, or canola oils. Natural peanut or almond butter. Other Unsalted popcorn and pretzels. The items listed above may not be a complete list of recommended foods or beverages. Contact your dietitian for more options. WHAT FOODS ARE NOT RECOMMENDED? Grains White bread. White pasta. White rice. Refined cornbread. Bagels and croissants. Crackers that contain trans fat. Vegetables Creamed or fried vegetables. Vegetables in a cheese sauce. Regular canned vegetables. Regular canned tomato sauce and paste. Regular tomato and vegetable juices. Fruits Dried fruits. Canned fruit in light or heavy syrup. Fruit juice. Meat and Other Protein Products Fatty cuts of meat. Ribs, chicken wings, bacon, sausage, bologna, salami, chitterlings, fatback, hot dogs, bratwurst, and packaged luncheon meats. Salted nuts and seeds. Canned beans with salt. Dairy Whole or 2% milk, cream, half-and-half, and cream cheese. Whole-fat or sweetened yogurt. Full-fat cheeses or blue cheese. Nondairy creamers and whipped toppings. Processed cheese, cheese spreads, or cheese curds. Condiments Onion and garlic salt, seasoned salt, table salt, and sea salt. Canned and packaged gravies.  Worcestershire sauce. Tartar sauce. Barbecue sauce. Teriyaki sauce. Soy sauce, including reduced sodium. Steak sauce. Fish sauce. Oyster sauce. Cocktail sauce. Horseradish. Ketchup and mustard. Meat flavorings and tenderizers. Bouillon cubes. Hot sauce. Tabasco sauce. Marinades. Taco seasonings. Relishes. Fats and Oils Butter, stick margarine, lard, shortening, ghee, and bacon fat. Coconut, palm kernel, or palm oils. Regular salad dressings. Other Pickles and olives. Salted popcorn and pretzels. The items listed above may not be a complete list of foods and beverages to avoid. Contact your dietitian for more information. WHERE CAN I FIND MORE INFORMATION? National Heart, Lung, and Blood Institute: travelstabloid.com Document Released: 11/06/2011 Document Revised: 11/22/2013 Document Reviewed: 09/21/2013 [facility] Patient Information 2015 [place], [place]. This information is not intended to replace advice given to you by your health care provider. Make sure you discuss any questions you have with your health care provider.

## 2014-05-26 NOTE — Progress Notes (Signed)
Subjective:    Patient ID: Matthew Doyle, male    DOB: 06-27-1956, 58 y.o.   MRN: [id number]  HPI Here for physical No new concerns Has gained back his 8#---- family issues with wife's health, has 54 year old grandson with Gaucher's disease now living with them (son and wife divorced).  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 amLODipine (NORVASC) 10 MG tablet TAKE ONE TABLET BY MOUTH EVERY DAY  90 tablet  3  . aspirin 81 MG tablet Take 1 tablet (81 mg total) by mouth daily.  30 tablet    . atorvastatin (LIPITOR) 80 MG tablet Take 1 tablet (80 mg total) by mouth daily at 6 PM.  30 tablet  11  . colchicine 0.6 MG tablet Take 0.6 mg by mouth 2 (two) times daily as needed. For gout flares      . losartan (COZAAR) 50 MG tablet TAKE ONE TABLET BY MOUTH EVERY DAY  90 tablet  3  . nitroGLYCERIN (NITROSTAT) 0.4 MG SL tablet Place 1 tablet (0.4 mg total) under the tongue every 5 (five) minutes as needed for chest pain.  25 tablet  3   No current facility-administered medications on file prior to visit.    Allergies  Allergen Reactions  . Lisinopril     REACTION: cough with this and/or benazepril    Past Medical History  Diagnosis Date  . Hypertension   . Hyperlipidemia   . Atypical chest pain   . Hemorrhoids   . Colon polyps   . Diverticulosis   . Gout   . Internal hemorrhoids without mention of complication   . CAD (coronary artery disease)     a. 09/2003 Cath/PCI: LAD 80%->3.0x23 Cypher DES;  b. 03/2004 Cath: LM nl, LAD 20p, 46m, patent stent, D1 25, D2 small, 80/jailed, LCX 31m, OM1 nl, OM2 20, RCA 40-50p, PDA 29m;  c. Non-ST elevation MI in January of 2014. Cardiac cath showed patent LAD stent, included distal right PDA supplying  a small territory, 99% proximal OM 2 status post PCI and DES placement   . Arthritis     knee  . MI (myocardial infarction)     last one- 12-16-12    Past Surgical History  Procedure Laterality Date  . Cypher stent  10/04    mid LAD- gupta  . Cardiac catheterization  2014    s/p stent    Family History  Problem Relation Age of Onset  . Diabetes Father   . Coronary artery disease Father     CABG - 5 vessel 15 yr ago  . Hypertension Brother   . Hypertension Brother   . Arrhythmia Brother   . Heart attack Paternal Uncle     Age 24  . Heart attack Maternal Uncle     Age 47  . Coronary artery disease Maternal Grandfather   . Heart attack Maternal Grandfather     Died in 1s  . Throat cancer Maternal Grandfather   . Hypertension Mother   . Colon cancer Neg Hx   . Esophageal cancer Neg Hx   . Rectal cancer Neg Hx   . Stomach cancer Neg Hx     History   Social History  . Marital Status: Married  Spouse Name: N/A    Number of Children: 2  . Years of Education: N/A   Occupational History  .     [employer] History Main Topics  . Smoking status: Never Smoker   . Smokeless tobacco: Never Used  . Alcohol Use: No  . Drug Use: No  . Sexual Activity: Not on file   Other Topics Concern  . Not on file   Social History Narrative   Social research officer, government for [employer]            Review of Systems  Constitutional: Positive for unexpected weight change. Negative for fatigue.       Wears seat belt  HENT: Positive for dental problem, hearing loss and tinnitus.        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       No diplopia or unilateral vision loss  Respiratory: Negative for cough, chest tightness and shortness of breath.   Cardiovascular: Negative for chest pain, palpitations and leg swelling.  Gastrointestinal: Negative for nausea, vomiting, abdominal pain,  constipation and blood in stool.       No heartburn  Endocrine: Negative for cold intolerance and heat intolerance.  Genitourinary: Negative for urgency, frequency and difficulty urinating.       No sexual problems  Musculoskeletal: Positive for arthralgias. Negative for back pain and joint swelling.       Chronic knee pain--rarely uses meds  Skin: Negative for rash.       No suspicious lesions  Allergic/Immunologic: Negative for environmental allergies and immunocompromised state.  Neurological: Negative for dizziness, syncope, weakness, light-headedness, numbness and headaches.  Hematological: Negative for adenopathy. Does not bruise/bleed easily.  Psychiatric/Behavioral: Negative for sleep disturbance and dysphoric mood. The patient is not nervous/anxious.        Objective:   Physical Exam  Constitutional: He is oriented to person, place, and time. He appears well-developed and well-nourished. No distress.  HENT:  Head: Normocephalic and atraumatic.  Right Ear: External ear normal.  Left Ear: External ear normal.  Mouth/Throat: Oropharynx is clear and moist. No oropharyngeal exudate.  Eyes: Conjunctivae and EOM are normal. Pupils are equal, round, and reactive to light.  Neck: Normal range of motion. Neck supple. No thyromegaly present.  Cardiovascular: Normal rate, regular rhythm, normal heart sounds and intact distal pulses.  Exam reveals no gallop.   No murmur heard. Pulmonary/Chest: Effort normal and breath sounds normal. No respiratory distress. He has no wheezes. He has no rales.  Abdominal: Soft. There is no tenderness.  Musculoskeletal: He exhibits no edema and no tenderness.  Lymphadenopathy:    He has no cervical adenopathy.  Neurological: He is alert and oriented to person, place, and time.  Skin: No rash noted. No erythema.  Psychiatric: He has a normal mood and affect. His behavior is normal.          Assessment & Plan:

## 2014-05-26 NOTE — Assessment & Plan Note (Signed)
No problems with statin 

## 2014-05-26 NOTE — Assessment & Plan Note (Signed)
Doing well now No beta blocker due to low heart rate No changes needed

## 2014-05-29 ENCOUNTER — Telehealth: Payer: Self-pay | Admitting: Internal Medicine

## 2014-05-29 NOTE — Telephone Encounter (Signed)
Relevant patient education mailed to patient.  

## 2014-07-11 ENCOUNTER — Other Ambulatory Visit: Payer: Self-pay | Admitting: Internal Medicine

## 2014-07-14 ENCOUNTER — Other Ambulatory Visit: Payer: Self-pay | Admitting: Internal Medicine

## 2014-11-02 ENCOUNTER — Other Ambulatory Visit: Payer: Self-pay | Admitting: Internal Medicine

## 2014-11-09 ENCOUNTER — Encounter (HOSPITAL_COMMUNITY): Payer: Self-pay | Admitting: Cardiology

## 2015-02-12 ENCOUNTER — Other Ambulatory Visit: Payer: Self-pay | Admitting: Cardiovascular Disease

## 2015-02-12 ENCOUNTER — Other Ambulatory Visit: Payer: Self-pay | Admitting: Internal Medicine

## 2015-04-05 ENCOUNTER — Encounter: Payer: Self-pay | Admitting: Cardiovascular Disease

## 2015-04-05 ENCOUNTER — Ambulatory Visit (INDEPENDENT_AMBULATORY_CARE_PROVIDER_SITE_OTHER): Payer: BC Managed Care – PPO | Admitting: Cardiovascular Disease

## 2015-04-05 VITALS — BP 152/92 | HR 57 | Ht 72.0 in | Wt 256.8 lb

## 2015-04-05 DIAGNOSIS — I2510 Atherosclerotic heart disease of native coronary artery without angina pectoris: Secondary | ICD-10-CM

## 2015-04-05 DIAGNOSIS — E785 Hyperlipidemia, unspecified: Secondary | ICD-10-CM | POA: Diagnosis not present

## 2015-04-05 DIAGNOSIS — I2584 Coronary atherosclerosis due to calcified coronary lesion: Secondary | ICD-10-CM | POA: Diagnosis not present

## 2015-04-05 DIAGNOSIS — I10 Essential (primary) hypertension: Secondary | ICD-10-CM

## 2015-04-05 MED ORDER — LOSARTAN POTASSIUM 100 MG PO TABS: 100.0000 mg | ORAL_TABLET | Freq: Every day | ORAL | Status: DC

## 2015-04-05 NOTE — Assessment & Plan Note (Signed)
Lab Results  Component Value Date   CHOL 119 05/26/2014   HDL 35.50* 05/26/2014   LDLCALC 72 05/26/2014   TRIG 57.0 05/26/2014   CHOLHDL 3 05/26/2014   Continue treatment with atorvastatin. He is due for his annual physical with Dr. Silvio Pate in the near future. I recommend that he gets fasting lipid and liver profile at that time.

## 2015-04-05 NOTE — Assessment & Plan Note (Signed)
He is doing very well overall with no symptoms suggestive of angina. Continue medical therapy. I had a discussion with him about the importance of lifestyle changes including diet, exercise and weight loss. He gradually gained 12 pounds over the last year.

## 2015-04-05 NOTE — Progress Notes (Signed)
Primary care physician: Dr. Silvio Pate  HPI  This is a 59 year old male who is here today for a followup visit regarding coronary artery disease.  He has known history of coronary artery disease status post angioplasty and drug-eluting stent placement to the LAD in 2004. He presented to [facility] in January of 2014 with a small non-ST elevation myocardial infarction. Cardiac catheterization showed patent LAD stent, occluded right PDA distally supplying a small territory and 99% proximal OM 2 stenosis which was the culprit. He underwent a successful angioplasty and drug-eluting stent placement. Ejection fraction was normal. He has been doing reasonably well since then.   He denies any chest pain or shortness of breath. He gained 12 pounds since last year gradually. He has been taking his medications regularly. He has chronic bradycardia but he is asymptomatic.   Allergies  Allergen Reactions  . Lisinopril     REACTION: cough with this and/or benazepril     Current Outpatient Prescriptions on File Prior to Visit  Medication Sig Dispense Refill  . amLODipine (NORVASC) 10 MG tablet TAKE ONE TABLET BY MOUTH ONCE DAILY 90 tablet 0  . aspirin 81 MG tablet Take 1 tablet (81 mg total) by mouth daily. 30 tablet   . atorvastatin (LIPITOR) 80 MG tablet TAKE ONE TABLET BY MOUTH ONCE DAILY AT  6PM 30 tablet 6  . COLCRYS 0.6 MG tablet TAKE ONE TABLET BY MOUTH TWICE DAILY AS NEEDED 60 tablet 0  . losartan (COZAAR) 50 MG tablet TAKE ONE TABLET BY MOUTH ONCE DAILY 90 tablet 0  . nitroGLYCERIN (NITROSTAT) 0.4 MG SL tablet Place 1 tablet (0.4 mg total) under the tongue every 5 (five) minutes as needed for chest pain. 25 tablet 3   No current facility-administered medications on file prior to visit.     Past Medical History  Diagnosis Date  . Hypertension   . Hyperlipidemia   . Atypical chest pain   . Hemorrhoids   . Colon polyps   . Diverticulosis   . Gout   . Internal hemorrhoids without  mention of complication   . CAD (coronary artery disease)     a. 09/2003 Cath/PCI: LAD 80%->3.0x23 Cypher DES;  b. 03/2004 Cath: LM nl, LAD 20p, 5m, patent stent, D1 25, D2 small, 80/jailed, LCX 60m, OM1 nl, OM2 20, RCA 40-50p, PDA 83m;  c. Non-ST elevation MI in January of 2014. Cardiac cath showed patent LAD stent, included distal right PDA supplying a small territory, 99% proximal OM 2 status post PCI and DES placement   . Arthritis     knee  . MI (myocardial infarction)     last one- 12-16-12     Past Surgical History  Procedure Laterality Date  . Cypher stent  10/04    mid LAD- gupta  . Cardiac catheterization  2014    s/p stent  . Left heart catheterization with coronary angiogram N/A 12/17/2012    Procedure: LEFT HEART CATHETERIZATION WITH CORONARY ANGIOGRAM;  Surgeon: Peter M Martinique, MD;  Location: [facility] CATH LAB;  Service: Cardiovascular;  Laterality: N/A;  . Percutaneous coronary stent intervention (pci-s) Right 12/17/2012    Procedure: PERCUTANEOUS CORONARY STENT INTERVENTION (PCI-S);  Surgeon: Peter M Martinique, MD;  Location[facility] C[facility] LAB;  Service: Cardiovascular;  Laterality: Right;     Family History  Problem Relation Age of Onset  . Diabetes Father   . Coronary artery disease Father     CABG - 5 vessel 15 yr ago  .  Hypertension Brother   . Hypertension Brother   . Arrhythmia Brother   . Heart attack Paternal Uncle     Age 87  . Heart attack Maternal Uncle     Age 11  . Coronary artery disease Maternal Grandfather   . Heart attack Maternal Grandfather     Died in 57s  . Throat cancer Maternal Grandfather   . Hypertension Mother   . Colon cancer Neg Hx   . Esophageal cancer Neg Hx   . Rectal cancer Neg Hx   . Stomach cancer Neg Hx      History   Social History  . Marital Status: Married    Spouse Name: N/A  . Number of Children: 2  . Years of Education: N/A   Occupational History  .     [employer] History Main Topics  .  Smoking status: Never Smoker   . Smokeless tobacco: Never Used  . Alcohol Use: No  . Drug Use: No  . Sexual Activity: Not on file   Other Topics Concern  . Not on file   Social History Narrative   Social research officer, government for [employer]   BP 152/92 mmHg  Pulse 57  Ht 6' (1.829 m)  Wt 256 lb 12 oz (116.461 kg)  BMI 34.81 kg/m2 Constitutional: He is oriented to person, place, and time. He appears well-developed and well-nourished. No distress.  HENT: No nasal discharge.  Head: Normocephalic and atraumatic.  Eyes: Pupils are equal and round. Right eye exhibits no discharge. Left eye exhibits no discharge.  Neck: Normal range of motion. Neck supple. No JVD present. No thyromegaly present.  Cardiovascular: Normal rate, regular rhythm, normal heart sounds and. Exam reveals no gallop and no friction rub. No murmur heard.  Pulmonary/Chest: Effort normal and breath sounds normal. No stridor. No respiratory distress. He has no wheezes. He has no rales. He exhibits no tenderness.  Abdominal: Soft. Bowel sounds are normal. He exhibits no distension. There is no tenderness. There is no rebound and no guarding.  Musculoskeletal: Normal range of motion. He exhibits no edema and no tenderness.  Neurological: He is alert and oriented to person, place, and time. Coordination normal.  Skin: Skin is warm and dry. No rash noted. He is not diaphoretic. No erythema. No pallor.  Psychiatric: He has a normal mood and affect. His behavior is normal. Judgment and thought content normal.       EKG: Sinus  Bradycardia  WITHIN NORMAL LIMITS   ASSESSMENT AND PLAN

## 2015-04-05 NOTE — Assessment & Plan Note (Signed)
Blood pressure is elevated today. I repeated his blood pressure at the end of the visit and it was 142/90. Thus, I increased the dose of losartan to 100 mg once daily.

## 2015-04-05 NOTE — Patient Instructions (Signed)
Medication Instructions:  Please increase your losartan to 100 mg once daily  Labwork: None  Testing/Procedures: None  Follow-Up: 1 year

## 2015-05-28 ENCOUNTER — Ambulatory Visit (INDEPENDENT_AMBULATORY_CARE_PROVIDER_SITE_OTHER): Payer: BC Managed Care – PPO | Admitting: Internal Medicine

## 2015-05-28 ENCOUNTER — Encounter: Payer: Self-pay | Admitting: Internal Medicine

## 2015-05-28 VITALS — BP 138/80 | HR 68 | Temp 97.6°F | Ht 72.0 in | Wt 250.0 lb

## 2015-05-28 DIAGNOSIS — E785 Hyperlipidemia, unspecified: Secondary | ICD-10-CM

## 2015-05-28 DIAGNOSIS — I10 Essential (primary) hypertension: Secondary | ICD-10-CM

## 2015-05-28 DIAGNOSIS — H9191 Unspecified hearing loss, right ear: Secondary | ICD-10-CM | POA: Insufficient documentation

## 2015-05-28 DIAGNOSIS — Z0000 Encounter for general adult medical examination without abnormal findings: Secondary | ICD-10-CM

## 2015-05-28 DIAGNOSIS — I2510 Atherosclerotic heart disease of native coronary artery without angina pectoris: Secondary | ICD-10-CM | POA: Diagnosis not present

## 2015-05-28 LAB — COMPREHENSIVE METABOLIC PANEL
ALBUMIN: 4.4 g/dL (ref 3.5–5.2)
ALK PHOS: 67 U/L (ref 39–117)
ALT: 31 U/L (ref 0–53)
AST: 25 U/L (ref 0–37)
BUN: 17 mg/dL (ref 6–23)
CALCIUM: 9.6 mg/dL (ref 8.4–10.5)
CO2: 30 mEq/L (ref 19–32)
Chloride: 103 mEq/L (ref 96–112)
Creatinine, Ser: 1.08 mg/dL (ref 0.40–1.50)
GFR: 74.2 mL/min (ref >60.00)
GLUCOSE: 108 mg/dL — AB (ref 70–99)
Potassium: 4.3 mEq/L (ref 3.5–5.1)
Sodium: 137 mEq/L (ref 135–145)
TOTAL PROTEIN: 8 g/dL (ref 6.0–8.3)
Total Bilirubin: 1.2 mg/dL (ref 0.2–1.2)

## 2015-05-28 LAB — LIPID PANEL
Cholesterol: 118 mg/dL (ref 0–200)
HDL: 29.9 mg/dL — ABNORMAL LOW (ref >39.00)
LDL CALC: 74 mg/dL (ref 0–99)
NonHDL: 88.1
TRIGLYCERIDES: 69 mg/dL (ref 0.0–149.0)
Total CHOL/HDL Ratio: 4
VLDL: 13.8 mg/dL (ref 0.0–40.0)

## 2015-05-28 LAB — CBC WITH DIFFERENTIAL/PLATELET
BASOS ABS: 0 10*3/uL (ref 0.0–0.1)
Basophils Relative: 0.5 % (ref 0.0–3.0)
Eosinophils Absolute: 0 10*3/uL (ref 0.0–0.7)
Eosinophils Relative: 0.8 % (ref 0.0–5.0)
HCT: 47.8 % (ref 39.0–52.0)
Hemoglobin: 16 g/dL (ref 13.0–17.0)
LYMPHS ABS: 1.2 10*3/uL (ref 0.7–4.0)
Lymphocytes Relative: 20.4 % (ref 12.0–46.0)
MCHC: 33.4 g/dL (ref 30.0–36.0)
MCV: 85.1 fl (ref 78.0–100.0)
Monocytes Absolute: 1.1 10*3/uL — ABNORMAL HIGH (ref 0.1–1.0)
Monocytes Relative: 19.4 % — ABNORMAL HIGH (ref 3.0–12.0)
NEUTROS PCT: 58.9 % (ref 43.0–77.0)
Neutro Abs: 3.4 10*3/uL (ref 1.4–7.7)
PLATELETS: 190 10*3/uL (ref 150.0–400.0)
RBC: 5.62 Mil/uL (ref 4.22–5.81)
RDW: 14.2 % (ref 11.5–15.5)
WBC: 5.7 10*3/uL (ref 4.0–10.5)

## 2015-05-28 LAB — T4, FREE: FREE T4: 0.86 ng/dL (ref 0.60–1.60)

## 2015-05-28 MED ORDER — AMLODIPINE BESYLATE 10 MG PO TABS: 10.0000 mg | ORAL_TABLET | Freq: Every day | ORAL | Status: DC

## 2015-05-28 NOTE — Progress Notes (Signed)
Pre visit review using our clinic review tool, if applicable. No additional management support is needed unless otherwise documented below in the visit note. 

## 2015-05-28 NOTE — Assessment & Plan Note (Signed)
UTD on colon Will defer PSA to at least next year Discussed fitness

## 2015-05-28 NOTE — Assessment & Plan Note (Signed)
BP Readings from Last 3 Encounters:  05/28/15 138/80  04/05/15 152/92  05/26/14 120/80   Better on increased losartan If goes up, consider low dose beta blocker

## 2015-05-28 NOTE — Assessment & Plan Note (Addendum)
No symptoms No beta blocker per cardiology--HR on lower side

## 2015-05-28 NOTE — Progress Notes (Signed)
Subjective:    Patient ID: Matthew Doyle, male    DOB: 1956-04-25, 59 y.o.   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 aspirin 81 MG tablet Take 1 tablet (81 mg total) by mouth daily. 30 tablet   . atorvastatin (LIPITOR) 80 MG tablet TAKE ONE TABLET BY MOUTH ONCE DAILY AT  6PM 30 tablet 6  . COLCRYS 0.6 MG tablet TAKE ONE TABLET BY MOUTH TWICE DAILY AS NEEDED 60 tablet 0  . losartan (COZAAR) 100 MG tablet Take 1 tablet (100 mg total) by mouth daily. 90 tablet 3  . nitroGLYCERIN (NITROSTAT) 0.4 MG SL tablet Place 1 tablet (0.4 mg total) under the tongue every 5 (five) minutes as needed for chest pain. 25 tablet 3   No current facility-administered medications on file prior to visit.    Allergies  Allergen Reactions  . Lisinopril     REACTION: cough with this and/or benazepril    Past Medical History  Diagnosis Date  . Hypertension   . Hyperlipidemia   . Atypical chest pain   . Hemorrhoids   . Colon polyps   . Diverticulosis   . Gout   . Internal hemorrhoids without mention of complication   . CAD (coronary artery disease)     a. 09/2003 Cath/PCI: LAD 80%->3.0x23 Cypher DES;  b. 03/2004 Cath: LM nl, LAD 20p, 1m, patent stent, D1 25, D2 small, 80/jailed, LCX 73m, OM1 nl, OM2 20, RCA 40-50p, PDA 27m;  c. Non-ST elevation MI in January of 2014. Cardiac cath showed patent LAD stent, included distal right PDA  supplying a small territory, 99% proximal OM 2 status post PCI and DES placement   . Arthritis     knee  . MI (myocardial infarction)     last one- 12-16-12    Past Surgical History  Procedure Laterality Date  . Cypher stent  10/04    mid LAD- gupta  . Cardiac catheterization  2014    s/p stent  . Left heart catheterization with coronary angiogram N/A 12/17/2012    Procedure: LEFT HEART CATHETERIZATION WITH CORONARY ANGIOGRAM;  Surgeon: Peter M Martinique, MD;  Location: [facility] CATH LAB;  Service: Cardiovascular;  Laterality: N/A;  . Percutaneous coronary stent intervention (pci-s) Right 12/17/2012    Procedure: PERCUTANEOUS CORONARY STENT INTERVENTION (PCI-S);  Surgeon: Peter M Martinique, MD;  Location: [facility] C[facility] LAB;  Service: Cardiovascular;  Laterality: Right;    Family History  Problem Relation Age of Onset  . Diabetes Father   . Coronary artery disease Father     CABG - 5 vessel 15 yr ago  . Hypertension Brother   . Hypertension Brother   . Arrhythmia Brother   . Heart attack Paternal Uncle     Age 9  . Heart attack Maternal Uncle     Age 63  . Coronary artery disease Maternal Grandfather   .  Heart attack Maternal Grandfather     Died in 17s  . Throat cancer Maternal Grandfather   . Hypertension Mother   . Colon cancer Neg Hx   . Esophageal cancer Neg Hx   . Rectal cancer Neg Hx   . Stomach cancer Neg Hx     History   Social History  . Marital Status: Married    Spouse Name: N/A  . Number of Children: 2  . Years of Education: N/A   Occupational History  .     [employer] [place] History Main Topics  . Smoking status: Never Smoker   . Smokeless tobacco: Never Used  . Alcohol Use: No  . Drug Use: No  . Sexual Activity: Not on file   Other Topics Concern  . Not on file   Social History Narrative   Social research officer, government for [employer]  Constitutional: Negative for unexpected weight change.       Wears seat  belt  HENT: Positive for dental problem, hearing loss and tinnitus.        Needed tooth pulled this year. Only goes for problems  Eyes: Negative for visual disturbance.       No diplopia or unilateral vision loss  Respiratory: Positive for cough. Negative for chest tightness and shortness of breath.        Some cough with allergies  Cardiovascular: Negative for chest pain, palpitations and leg swelling.  Gastrointestinal: Positive for constipation. Negative for nausea, vomiting, abdominal pain and blood in stool.  Endocrine: Negative for polydipsia and polyuria.  Genitourinary: Negative for urgency, frequency and difficulty urinating.       No sexual problems  Musculoskeletal: Positive for arthralgias. Negative for back pain and joint swelling.       Just some knee pain and stiffness if sits for a long time  Skin: Negative for rash.       No suspicious lesions  Allergic/Immunologic: Positive for environmental allergies. Negative for immunocompromised state.       Mild only when pollen high  Neurological: Positive for weakness. Negative for dizziness, syncope, light-headedness, numbness and headaches.       Legs are weak  Hematological: Negative for adenopathy. Does not bruise/bleed easily.  Psychiatric/Behavioral: Negative for sleep disturbance and dysphoric mood. The patient is not nervous/anxious.        Objective:   Physical Exam  Constitutional: He is oriented to person, place, and time. He appears well-developed and well-nourished. No distress.  HENT:  Head: Normocephalic and atraumatic.  Right Ear: External ear normal.  Left Ear: External ear normal.  Mouth/Throat: Oropharynx is clear and moist. No oropharyngeal exudate.  TMs look normal  Eyes: Conjunctivae and EOM are normal. Pupils are equal, round, and reactive to light.  Neck: Normal range of motion. Neck supple. No thyromegaly present.  Cardiovascular: Normal rate, regular rhythm, normal heart sounds and intact distal  pulses.  Exam reveals no gallop.   No murmur heard. Pulmonary/Chest: Effort normal and breath sounds normal. No respiratory distress. He has no wheezes. He has no rales.  Abdominal: Soft. There is no tenderness.  Musculoskeletal: He exhibits no edema or tenderness.  Lymphadenopathy:    He has no cervical adenopathy.  Neurological: He is alert and oriented to person, place, and time.  Skin: No rash noted. No erythema.  Psychiatric: He has a normal mood and affect. His behavior is normal.  Assessment & Plan:

## 2015-05-28 NOTE — Assessment & Plan Note (Signed)
No problems with statin 

## 2015-05-28 NOTE — Assessment & Plan Note (Signed)
Goes back at least a year but suddenly worse Will set up with ENT

## 2015-06-26 ENCOUNTER — Encounter: Payer: Self-pay | Admitting: Family Medicine

## 2015-06-26 ENCOUNTER — Ambulatory Visit (INDEPENDENT_AMBULATORY_CARE_PROVIDER_SITE_OTHER): Payer: BC Managed Care – PPO | Admitting: Family Medicine

## 2015-06-26 ENCOUNTER — Ambulatory Visit (INDEPENDENT_AMBULATORY_CARE_PROVIDER_SITE_OTHER)
Admission: RE | Admit: 2015-06-26 | Discharge: 2015-06-26 | Disposition: A | Discharge status: Home / Self Care | Payer: BC Managed Care – PPO | Source: Ambulatory Visit | Attending: Family Medicine | Admitting: Family Medicine

## 2015-06-26 VITALS — BP 134/82 | HR 66 | Temp 98.3°F | Ht 72.0 in | Wt 249.0 lb

## 2015-06-26 DIAGNOSIS — R1011 Right upper quadrant pain: Secondary | ICD-10-CM | POA: Insufficient documentation

## 2015-06-26 DIAGNOSIS — R1032 Left lower quadrant pain: Secondary | ICD-10-CM

## 2015-06-26 DIAGNOSIS — K5900 Constipation, unspecified: Secondary | ICD-10-CM | POA: Insufficient documentation

## 2015-06-26 DIAGNOSIS — K589 Irritable bowel syndrome without diarrhea: Secondary | ICD-10-CM | POA: Insufficient documentation

## 2015-06-26 LAB — BASIC METABOLIC PANEL
BUN: 23 mg/dL (ref 6–23)
CALCIUM: 9.7 mg/dL (ref 8.4–10.5)
CO2: 30 mEq/L (ref 19–32)
CREATININE: 1.66 mg/dL — AB (ref 0.40–1.50)
Chloride: 103 mEq/L (ref 96–112)
GFR: 45.17 mL/min — AB (ref >60.00)
GLUCOSE: 117 mg/dL — AB (ref 70–99)
POTASSIUM: 4.1 meq/L (ref 3.5–5.1)
Sodium: 142 mEq/L (ref 135–145)

## 2015-06-26 LAB — CBC WITH DIFFERENTIAL/PLATELET
BASOS ABS: 0.1 10*3/uL (ref 0.0–0.1)
Basophils Relative: 0.4 % (ref 0.0–3.0)
EOS PCT: 1.6 % (ref 0.0–5.0)
Eosinophils Absolute: 0.2 10*3/uL (ref 0.0–0.7)
HEMATOCRIT: 46.3 % (ref 39.0–52.0)
Hemoglobin: 15.5 g/dL (ref 13.0–17.0)
Lymphocytes Relative: 26.6 % (ref 12.0–46.0)
Lymphs Abs: 3.4 10*3/uL (ref 0.7–4.0)
MCHC: 33.5 g/dL (ref 30.0–36.0)
MCV: 85.4 fl (ref 78.0–100.0)
MONOS PCT: 10.3 % (ref 3.0–12.0)
Monocytes Absolute: 1.3 10*3/uL — ABNORMAL HIGH (ref 0.1–1.0)
Neutro Abs: 7.8 10*3/uL — ABNORMAL HIGH (ref 1.4–7.7)
Neutrophils Relative %: 61.1 % (ref 43.0–77.0)
Platelets: 221 10*3/uL (ref 150.0–400.0)
RBC: 5.42 Mil/uL (ref 4.22–5.81)
RDW: 14.3 % (ref 11.5–15.5)
WBC: 12.8 10*3/uL — AB (ref 4.0–10.5)

## 2015-06-26 MED ORDER — CIPROFLOXACIN HCL 500 MG PO TABS: 500.0000 mg | ORAL_TABLET | Freq: Two times a day (BID) | ORAL | Status: DC

## 2015-06-26 MED ORDER — METRONIDAZOLE 500 MG PO TABS: 500.0000 mg | ORAL_TABLET | Freq: Three times a day (TID) | ORAL | Status: DC

## 2015-06-26 NOTE — Progress Notes (Signed)
Pre visit review using our clinic review tool, if applicable. No additional management support is needed unless otherwise documented below in the visit note. 

## 2015-06-26 NOTE — Progress Notes (Signed)
Subjective:    Patient ID: Matthew Doyle, male    DOB: 1955-12-14, 59 y.o.   MRN: [id number]  HPI Here for GI issues   Has pain in lower abdomen and into his back (2nd time in 2 weeks)  Worse at night  100.5 temp last night also  Has also been constipated -last few days - can pass just a little stool and it is hard   Yesterday - he took 2 otc laxative pills - it softened up his stools but not much volume  Has been constipated ever since he has been on all of his medicine (for years)  However much worse for the past month   No nausea or vomiting   Last colonoscopy was in 2014  Did find some diverticulosis   In general does not drink much water, he does drink diet sodas /soft drinks   Diet is fair - eats a lot of vegetables (not fruit) , and eats some wheat bread  No cereal  Likes milk , not a lot of cheese - some ice cream   Patient Active Problem List   Diagnosis Date Noted  . 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/28/2015  . Personal history of colonic polyps 08/30/2013  . CAD (coronary artery disease)   . NSTEMI (non-ST elevated myocardial infarction) 12/17/2012  . Routine general medical examination at a health care facility 05/13/2012  . GOUT 12/25/2010  . POLYP, COLON 11/01/2007  . DIVERTICULOSIS, COLON 11/01/2007  . Hyperlipidemia 09/17/2007  . Essential hypertension 09/17/2007   Past Medical History  Diagnosis Date  . Hypertension   . Hyperlipidemia   . Atypical chest pain   . Hemorrhoids   . Colon polyps   . Diverticulosis   . Gout   . Internal hemorrhoids without mention of complication   . CAD (coronary artery disease)     a. 09/2003 Cath/PCI: LAD 80%->3.0x23 Cypher DES;  b. 03/2004 Cath: LM nl, LAD 20p, 74m, patent stent, D1 25, D2 small, 80/jailed, LCX 87m, OM1 nl, OM2 20, RCA 40-50p, PDA 63m;  c. Non-ST elevation MI in January of 2014. Cardiac cath showed patent LAD stent, included distal right PDA supplying a small territory, 99% proximal OM 2 status post  PCI and DES placement   . Arthritis     knee  . MI (myocardial infarction)     last one- 12-16-12   Past Surgical History  Procedure Laterality Date  . Cypher stent  10/04    mid LAD- gupta  . Cardiac catheterization  2014    s/p stent  . Left heart catheterization with coronary angiogram N/A 12/17/2012    Procedure: LEFT HEART CATHETERIZATION WITH CORONARY ANGIOGRAM;  Surgeon: Peter M Martinique, MD;  Location: [facility] CATH LAB;  Service: Cardiovascular;  Laterality: N/A;  . Percutaneous coronary stent intervention (pci-s) Right 12/17/2012    Procedure: PERCUTANEOUS CORONARY STENT INTERVENTION (PCI-S);  Surgeon: Peter M Martinique, MD;  Location: [facility] CATH LAB;  Service: Cardiovascular;  Laterality: Right;   History  Substance Use Topics  . Smoking status: Never Smoker   . Smokeless tobacco: Never Used  . Alcohol Use: No   Family History  Problem Relation Age of Onset  . Diabetes Father   . Coronary artery disease Father     CABG - 5 vessel 15 yr ago  . Hypertension Brother   . Hypertension Brother   . Arrhythmia Brother   . Heart attack Paternal Uncle     Age 62  . Heart  attack Maternal Uncle     Age 47  . Coronary artery disease Maternal Grandfather   . Heart attack Maternal Grandfather     Died in 6s  . Throat cancer Maternal Grandfather   . Hypertension Mother   . Colon cancer Neg Hx   . Esophageal cancer Neg Hx   . Rectal cancer Neg Hx   . Stomach cancer Neg Hx    Allergies  Allergen Reactions  . Lisinopril     REACTION: cough with this and/or benazepril   Current Outpatient Prescriptions on File Prior to Visit  Medication Sig Dispense Refill  . amLODipine (NORVASC) 10 MG tablet Take 1 tablet (10 mg total) by mouth daily. 90 tablet 3  . aspirin 81 MG tablet Take 1 tablet (81 mg total) by mouth daily. 30 tablet   . atorvastatin (LIPITOR) 80 MG tablet TAKE ONE TABLET BY MOUTH ONCE DAILY AT  6PM 30 tablet 6  . COLCRYS 0.6 MG tablet TAKE ONE TABLET BY MOUTH TWICE DAILY AS  NEEDED 60 tablet 0  . losartan (COZAAR) 100 MG tablet Take 1 tablet (100 mg total) by mouth daily. 90 tablet 3  . nitroGLYCERIN (NITROSTAT) 0.4 MG SL tablet Place 1 tablet (0.4 mg total) under the tongue every 5 (five) minutes as needed for chest pain. 25 tablet 3   No current facility-administered medications on file prior to visit.    Review of Systems Review of Systems  Constitutional: Negative for fever, appetite change, fatigue and unexpected weight change.  Eyes: Negative for pain and visual disturbance.  Respiratory: Negative for cough and shortness of breath.   Cardiovascular: Negative for cp or palpitations    Gastrointestinal: Negative for nausea, diarrhea and blood in stool/dark stool  Genitourinary: Negative for urgency and frequency.  Skin: Negative for pallor or rash   Neurological: Negative for weakness, light-headedness, numbness and headaches.  Hematological: Negative for adenopathy. Does not bruise/bleed easily.  Psychiatric/Behavioral: Negative for dysphoric mood. The patient is not nervous/anxious.         Objective:   Physical Exam  Constitutional: He appears well-developed and well-nourished. No distress.  overwt and well appearing   HENT:  Head: Normocephalic and atraumatic.  Mouth/Throat: Oropharynx is clear and moist.  Eyes: Conjunctivae and EOM are normal. Pupils are equal, round, and reactive to light. No scleral icterus.  Neck: Normal range of motion. Neck supple.  Cardiovascular: Normal rate, regular rhythm and normal heart sounds.   Pulmonary/Chest: Effort normal and breath sounds normal. No respiratory distress. He has no wheezes. He has no rales.  Abdominal: Soft. Bowel sounds are normal. He exhibits no distension and no mass. There is no hepatosplenomegaly. There is tenderness in the right lower quadrant and left lower quadrant. There is no rigidity, no rebound, no guarding, no CVA tenderness, no tenderness at McBurney's point and negative Murphy's  sign.  Musculoskeletal: He exhibits no edema.  Lymphadenopathy:    He has no cervical adenopathy.  Neurological: He is alert.  Skin: Skin is warm and dry. No rash noted. No erythema. No pallor.  Psychiatric: His affect is blunt.  Blunted affect -unsure if very stoic or shy          Assessment & Plan:   Problem List Items Addressed This Visit    Abdominal pain, left lower quadrant - Primary    In a pt with hx of mild diverticulosis on colonosc in 2014 and also constipation  (also one night of slt elevated temp)  Diff incl  constipation/ diverticulitis or other  abd xr now  Cbc and bmp now  Wrote px for cipro and flagyl to hold for now (emp cov of diverticulitis)  Will begin bid miralax also   Update and plan with results       Relevant Orders   CBC with Differential/Platelet (Completed)   Basic metabolic panel (Completed)   DG Abd 2 Views (Completed)   Constipation   Relevant Orders   DG Abd 2 Views (Completed)

## 2015-06-26 NOTE — Patient Instructions (Signed)
Xray of abdomen now  Labs now  On the way out ask the staff to de activate my chart -if you are not going to use it  I will contact you with results when they return  Hold the px for cipro and flagyl - these are 2 antibiotics we use for diverticulitis - I may have you fill them depending on results  Drink water  Get miralax - take it twice daily as directed until bowels start moving better    Call and let us know if abdominal pain worsens or if fever or other new symptoms

## 2015-06-26 NOTE — Assessment & Plan Note (Signed)
In a pt with hx of mild diverticulosis on colonosc in 2014 and also constipation  (also one night of slt elevated temp)  Diff incl constipation/ diverticulitis or other  abd xr now  Cbc and bmp now  Wrote px for cipro and flagyl to hold for now (emp cov of diverticulitis)  Will begin bid miralax also   Update and plan with results

## 2015-07-06 ENCOUNTER — Ambulatory Visit (INDEPENDENT_AMBULATORY_CARE_PROVIDER_SITE_OTHER): Payer: BC Managed Care – PPO | Admitting: Internal Medicine

## 2015-07-06 ENCOUNTER — Encounter: Payer: Self-pay | Admitting: Internal Medicine

## 2015-07-06 VITALS — BP 120/80 | HR 66 | Temp 98.0°F | Wt 252.0 lb

## 2015-07-06 DIAGNOSIS — R1013 Epigastric pain: Secondary | ICD-10-CM | POA: Diagnosis not present

## 2015-07-06 NOTE — Assessment & Plan Note (Signed)
No signs of diverticulitis now This is likely still related to poor colonic transit Stools seem soft now--will add stimulant

## 2015-07-06 NOTE — Progress Notes (Signed)
Subjective:    Patient ID: Matthew Doyle, male    DOB: December 08, 1955, 59 y.o.   MRN: [id number]  HPI Follow up of abdominal pain  Feels some better but still has some abdominal pain Feels full a lot of the time--like he ate too much Previous pain was in both lower quadrants--now more epigastric  Did take the antibiotics Done with these No recent fever No chills or sweats  Appetite not good still Bowels are moving --goes often but small quantities Using miralax 1 capful daily or bid  Current Outpatient Prescriptions on File Prior to Visit  Medication Sig Dispense Refill  . amLODipine (NORVASC) 10 MG tablet Take 1 tablet (10 mg total) by mouth daily. 90 tablet 3  . aspirin 81 MG tablet Take 1 tablet (81 mg total) by mouth daily. 30 tablet   . atorvastatin (LIPITOR) 80 MG tablet TAKE ONE TABLET BY MOUTH ONCE DAILY AT  6PM 30 tablet 6  . COLCRYS 0.6 MG tablet TAKE ONE TABLET BY MOUTH TWICE DAILY AS NEEDED 60 tablet 0  . losartan (COZAAR) 100 MG tablet Take 1 tablet (100 mg total) by mouth daily. 90 tablet 3  . nitroGLYCERIN (NITROSTAT) 0.4 MG SL tablet Place 1 tablet (0.4 mg total) under the tongue every 5 (five) minutes as needed for chest pain. 25 tablet 3   No current facility-administered medications on file prior to visit.    Allergies  Allergen Reactions  . Lisinopril     REACTION: cough with this and/or benazepril    Past Medical History  Diagnosis Date  . Hypertension   . Hyperlipidemia   . Atypical chest pain   . Hemorrhoids   . Colon polyps   . Diverticulosis   . Gout   . Internal hemorrhoids without mention of complication   . CAD (coronary artery disease)     a. 09/2003 Cath/PCI: LAD 80%->3.0x23 Cypher DES;  b. 03/2004 Cath: LM nl, LAD 20p, 42m, patent stent, D1 25, D2 small, 80/jailed, LCX 66m, OM1 nl, OM2 20, RCA 40-50p, PDA 100m;  c. Non-ST elevation MI in January of 2014. Cardiac cath showed patent LAD stent, included distal right PDA supplying a small  territory, 99% proximal OM 2 status post PCI and DES placement   . Arthritis     knee  . MI (myocardial infarction)     last one- 12-16-12    Past Surgical History  Procedure Laterality Date  . Cypher stent  10/04    mid LAD- gupta  . Cardiac catheterization  2014    s/p stent  . Left heart catheterization with coronary angiogram N/A 12/17/2012    Procedure: LEFT HEART CATHETERIZATION WITH CORONARY ANGIOGRAM;  Surgeon: Peter M Martinique, MD;  Location: [facility] CATH LAB;  Service: Cardiovascular;  Laterality: N/A;  . Percutaneous coronary stent intervention (pci-s) Right 12/17/2012    Procedure: PERCUTANEOUS CORONARY STENT INTERVENTION (PCI-S);  Surgeon: Peter M Martinique, MD;  Location: [facility] CATH LAB;  Service: Cardiovascular;  Laterality: Right;    Family History  Problem Relation Age of Onset  . Diabetes Father   . Coronary artery disease Father     CABG - 5 vessel 15 yr ago  . Hypertension Brother   . Hypertension Brother   . Arrhythmia Brother   . Heart attack Paternal Uncle     Age 49  . Heart attack Maternal Uncle     Age 62  . Coronary artery disease Maternal Grandfather   . Heart attack Maternal Grandfather  Died in 51s  . Throat cancer Maternal Grandfather   . Hypertension Mother   . Colon cancer Neg Hx   . Esophageal cancer Neg Hx   . Rectal cancer Neg Hx   . Stomach cancer Neg Hx     History   Social History  . Marital Status: Married    Spouse Name: N/A  . Number of Children: 2  . Years of Education: N/A   Occupational History  .     [employer] [place] History Main Topics  . Smoking status: Never Smoker   . Smokeless tobacco: Never Used  . Alcohol Use: No  . Drug Use: No  . Sexual Activity: Not on file   Other Topics Concern  . Not on file   Social History Narrative   Retired as Social research officer, government for [employer]         [place] 3# No urinary problems    Objective:   Physical Exam    Constitutional: He appears well-developed and well-nourished. No distress.  Pulmonary/Chest: Effort normal and breath sounds normal. No respiratory distress. He has no wheezes. He has no rales.  Abdominal: Soft. Bowel sounds are normal. He exhibits no distension and no mass. There is no rebound and no guarding.  Mild epigastric tenderness          Assessment & Plan:

## 2015-07-06 NOTE — Patient Instructions (Signed)
Please continue the miralax once or twice a day. Add senna-s 2 tabs twice a day for now. If your bowels are not emptying in the next 2-3 days, you can try oral milk of magnesia or dulcolax (or a suppository)

## 2015-07-06 NOTE — Progress Notes (Signed)
Pre visit review using our clinic review tool, if applicable. No additional management support is needed unless otherwise documented below in the visit note. 

## 2015-10-26 ENCOUNTER — Other Ambulatory Visit: Payer: Self-pay | Admitting: Cardiovascular Disease

## 2015-11-04 ENCOUNTER — Encounter: Payer: Self-pay | Admitting: Emergency Medicine

## 2015-11-04 ENCOUNTER — Emergency Department: Payer: BC Managed Care – PPO

## 2015-11-04 ENCOUNTER — Emergency Department
Admission: EM | Admit: 2015-11-04 | Discharge: 2015-11-04 | Disposition: A | Discharge status: Home / Self Care | Payer: BC Managed Care – PPO | Attending: Student | Admitting: Student

## 2015-11-04 DIAGNOSIS — K8500 Idiopathic acute pancreatitis without necrosis or infection: Secondary | ICD-10-CM | POA: Diagnosis not present

## 2015-11-04 DIAGNOSIS — I10 Essential (primary) hypertension: Secondary | ICD-10-CM | POA: Insufficient documentation

## 2015-11-04 DIAGNOSIS — K529 Noninfective gastroenteritis and colitis, unspecified: Secondary | ICD-10-CM | POA: Diagnosis not present

## 2015-11-04 DIAGNOSIS — R1011 Right upper quadrant pain: Secondary | ICD-10-CM | POA: Diagnosis present

## 2015-11-04 DIAGNOSIS — R52 Pain, unspecified: Secondary | ICD-10-CM

## 2015-11-04 DIAGNOSIS — Z79899 Other long term (current) drug therapy: Secondary | ICD-10-CM | POA: Insufficient documentation

## 2015-11-04 DIAGNOSIS — Z7982 Long term (current) use of aspirin: Secondary | ICD-10-CM | POA: Diagnosis not present

## 2015-11-04 LAB — URINALYSIS COMPLETE WITH MICROSCOPIC (ARMC ONLY)
BILIRUBIN URINE: NEGATIVE
Bacteria, UA: NONE SEEN
GLUCOSE, UA: NEGATIVE mg/dL
Hgb urine dipstick: NEGATIVE
KETONES UR: NEGATIVE mg/dL
Leukocytes, UA: NEGATIVE
Nitrite: NEGATIVE
Protein, ur: NEGATIVE mg/dL
Specific Gravity, Urine: 1.017 (ref 1.005–1.030)
pH: 6 (ref 5.0–8.0)

## 2015-11-04 LAB — CBC
HEMATOCRIT: 45.4 % (ref 40.0–52.0)
Hemoglobin: 14.9 g/dL (ref 13.0–18.0)
MCH: 28 pg (ref 26.0–34.0)
MCHC: 32.8 g/dL (ref 32.0–36.0)
MCV: 85.2 fL (ref 80.0–100.0)
PLATELETS: 212 10*3/uL (ref 150–440)
RBC: 5.33 MIL/uL (ref 4.40–5.90)
RDW: 13.6 % (ref 11.5–14.5)
WBC: 12.1 10*3/uL — ABNORMAL HIGH (ref 3.8–10.6)

## 2015-11-04 LAB — BASIC METABOLIC PANEL
Anion gap: 6 (ref 5–15)
BUN: 20 mg/dL (ref 6–20)
CO2: 25 mmol/L (ref 22–32)
CREATININE: 0.89 mg/dL (ref 0.61–1.24)
Calcium: 9.5 mg/dL (ref 8.9–10.3)
Chloride: 108 mmol/L (ref 101–111)
GFR calc Af Amer: >60 mL/min (ref >60)
GLUCOSE: 173 mg/dL — AB (ref 65–99)
POTASSIUM: 3.8 mmol/L (ref 3.5–5.1)
SODIUM: 139 mmol/L (ref 135–145)

## 2015-11-04 LAB — TROPONIN I
Troponin I: <0.03 ng/mL (ref <0.031)
Troponin I: <0.03 ng/mL (ref <0.031)

## 2015-11-04 LAB — HEPATIC FUNCTION PANEL
ALK PHOS: 59 U/L (ref 38–126)
ALT: 21 U/L (ref 17–63)
AST: 23 U/L (ref 15–41)
Albumin: 4.1 g/dL (ref 3.5–5.0)
BILIRUBIN TOTAL: 0.7 mg/dL (ref 0.3–1.2)
Bilirubin, Direct: <0.1 mg/dL — ABNORMAL LOW (ref 0.1–0.5)
TOTAL PROTEIN: 7.8 g/dL (ref 6.5–8.1)

## 2015-11-04 LAB — LIPASE, BLOOD: LIPASE: 76 U/L — AB (ref 11–51)

## 2015-11-04 MED ORDER — AMOXICILLIN-POT CLAVULANATE 875-125 MG PO TABS: 1.0000 | ORAL_TABLET | Freq: Two times a day (BID) | ORAL | Status: DC

## 2015-11-04 MED ORDER — IOHEXOL 240 MG/ML SOLN
25.0000 mL | Freq: Once | INTRAMUSCULAR | Status: AC | PRN
  Administered 2015-11-04: 25 mL via ORAL

## 2015-11-04 MED ORDER — ONDANSETRON 4 MG PO TBDP: 4.0000 mg | ORAL_TABLET | Freq: Three times a day (TID) | ORAL | Status: DC | PRN

## 2015-11-04 MED ORDER — SODIUM CHLORIDE 0.9 % IV BOLUS (SEPSIS)
500.0000 mL | Freq: Once | INTRAVENOUS | Status: AC
  Administered 2015-11-04: 500 mL via INTRAVENOUS

## 2015-11-04 MED ORDER — METRONIDAZOLE 500 MG PO TABS: 500.0000 mg | ORAL_TABLET | Freq: Two times a day (BID) | ORAL | Status: DC

## 2015-11-04 MED ORDER — ONDANSETRON HCL 4 MG/2ML IJ SOLN
4.0000 mg | Freq: Once | INTRAMUSCULAR | Status: AC
  Administered 2015-11-04: 4 mg via INTRAVENOUS
  Filled 2015-11-04: qty 2

## 2015-11-04 MED ORDER — IOHEXOL 300 MG/ML  SOLN
125.0000 mL | Freq: Once | INTRAMUSCULAR | Status: AC | PRN
  Administered 2015-11-04: 125 mL via INTRAVENOUS

## 2015-11-04 MED ORDER — OXYCODONE HCL 5 MG PO TABS: 5.0000 mg | ORAL_TABLET | Freq: Four times a day (QID) | ORAL | Status: DC | PRN

## 2015-11-04 MED ORDER — MORPHINE SULFATE (PF) 4 MG/ML IV SOLN
4.0000 mg | Freq: Once | INTRAVENOUS | Status: AC
  Administered 2015-11-04: 4 mg via INTRAVENOUS
  Filled 2015-11-04: qty 1

## 2015-11-04 NOTE — ED Notes (Signed)
NAD noted at this time. Pt VSS and WNL. Pt ambulatory with no difficulty at time of D/C. Pt denies pain. Pt given opportunity for questions and concerns, denies any at this time.

## 2015-11-04 NOTE — ED Provider Notes (Addendum)
[facility] Emergency Department Provider Note  ____________________________________________  Time seen: Approximately 2:22 AM  I have reviewed the triage vital signs and the nursing notes.   HISTORY  Chief Complaint Chest Pain    HPI BUREL Matthew Doyle is a 59 y.o. male with coronary artery disease status post stents, hypertension, hyperlipidemia since for evaluation of sudden onset epigastric and right upper quadrant pain which began at 8 PM last night, constant since onset, no modifying factors, currently severe. Patient reports that he ate pizza at approximately 7 PM. At around 8 PM he developed severe pain in the right upper quadrant and the epigastrium which feels different from his prior heart attacks and is tender to the touch. Pain is not radiating, is not exertional, associated with some nausea but no shortness of breath. No fevers or chills. Prior to tonight, he had been in his usual state of health.   Past Medical History  Diagnosis Date  . Hypertension   . Hyperlipidemia   . Atypical chest pain   . Hemorrhoids   . Colon polyps   . Diverticulosis   . Gout   . Internal hemorrhoids without mention of complication   . CAD (coronary artery disease)     a. 09/2003 Cath/PCI: LAD 80%->3.0x23 Cypher DES;  b. 03/2004 Cath: LM nl, LAD 20p, 74m, patent stent, D1 25, D2 small, 80/jailed, LCX 84m, OM1 nl, OM2 20, RCA 40-50p, PDA 56m;  c. Non-ST elevation MI in January of 2014. Cardiac cath showed patent LAD stent, included distal right PDA supplying a small territory, 99% proximal OM 2 status post PCI and DES placement   . Arthritis     knee  . MI (myocardial infarction) ([place])     last one- 12-16-12    Patient Active Problem List   Diagnosis Date Noted  . Abdominal pain, epigastric 06/26/2015  . Constipation 06/26/2015  . 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/28/2015  . Personal history of colonic polyps 08/30/2013  . CAD (coronary artery disease)   . NSTEMI  (non-ST elevated myocardial infarction) ([place]) 12/17/2012  . Routine general medical examination at a health care facility 05/13/2012  . GOUT 12/25/2010  . POLYP, COLON 11/01/2007  . DIVERTICULOSIS, COLON 11/01/2007  . Hyperlipidemia 09/17/2007  . Essential hypertension 09/17/2007    Past Surgical History  Procedure Laterality Date  . Cypher stent  10/04    mid LAD- gupta  . Cardiac catheterization  2014    s/p stent  . Left heart catheterization with coronary angiogram N/A 12/17/2012    Procedure: LEFT HEART CATHETERIZATION WITH CORONARY ANGIOGRAM;  Surgeon: Peter M Martinique, MD;  Location: [facility] CATH LAB;  Service: Cardiovascular;  Laterality: N/A;  . Percutaneous coronary stent intervention (pci-s) Right 12/17/2012    Procedure: PERCUTANEOUS CORONARY STENT INTERVENTION (PCI-S);  Surgeon: Peter M Martinique, MD;  Location: [facility] CATH LAB;  Service: Cardiovascular;  Laterality: Right;    Current Outpatient Rx  Name  Route  Sig  Dispense  Refill  . amLODipine (NORVASC) 10 MG tablet   Oral   Take 1 tablet (10 mg total) by mouth daily.   90 tablet   3   . aspirin 81 MG tablet   Oral   Take 1 tablet (81 mg total) by mouth daily.   30 tablet      . atorvastatin (LIPITOR) 80 MG tablet      TAKE ONE TABLET BY MOUTH ONCE DAILY 6PM   30 tablet   5   .  COLCRYS 0.6 MG tablet      TAKE ONE TABLET BY MOUTH TWICE DAILY AS NEEDED   60 tablet   0   . losartan (COZAAR) 100 MG tablet   Oral   Take 1 tablet (100 mg total) by mouth daily.   90 tablet   3   . amoxicillin-clavulanate (AUGMENTIN) 875-125 MG tablet   Oral   Take 1 tablet by mouth 2 (two) times daily.   14 tablet   0   . metroNIDAZOLE (FLAGYL) 500 MG tablet   Oral   Take 1 tablet (500 mg total) by mouth 2 (two) times daily.   14 tablet   0   . nitroGLYCERIN (NITROSTAT) 0.4 MG SL tablet   Sublingual   Place 1 tablet (0.4 mg total) under the tongue every 5 (five) minutes as needed for chest pain.   25 tablet   3   .  ondansetron (ZOFRAN ODT) 4 MG disintegrating tablet   Oral   Take 1 tablet (4 mg total) by mouth every 8 (eight) hours as needed for nausea or vomiting.   12 tablet   0   . oxyCODONE (ROXICODONE) 5 MG immediate release tablet   Oral   Take 1 tablet (5 mg total) by mouth every 6 (six) hours as needed for moderate pain. Do not drive while taking this medication.   15 tablet   0     Allergies Lisinopril  Family History  Problem Relation Age of Onset  . Diabetes Father   . Coronary artery disease Father     CABG - 5 vessel 15 yr ago  . Hypertension Brother   . Hypertension Brother   . Arrhythmia Brother   . Heart attack Paternal Uncle     Age 60  . Heart attack Maternal Uncle     Age 63  . Coronary artery disease Maternal Grandfather   . Heart attack Maternal Grandfather     Died in 37s  . Throat cancer Maternal Grandfather   . Hypertension Mother   . Colon cancer Neg Hx   . Esophageal cancer Neg Hx   . Rectal cancer Neg Hx   . Stomach cancer Neg Hx     Social History Social History  Substance Use Topics  . Smoking status: Never Smoker   . Smokeless tobacco: Never Used  . Alcohol Use: No    Review of Systems Constitutional: No fever/chills Eyes: No visual changes. ENT: No sore throat. Cardiovascular: Denies chest pain. Respiratory: Denies shortness of breath. Gastrointestinal: + abdominal pain.  +nausea, no vomiting.  No diarrhea.  No constipation. Genitourinary: Negative for dysuria. Musculoskeletal: Negative for back pain. Skin: Negative for rash. Neurological: Negative for headaches, focal weakness or numbness.  10-point ROS otherwise negative.  ____________________________________________   PHYSICAL EXAM:  VITAL SIGNS: ED Triage Vitals  Enc Vitals Group     BP 11/04/15 0214 155/86 mmHg     Pulse Rate 11/04/15 0214 65     Resp 11/04/15 0214 22     Temp 11/04/15 0214 97.8 F (36.6 C)     Temp Source 11/04/15 0214 Oral     SpO2 11/04/15 0214 98  %     Weight 11/04/15 0214 245 lb (111.131 kg)     Height 11/04/15 0214 6' (1.829 m)     Head Cir --      Peak Flow --      Pain Score 11/04/15 0214 8     Pain Loc --  Pain Edu? --      Excl. in [place]? --     Constitutional: Alert and oriented. In mild distress secondary to pain. Eyes: Conjunctivae are normal. PERRL. EOMI. Head: Atraumatic. Nose: No congestion/rhinnorhea. Mouth/Throat: Mucous membranes are moist.  Oropharynx non-erythematous. Neck: No stridor.   Cardiovascular: Normal rate, regular rhythm. Grossly normal heart sounds.  Good peripheral circulation. Respiratory: Normal respiratory effort.  No retractions. Lungs CTAB. Gastrointestinal: Soft with moderate to severe tenderness in the epigastrium and the right upper quadrant. No CVA tenderness. Genitourinary: Deferred Musculoskeletal: No lower extremity tenderness nor edema.  No joint effusions. Neurologic:  Normal speech and language. No gross focal neurologic deficits are appreciated.  Skin:  Skin is warm, dry and intact. No rash noted. Psychiatric: Mood and affect are normal. Speech and behavior are normal.  ____________________________________________   LABS (all labs ordered are listed, but only abnormal results are displayed)  Labs Reviewed  BASIC METABOLIC PANEL - Abnormal; Notable for the following:    Glucose, Bld 173 (*)    All other components within normal limits  CBC - Abnormal; Notable for the following:    WBC 12.1 (*)    All other components within normal limits  HEPATIC FUNCTION PANEL - Abnormal; Notable for the following:    Bilirubin, Direct <0.1 (*)    All other components within normal limits  LIPASE, BLOOD - Abnormal; Notable for the following:    Lipase 76 (*)    All other components within normal limits  URINALYSIS COMPLETEWITH MICROSCOPIC ([facility] ONLY) - Abnormal; Notable for the following:    Color, Urine YELLOW (*)    APPearance CLEAR (*)    Squamous Epithelial / LPF 0-5 (*)    All  other components within normal limits  TROPONIN I  TROPONIN I   ____________________________________________  EKG  ED ECG REPORT I, Joanne Gavel, the attending physician, personally viewed and interpreted this ECG.   Date: 11/04/2015  EKG Time: 02:16  Rate: 65  Rhythm: normal sinus rhythm  Axis: normal  Intervals: LVH with QRS widening.  ST&T Change: No acute ST elevation.  ____________________________________________  RADIOLOGY  CXR  IMPRESSION: No active cardiopulmonary disease.   RUQ ultrasound IMPRESSION: Normal liver, gallbladder and bile ducts.   CT abdomen and pelvis IMPRESSION: 1. Fluid distention of the colon. Mild colonic mural enhancement suggests colitis. 2. Incidental findings include bilateral fat containing inguinal hernias and a fat containing umbilical hernia. ____________________________________________   PROCEDURES  Procedure(s) performed: None  Critical Care performed: No  ____________________________________________   INITIAL IMPRESSION / ASSESSMENT AND PLAN / ED COURSE  Pertinent labs & imaging results that were available during my care of the patient were reviewed by me and considered in my medical decision making (see chart for details).  Matthew Doyle is a 59 y.o. male with coronary artery disease status post stents, hypertension, hyperlipidemia since for evaluation of sudden onset epigastric and right upper quadrant pain which began at 8 PM last night. On exam, he is in mild distress secondary to pain. Vital signs are stable, he is afebrile. He does have tenderness to palpation in the epigastrium and the right upper quadrant suspicious for acute gallbladder pathology. Labs reviewed. CBC notable for mild leukocytosis. CMP unremarkable. Troponin negative and given that it was drawn 6 hours after onset of pain and pain has been constant since onset, I doubt ACS. Clinical picture not consistent with PE or acute aortic dissection.  Lipase is mildly elevated at 76 and this could  represent pancreatitis. Chest x-ray shows no acute cardio pulmonary process. Right upper quadrant ultrasound pending. We'll treat his pain. Reassess for disposition.  ----------------------------------------- 5:31 AM on 11/04/2015 ----------------------------------------- Right upper quadrant ultrasound shows normal gallbladder. This could represent pancreatitis however patient denies any provoking factors which would cause pancreatitis. Given the nature of his pain and mild leukocytosis, we'll obtain CT of the abdomen and pelvis.  ----------------------------------------- 7:01 AM on 11/04/2015 -----------------------------------------  Second troponin negative. Doubt ACS, acute aortic dissection or PE. CT of the abdomen and pelvis suggest mild colitis. This may represent infectious colitis. Doubt ischemic colitis given normal bicarbonate, complete resolution of pain and general well- appearance at this time. The patient is sitting up in bed, watching TV in no distress. We'll discharge with Flagyl and Augmentin. I discussed extensive return precautions with the patient as well as need for close PCP follow-up and he is comfortable with the discharge plan.   ____________________________________________   FINAL CLINICAL IMPRESSION(S) / ED DIAGNOSES  Final diagnoses:  Pain  RUQ abdominal pain  Idiopathic acute pancreatitis  Colitis      Joanne Gavel, MD 11/04/15 [id number]  Joanne Gavel, MD 11/04/15 [phone]

## 2015-11-04 NOTE — ED Notes (Signed)
Verbal report given to Ena Dawley, [place]

## 2015-11-04 NOTE — ED Notes (Signed)
Pt c/o sharp substernal chest pain that started around 7 or 8pm tonight; constant; sharp pain;  nausea, no vomiting; some shortness of breath; history of MI x 2;

## 2015-11-08 ENCOUNTER — Ambulatory Visit (INDEPENDENT_AMBULATORY_CARE_PROVIDER_SITE_OTHER): Payer: BC Managed Care – PPO | Admitting: Internal Medicine

## 2015-11-08 ENCOUNTER — Encounter: Payer: Self-pay | Admitting: Internal Medicine

## 2015-11-08 VITALS — BP 110/70 | HR 81 | Temp 98.1°F | Wt 253.0 lb

## 2015-11-08 DIAGNOSIS — R1013 Epigastric pain: Secondary | ICD-10-CM

## 2015-11-08 NOTE — Assessment & Plan Note (Signed)
Clinical history not consistent with colitis Not constipated ?acid related Will stop the antibiotics Recommended 2 weeks omeprazole if symptoms persist

## 2015-11-08 NOTE — Progress Notes (Signed)
Subjective:    Patient ID: Matthew Doyle, male    DOB: 1956/04/08, 59 y.o.   MRN: [id number]  HPI Here for ER follow up Was there 4 days ago  Having epigastric pain--started about 3 hours before going to ER Tender over the xiphoid Was concerned about it being his heart (though not typical) Had pizza before the pain came on  No history of heartburn Had bad, sharp pain Eventually the pain went away while he was there  Ultrasound benign CT basically negative--just some thickening in colon---- ?colitis  No pain since then Some nausea --no vomiting since ER visit  Current Outpatient Prescriptions on File Prior to Visit  Medication Sig Dispense Refill  . amLODipine (NORVASC) 10 MG tablet Take 1 tablet (10 mg total) by mouth daily. 90 tablet 3  . amoxicillin-clavulanate (AUGMENTIN) 875-125 MG tablet Take 1 tablet by mouth 2 (two) times daily. 14 tablet 0  . aspirin 81 MG tablet Take 1 tablet (81 mg total) by mouth daily. 30 tablet   . atorvastatin (LIPITOR) 80 MG tablet TAKE ONE TABLET BY MOUTH ONCE DAILY 6PM 30 tablet 5  . COLCRYS 0.6 MG tablet TAKE ONE TABLET BY MOUTH TWICE DAILY AS NEEDED 60 tablet 0  . losartan (COZAAR) 100 MG tablet Take 1 tablet (100 mg total) by mouth daily. 90 tablet 3  . metroNIDAZOLE (FLAGYL) 500 MG tablet Take 1 tablet (500 mg total) by mouth 2 (two) times daily. 14 tablet 0  . nitroGLYCERIN (NITROSTAT) 0.4 MG SL tablet Place 1 tablet (0.4 mg total) under the tongue every 5 (five) minutes as needed for chest pain. 25 tablet 3  . ondansetron (ZOFRAN ODT) 4 MG disintegrating tablet Take 1 tablet (4 mg total) by mouth every 8 (eight) hours as needed for nausea or vomiting. 12 tablet 0  . oxyCODONE (ROXICODONE) 5 MG immediate release tablet Take 1 tablet (5 mg total) by mouth every 6 (six) hours as needed for moderate pain. Do not drive while taking this medication. 15 tablet 0   No current facility-administered medications on file prior to visit.     Allergies  Allergen Reactions  . Lisinopril     REACTION: cough with this and/or benazepril    Past Medical History  Diagnosis Date  . Hypertension   . Hyperlipidemia   . Atypical chest pain   . Hemorrhoids   . Colon polyps   . Diverticulosis   . Gout   . Internal hemorrhoids without mention of complication   . CAD (coronary artery disease)     a. 09/2003 Cath/PCI: LAD 80%->3.0x23 Cypher DES;  b. 03/2004 Cath: LM nl, LAD 20p, 47m, patent stent, D1 25, D2 small, 80/jailed, LCX 16m, OM1 nl, OM2 20, RCA 40-50p, PDA 13m;  c. Non-ST elevation MI in January of 2014. Cardiac cath showed patent LAD stent, included distal right PDA supplying a small territory, 99% proximal OM 2 status post PCI and DES placement   . Arthritis     knee  . MI (myocardial infarction) ([place])     last one- 12-16-12    Past Surgical History  Procedure Laterality Date  . Cypher stent  10/04    mid LAD- gupta  . Cardiac catheterization  2014    s/p stent  . Left heart catheterization with coronary angiogram N/A 12/17/2012    Procedure: LEFT HEART CATHETERIZATION WITH CORONARY ANGIOGRAM;  Surgeon: Peter M Martinique, MD;  Location: [facility] CATH LAB;  Service: Cardiovascular;  Laterality: N/A;  .  Percutaneous coronary stent intervention (pci-s) Right 12/17/2012    Procedure: PERCUTANEOUS CORONARY STENT INTERVENTION (PCI-S);  Surgeon: Peter M Martinique, MD;  Location: [facility] CATH LAB;  Service: Cardiovascular;  Laterality: Right;    Family History  Problem Relation Age of Onset  . Diabetes Father   . Coronary artery disease Father     CABG - 5 vessel 15 yr ago  . Hypertension Brother   . Hypertension Brother   . Arrhythmia Brother   . Heart attack Paternal Uncle     Age 26  . Heart attack Maternal Uncle     Age 75  . Coronary artery disease Maternal Grandfather   . Heart attack Maternal Grandfather     Died in 91s  . Throat cancer Maternal Grandfather   . Hypertension Mother   . Colon cancer Neg Hx   . Esophageal  cancer Neg Hx   . Rectal cancer Neg Hx   . Stomach cancer Neg Hx     Social History   Social History  . Marital Status: Married    Spouse Name: N/A  . Number of Children: 2  . Years of Education: N/A   Occupational History  .     [employer] [place] History Main Topics  . Smoking status: Never Smoker   . Smokeless tobacco: Never Used  . Alcohol Use: No  . Drug Use: No  . Sexual Activity: Not on file   Other Topics Concern  . Not on file   Social History Narrative   Retired as Social research officer, government for [employer]         Review of Systems  Appetite is okay No fever No cough or breathing problems No heavy lifting or known strain for his abdominal wall     Objective:   Physical Exam  Constitutional: He appears well-developed and well-nourished. No distress.  Neck: Normal range of motion. Neck supple. No thyromegaly present.  Pulmonary/Chest: Effort normal and breath sounds normal. No respiratory distress. He has no wheezes. He has no rales.  Abdominal: Soft. Bowel sounds are normal. He exhibits no distension. There is no rebound and no guarding.  Very slight epigastric tenderness  Lymphadenopathy:    He has no cervical adenopathy.          Assessment & Plan:

## 2015-11-08 NOTE — Progress Notes (Signed)
Pre visit review using our clinic review tool, if applicable. No additional management support is needed unless otherwise documented below in the visit note. 

## 2015-11-08 NOTE — Patient Instructions (Signed)
Please stop the antibiotics. If your pain continues, try 2 weeks of over the counter omeprazole (prilosec) 20mg  daily.

## 2015-11-09 ENCOUNTER — Ambulatory Visit: Payer: Self-pay | Admitting: Internal Medicine

## 2016-02-25 ENCOUNTER — Telehealth: Payer: Self-pay | Admitting: Internal Medicine

## 2016-02-25 NOTE — Telephone Encounter (Signed)
Pt has appt with Dr Silvio Pate on 02/26/16 at 12:30

## 2016-02-25 NOTE — Telephone Encounter (Signed)
Spoke to wife per DPR 

## 2016-02-25 NOTE — Telephone Encounter (Signed)
Please have him go to ER if pain gets severe before appt

## 2016-02-25 NOTE — Telephone Encounter (Signed)
[place] [facility]  Patient Name: Matthew Doyle  DOB: [birth year]    Initial Comment Caller states her husband has having right upper quadrant pain near ribcage, on Prilosec, denies chest pain, pressue or tightness,    Nurse Assessment  Nurse: Wayne Sever, RN, Tillie Rung Date/Time (Eastern Time): 02/25/2016 9:20:10 AM  Confirm and document reason for call. If symptomatic, describe symptoms. You must click the next button to save text entered. ---Caller states he has pain on the right upper rib cage. He states it moves down the right side. He states it's been off and on for a few months. He spent the night in the hospital in December getting checked out. The pain comes and goes per caller  Has the patient traveled out of the country within the last 30 days? ---Not Applicable  Does the patient have any new or worsening symptoms? ---Yes  Will a triage be completed? ---Yes  Related visit to physician within the last 2 weeks? ---No  Does the PT have any chronic conditions? (i.e. diabetes, asthma, etc.) ---Yes  List chronic conditions. ---HTN, MI x 2, High Cholesterol.  Is this a behavioral health or substance abuse call? ---No     Guidelines    Guideline Title Affirmed Question Affirmed Notes  Flank Pain [1] MILD pain (i.e., scale 1-3; does not interfere with normal activities) AND [2] present > 3 days    Final Disposition User   See PCP When Office is Open (within 3 days) Wayne Sever, RN, Tillie Rung    Comments  Wife states he is having right upper quadrant pain. Wife states he cannot get phone calls at work, she is not with him. She is going to give me his number of [phone]. I will try him shortly, she is going to call him at work and see about him answering so I can triage.  Appointment scheduled on 03/28 at 1230p with Dr Silvio Pate   Referrals  REFERRED TO PCP OFFICE   Disagree/Comply: Comply

## 2016-02-26 ENCOUNTER — Encounter: Payer: Self-pay | Admitting: Internal Medicine

## 2016-02-26 ENCOUNTER — Ambulatory Visit (INDEPENDENT_AMBULATORY_CARE_PROVIDER_SITE_OTHER): Payer: BC Managed Care – PPO | Admitting: Internal Medicine

## 2016-02-26 VITALS — BP 112/84 | HR 64 | Temp 98.6°F | Wt 249.0 lb

## 2016-02-26 DIAGNOSIS — R1011 Right upper quadrant pain: Secondary | ICD-10-CM

## 2016-02-26 NOTE — Progress Notes (Signed)
Subjective:    Patient ID: Matthew Doyle, male    DOB: 05/29/1956, 60 y.o.   MRN: [id number]  HPI Here due to recurrent abdominal pain No longer epigastric Now it starts under his rib cage and goes down abdomen a bit Sore under rib cage  Never really went away since ER visit in December Especially worse in past week or so---and worse as the day goes on Dull pain in AM--worsens over time. Then really bad by evening Not clearly related to eating--but seems worse if he has large meal  Started taking omeprazole when flare started--but no clear help with it  Current Outpatient Prescriptions on File Prior to Visit  Medication Sig Dispense Refill  . amLODipine (NORVASC) 10 MG tablet Take 1 tablet (10 mg total) by mouth daily. 90 tablet 3  . aspirin 81 MG tablet Take 1 tablet (81 mg total) by mouth daily. 30 tablet   . atorvastatin (LIPITOR) 80 MG tablet TAKE ONE TABLET BY MOUTH ONCE DAILY 6PM 30 tablet 5  . COLCRYS 0.6 MG tablet TAKE ONE TABLET BY MOUTH TWICE DAILY AS NEEDED 60 tablet 0  . losartan (COZAAR) 100 MG tablet Take 1 tablet (100 mg total) by mouth daily. 90 tablet 3  . nitroGLYCERIN (NITROSTAT) 0.4 MG SL tablet Place 1 tablet (0.4 mg total) under the tongue every 5 (five) minutes as needed for chest pain. 25 tablet 3   No current facility-administered medications on file prior to visit.    Allergies  Allergen Reactions  . Lisinopril     REACTION: cough with this and/or benazepril    Past Medical History  Diagnosis Date  . Hypertension   . Hyperlipidemia   . Atypical chest pain   . Hemorrhoids   . Colon polyps   . Diverticulosis   . Gout   . Internal hemorrhoids without mention of complication   . CAD (coronary artery disease)     a. 09/2003 Cath/PCI: LAD 80%->3.0x23 Cypher DES;  b. 03/2004 Cath: LM nl, LAD 20p, 68m, patent stent, D1 25, D2 small, 80/jailed, LCX 86m, OM1 nl, OM2 20, RCA 40-50p, PDA 68m;  c. Non-ST elevation MI in January of 2014. Cardiac cath  showed patent LAD stent, included distal right PDA supplying a small territory, 99% proximal OM 2 status post PCI and DES placement   . Arthritis     knee  . MI (myocardial infarction) ([place])     last one- 12-16-12    Past Surgical History  Procedure Laterality Date  . Cypher stent  10/04    mid LAD- gupta  . Cardiac catheterization  2014    s/p stent  . Left heart catheterization with coronary angiogram N/A 12/17/2012    Procedure: LEFT HEART CATHETERIZATION WITH CORONARY ANGIOGRAM;  Surgeon: Peter M Martinique, MD;  Location: [facility] CATH LAB;  Service: Cardiovascular;  Laterality: N/A;  . Percutaneous coronary stent intervention (pci-s) Right 12/17/2012    Procedure: PERCUTANEOUS CORONARY STENT INTERVENTION (PCI-S);  Surgeon: Peter M Martinique, MD;  Location: [facility] CATH LAB;  Service: Cardiovascular;  Laterality: Right;    Family History  Problem Relation Age of Onset  . Diabetes Father   . Coronary artery disease Father     CABG - 5 vessel 15 yr ago  . Hypertension Brother   . Hypertension Brother   . Arrhythmia Brother   . Heart attack Paternal Uncle     Age 39  . Heart attack Maternal Uncle     Age 29  .  Coronary artery disease Maternal Grandfather   . Heart attack Maternal Grandfather     Died in 41s  . Throat cancer Maternal Grandfather   . Hypertension Mother   . Colon cancer Neg Hx   . Esophageal cancer Neg Hx   . Rectal cancer Neg Hx   . Stomach cancer Neg Hx     Social History   Social History  . Marital Status: Married    Spouse Name: N/A  . Number of Children: 2  . Years of Education: N/A   Occupational History  .     [employer] [place] History Main Topics  . Smoking status: Never Smoker   . Smokeless tobacco: Never Used  . Alcohol Use: No  . Drug Use: No  . Sexual Activity: Not on file   Other Topics Concern  . 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 Occasionally takes miralax (or dulcolax pills) No fever No nausea or vomting  No cough or breathing problems    Objective:   Physical Exam  Constitutional: He appears well-developed and well-nourished. No distress.  Pulmonary/Chest: Effort normal and breath sounds normal. No respiratory distress. He has no wheezes. He has no rales. He exhibits no tenderness.  No rib tenderness  Abdominal: Soft. Bowel sounds are normal. He exhibits no distension and no mass. There is no rebound and no guarding.  Mildly positive Murphy's sign          Assessment & Plan:

## 2016-02-26 NOTE — Assessment & Plan Note (Signed)
Formerly epigastric--now RUQ and mildly positive Murphy's sign Ultrasound and CT normal ??gallbladder hypofunction Will check HIDA---- to surgeon if abnormal (not sure [place] or [place]) If negative, will set up with GI

## 2016-02-26 NOTE — Progress Notes (Signed)
Pre visit review using our clinic review tool, if applicable. No additional management support is needed unless otherwise documented below in the visit note. 

## 2016-02-28 ENCOUNTER — Ambulatory Visit
Admission: RE | Admit: 2016-02-28 | Discharge: 2016-02-28 | Disposition: A | Discharge status: Home / Self Care | Payer: BC Managed Care – PPO | Source: Ambulatory Visit | Attending: Internal Medicine | Admitting: Internal Medicine

## 2016-02-28 DIAGNOSIS — R1011 Right upper quadrant pain: Secondary | ICD-10-CM | POA: Diagnosis not present

## 2016-02-28 MED ORDER — SINCALIDE 5 MCG IJ SOLR
0.0200 ug/kg | Freq: Once | INTRAMUSCULAR | Status: AC
  Administered 2016-02-28: 2.26 ug via INTRAVENOUS

## 2016-02-28 MED ORDER — TECHNETIUM TC 99M MEBROFENIN IV KIT
5.2700 | PACK | Freq: Once | INTRAVENOUS | Status: AC | PRN
  Administered 2016-02-28: 5.27 via INTRAVENOUS

## 2016-02-29 ENCOUNTER — Encounter: Payer: Self-pay | Admitting: Internal Medicine

## 2016-02-29 DIAGNOSIS — R1011 Right upper quadrant pain: Secondary | ICD-10-CM

## 2016-03-05 ENCOUNTER — Encounter: Payer: Self-pay | Admitting: General Surgery

## 2016-03-05 ENCOUNTER — Ambulatory Visit (INDEPENDENT_AMBULATORY_CARE_PROVIDER_SITE_OTHER): Payer: BC Managed Care – PPO | Admitting: General Surgery

## 2016-03-05 VITALS — BP 134/78 | HR 74 | Resp 14 | Ht 72.0 in | Wt 250.0 lb

## 2016-03-05 DIAGNOSIS — R1011 Right upper quadrant pain: Secondary | ICD-10-CM | POA: Diagnosis not present

## 2016-03-05 NOTE — Progress Notes (Signed)
Patient ID: Matthew Doyle, male   DOB: 1956-04-14, 60 y.o.   MRN: [id number]  Chief Complaint  Patient presents with  . Other    gall bladder    HPI Matthew Doyle is a 60 y.o. male.  Here today for evaluation of his gall bladder.  He states he has been having abdominal  Pain in his right upper quadrant for about six months now. Pain last for a hour or longer.Usually pain durning the day time, he states feels better when lying down. He states when he had his HIDA scan done on the second injection he felt like there was a stabbing pain in his right uppper quadrant.  CT and ultrasound was 11-03-16 and HIDA scan was 02-28-16.  I have reviewed the history of present illness with the patient.  HPI  Past Medical History  Diagnosis Date  . Hypertension   . Hyperlipidemia   . Atypical chest pain   . Hemorrhoids   . Colon polyps   . Diverticulosis   . Gout   . Internal hemorrhoids without mention of complication   . CAD (coronary artery disease)     a. 09/2003 Cath/PCI: LAD 80%->3.0x23 Cypher DES;  b. 03/2004 Cath: LM nl, LAD 20p, 2m, patent stent, D1 25, D2 small, 80/jailed, LCX 30m, OM1 nl, OM2 20, RCA 40-50p, PDA 41m;  c. Non-ST elevation MI in January of 2014. Cardiac cath showed patent LAD stent, included distal right PDA supplying a small territory, 99% proximal OM 2 status post PCI and DES placement   . Arthritis     knee  . MI (myocardial infarction) ([place])     last one- 12-16-12    Past Surgical History  Procedure Laterality Date  . Cypher stent  10/04    mid LAD- gupta  . Cardiac catheterization  2014    s/p stent  . Left heart catheterization with coronary angiogram N/A 12/17/2012    Procedure: LEFT HEART CATHETERIZATION WITH CORONARY ANGIOGRAM;  Surgeon: Peter M Martinique, MD;  Location: [facility] CATH LAB;  Service: Cardiovascular;  Laterality: N/A;  . Percutaneous coronary stent intervention (pci-s) Right 12/17/2012    Procedure: PERCUTANEOUS CORONARY STENT INTERVENTION (PCI-S);   Surgeon: Peter M Martinique, MD;  Location: [facility] CATH LAB;  Service: Cardiovascular;  Laterality: Right;  . Colonoscopy  10/2013    Family History  Problem Relation Age of Onset  . Diabetes Father   . Coronary artery disease Father     CABG - 5 vessel 15 yr ago  . Hypertension Brother   . Hypertension Brother   . Arrhythmia Brother   . Heart attack Paternal Uncle     Age 37  . Heart attack Maternal Uncle     Age 52  . Coronary artery disease Maternal Grandfather   . Heart attack Maternal Grandfather     Died in 45s  . Throat cancer Maternal Grandfather   . Hypertension Mother   . Colon cancer Neg Hx   . Esophageal cancer Neg Hx   . Rectal cancer Neg Hx   . Stomach cancer Neg Hx     Social History Social History  Substance Use Topics  . Smoking status: Never Smoker   . Smokeless tobacco: Never Used  . Alcohol Use: No    Allergies  Allergen Reactions  . Lisinopril     REACTION: cough with this and/or benazepril    Current Outpatient Prescriptions  Medication Sig Dispense Refill  . amLODipine (NORVASC) 10 MG tablet Take 1  tablet (10 mg total) by mouth daily. 90 tablet 3  . aspirin 81 MG tablet Take 1 tablet (81 mg total) by mouth daily. 30 tablet   . atorvastatin (LIPITOR) 80 MG tablet TAKE ONE TABLET BY MOUTH ONCE DAILY 6PM 30 tablet 5  . COLCRYS 0.6 MG tablet TAKE ONE TABLET BY MOUTH TWICE DAILY AS NEEDED 60 tablet 0  . losartan (COZAAR) 100 MG tablet Take 1 tablet (100 mg total) by mouth daily. 90 tablet 3  . nitroGLYCERIN (NITROSTAT) 0.4 MG SL tablet Place 1 tablet (0.4 mg total) under the tongue every 5 (five) minutes as needed for chest pain. 25 tablet 3   No current facility-administered medications for this visit.    Review of Systems Review of Systems  Constitutional: Negative.   Respiratory: Negative.   Cardiovascular: Negative.   Gastrointestinal: Positive for abdominal pain and constipation.    Blood pressure 134/78, pulse 74, resp. rate 14, height 6'  (1.829 m), weight 250 lb (113.399 kg).  Physical Exam Physical Exam  Constitutional: He is oriented to person, place, and time. He appears well-nourished.  Eyes: Conjunctivae are normal. No scleral icterus.  Neck: Neck supple.  Cardiovascular: Normal rate, regular rhythm and normal heart sounds.   Pulmonary/Chest: Effort normal and breath sounds normal.  Abdominal: Soft. Bowel sounds are normal. He exhibits no mass. There is no hepatomegaly. There is no tenderness.  Lymphadenopathy:    He has no cervical adenopathy.  Neurological: He is alert and oriented to person, place, and time.  Skin: Skin is warm and dry.    Data Reviewed Notes, CT scan and HIDA reviewed Only abnormal study was HID which showed gb EF of 9 %  Assessment    Abdominal pain atypical for gallbladder source- low EF on HIDA.      Plan   Discussed fully with pt. In the abscence of any other findings to explain his pain cholecystectomy can be considered but no guarantee that his pain will be resolved.  Pt is not inclined to have surgery at present- may consider it when school closes for summer.     Patient to return in two months. He is to call if pain worsens, has fever/chills associated with pain.  Dr. Tyrell Antonio office is aware that we are requesting cardiac clearance prior to gallbladder removal. Patient is currently scheduled for an appointment with Dr. Fletcher Anon for 04-04-16.   PCP:  Silvio Pate,  This information has been scribed by Gaspar Cola CMA.   Jolita Haefner G 03/06/2016, 11:51 AM

## 2016-03-05 NOTE — Patient Instructions (Addendum)
The patient is aware to call back for any questions or concerns.  Laparoscopic Cholecystectomy Laparoscopic cholecystectomy is surgery to remove the gallbladder. The gallbladder is located in the upper right part of the abdomen, behind the liver. It is a storage sac for bile, which is produced in the liver. Bile aids in the digestion and absorption of fats. Cholecystectomy is often done for inflammation of the gallbladder (cholecystitis). This condition is usually caused by a buildup of gallstones (cholelithiasis) in the gallbladder. Gallstones can block the flow of bile, and that can result in inflammation and pain. In severe cases, emergency surgery may be required. If emergency surgery is not required, you will have time to prepare for the procedure. Laparoscopic surgery is an alternative to open surgery. Laparoscopic surgery has a shorter recovery time. Your common bile duct may also need to be examined during the procedure. If stones are found in the common bile duct, they may be removed. LET YOUR HEALTH CARE PROVIDER KNOW ABOUT:  Any allergies you have.  All medicines you are taking, including vitamins, herbs, eye drops, creams, and over-the-counter medicines.  Previous problems you or members of your family have had with the use of anesthetics.  Any blood disorders you have.  Previous surgeries you have had.  Any medical conditions you have. RISKS AND COMPLICATIONS Generally, this is a safe procedure. However, problems may occur, including:  Infection.  Bleeding.  Allergic reactions to medicines.  Damage to other structures or organs.  A stone remaining in the common bile duct.  A bile leak from the cyst duct that is clipped when your gallbladder is removed.  The need to convert to open surgery, which requires a larger incision in the abdomen. This may be necessary if your surgeon thinks that it is not safe to continue with a laparoscopic procedure. BEFORE THE  PROCEDURE  Ask your health care provider about:  Changing or stopping your regular medicines. This is especially important if you are taking diabetes medicines or blood thinners.  Taking medicines such as aspirin and ibuprofen. These medicines can thin your blood. Do not take these medicines before your procedure if your health care provider instructs you not to.  Follow instructions from your health care provider about eating or drinking restrictions.  Let your health care provider know if you develop a cold or an infection before surgery.  Plan to have someone take you home after the procedure.  Ask your health care provider how your surgical site will be marked or identified.  You may be given antibiotic medicine to help prevent infection. PROCEDURE  To reduce your risk of infection:  Your health care team will wash or sanitize their hands.  Your skin will be washed with soap.  An IV tube may be inserted into one of your veins.  You will be given a medicine to make you fall asleep (general anesthetic).  A breathing tube will be placed in your mouth.  The surgeon will make several small cuts (incisions) in your abdomen.  A thin, lighted tube (laparoscope) that has a tiny camera on the end will be inserted through one of the small incisions. The camera on the laparoscope will send a picture to a TV screen (monitor) in the operating room. This will give the surgeon a good view inside your abdomen.  A gas will be pumped into your abdomen. This will expand your abdomen to give the surgeon more room to perform the surgery.  Other tools that are needed   for the procedure will be inserted through the other incisions. The gallbladder will be removed through one of the incisions.  After your gallbladder has been removed, the incisions will be closed with stitches (sutures), staples, or skin glue.  Your incisions may be covered with a bandage (dressing). The procedure may vary among  health care providers and hospitals. AFTER THE PROCEDURE  Your blood pressure, heart rate, breathing rate, and blood oxygen level will be monitored often until the medicines you were given have worn off.  You will be given medicines as needed to control your pain.   This information is not intended to replace advice given to you by your health care provider. Make sure you discuss any questions you have with your health care provider.   Document Released: 11/17/2005 Document Revised: 08/08/2015 Document Reviewed: 06/29/2013 Elsevier Interactive Patient Education 2016 [employer] Inc[employer]  

## 2016-03-06 ENCOUNTER — Encounter: Payer: Self-pay | Admitting: General Surgery

## 2016-03-09 ENCOUNTER — Observation Stay
Admission: EM | Admit: 2016-03-09 | Discharge: 2016-03-10 | Disposition: A | Discharge status: Home / Self Care | Payer: BC Managed Care – PPO | Attending: Internal Medicine | Admitting: Internal Medicine

## 2016-03-09 ENCOUNTER — Emergency Department: Payer: BC Managed Care – PPO

## 2016-03-09 DIAGNOSIS — K5790 Diverticulosis of intestine, part unspecified, without perforation or abscess without bleeding: Secondary | ICD-10-CM | POA: Diagnosis not present

## 2016-03-09 DIAGNOSIS — Z86010 Personal history of colonic polyps: Secondary | ICD-10-CM | POA: Diagnosis not present

## 2016-03-09 DIAGNOSIS — R1013 Epigastric pain: Secondary | ICD-10-CM | POA: Insufficient documentation

## 2016-03-09 DIAGNOSIS — M1990 Unspecified osteoarthritis, unspecified site: Secondary | ICD-10-CM | POA: Diagnosis not present

## 2016-03-09 DIAGNOSIS — Z833 Family history of diabetes mellitus: Secondary | ICD-10-CM | POA: Insufficient documentation

## 2016-03-09 DIAGNOSIS — Z8249 Family history of ischemic heart disease and other diseases of the circulatory system: Secondary | ICD-10-CM | POA: Diagnosis not present

## 2016-03-09 DIAGNOSIS — R109 Unspecified abdominal pain: Secondary | ICD-10-CM | POA: Diagnosis present

## 2016-03-09 DIAGNOSIS — Z955 Presence of coronary angioplasty implant and graft: Secondary | ICD-10-CM | POA: Insufficient documentation

## 2016-03-09 DIAGNOSIS — K648 Other hemorrhoids: Secondary | ICD-10-CM | POA: Insufficient documentation

## 2016-03-09 DIAGNOSIS — Z800 Family history of malignant neoplasm of digestive organs: Secondary | ICD-10-CM | POA: Diagnosis not present

## 2016-03-09 DIAGNOSIS — D72829 Elevated white blood cell count, unspecified: Secondary | ICD-10-CM | POA: Diagnosis not present

## 2016-03-09 DIAGNOSIS — M109 Gout, unspecified: Secondary | ICD-10-CM | POA: Insufficient documentation

## 2016-03-09 DIAGNOSIS — I10 Essential (primary) hypertension: Secondary | ICD-10-CM | POA: Insufficient documentation

## 2016-03-09 DIAGNOSIS — Z888 Allergy status to other drugs, medicaments and biological substances status: Secondary | ICD-10-CM | POA: Diagnosis not present

## 2016-03-09 DIAGNOSIS — Z7982 Long term (current) use of aspirin: Secondary | ICD-10-CM | POA: Diagnosis not present

## 2016-03-09 DIAGNOSIS — R112 Nausea with vomiting, unspecified: Secondary | ICD-10-CM | POA: Insufficient documentation

## 2016-03-09 DIAGNOSIS — I2510 Atherosclerotic heart disease of native coronary artery without angina pectoris: Secondary | ICD-10-CM | POA: Diagnosis not present

## 2016-03-09 DIAGNOSIS — R0600 Dyspnea, unspecified: Secondary | ICD-10-CM | POA: Diagnosis not present

## 2016-03-09 DIAGNOSIS — K5900 Constipation, unspecified: Secondary | ICD-10-CM | POA: Diagnosis not present

## 2016-03-09 DIAGNOSIS — I252 Old myocardial infarction: Secondary | ICD-10-CM | POA: Diagnosis not present

## 2016-03-09 DIAGNOSIS — Z01810 Encounter for preprocedural cardiovascular examination: Secondary | ICD-10-CM | POA: Insufficient documentation

## 2016-03-09 DIAGNOSIS — Z79899 Other long term (current) drug therapy: Secondary | ICD-10-CM | POA: Diagnosis not present

## 2016-03-09 DIAGNOSIS — E785 Hyperlipidemia, unspecified: Secondary | ICD-10-CM | POA: Insufficient documentation

## 2016-03-09 DIAGNOSIS — R1011 Right upper quadrant pain: Principal | ICD-10-CM | POA: Insufficient documentation

## 2016-03-09 DIAGNOSIS — R0789 Other chest pain: Secondary | ICD-10-CM | POA: Diagnosis not present

## 2016-03-09 DIAGNOSIS — R10A0 Flank pain, unspecified side: Secondary | ICD-10-CM | POA: Diagnosis present

## 2016-03-09 LAB — BASIC METABOLIC PANEL
Anion gap: 9 (ref 5–15)
BUN: 19 mg/dL (ref 6–20)
CHLORIDE: 107 mmol/L (ref 101–111)
CO2: 23 mmol/L (ref 22–32)
CREATININE: 1.19 mg/dL (ref 0.61–1.24)
Calcium: 9.2 mg/dL (ref 8.9–10.3)
GFR calc Af Amer: >60 mL/min (ref >60)
GFR calc non Af Amer: >60 mL/min (ref >60)
GLUCOSE: 172 mg/dL — AB (ref 65–99)
Potassium: 3.5 mmol/L (ref 3.5–5.1)
SODIUM: 139 mmol/L (ref 135–145)

## 2016-03-09 LAB — HEPATIC FUNCTION PANEL
ALK PHOS: 54 U/L (ref 38–126)
ALT: 23 U/L (ref 17–63)
AST: 25 U/L (ref 15–41)
Albumin: 4.1 g/dL (ref 3.5–5.0)
BILIRUBIN DIRECT: 0.2 mg/dL (ref 0.1–0.5)
BILIRUBIN INDIRECT: 0.7 mg/dL (ref 0.3–0.9)
BILIRUBIN TOTAL: 0.9 mg/dL (ref 0.3–1.2)
Total Protein: 7.6 g/dL (ref 6.5–8.1)

## 2016-03-09 LAB — CBC
HEMATOCRIT: 43 % (ref 40.0–52.0)
Hemoglobin: 14.4 g/dL (ref 13.0–18.0)
MCH: 28.5 pg (ref 26.0–34.0)
MCHC: 33.6 g/dL (ref 32.0–36.0)
MCV: 85 fL (ref 80.0–100.0)
PLATELETS: 207 10*3/uL (ref 150–440)
RBC: 5.06 MIL/uL (ref 4.40–5.90)
RDW: 14.1 % (ref 11.5–14.5)
WBC: 13 10*3/uL — ABNORMAL HIGH (ref 3.8–10.6)

## 2016-03-09 LAB — TROPONIN I: Troponin I: <0.03 ng/mL (ref <0.031)

## 2016-03-09 LAB — LIPASE, BLOOD: LIPASE: 30 U/L (ref 11–51)

## 2016-03-09 LAB — ETHANOL: Alcohol, Ethyl (B): <5 mg/dL (ref <5)

## 2016-03-09 MED ORDER — SODIUM CHLORIDE 0.9 % IV BOLUS (SEPSIS)
1000.0000 mL | Freq: Once | INTRAVENOUS | Status: AC
  Administered 2016-03-09: 1000 mL via INTRAVENOUS

## 2016-03-09 MED ORDER — ONDANSETRON HCL 4 MG/2ML IJ SOLN
4.0000 mg | Freq: Once | INTRAMUSCULAR | Status: AC
  Administered 2016-03-09: 4 mg via INTRAVENOUS
  Filled 2016-03-09: qty 2

## 2016-03-09 MED ORDER — MORPHINE SULFATE (PF) 4 MG/ML IV SOLN
4.0000 mg | Freq: Once | INTRAVENOUS | Status: AC
  Administered 2016-03-09: 4 mg via INTRAVENOUS
  Filled 2016-03-09: qty 1

## 2016-03-09 NOTE — ED Provider Notes (Signed)
[facility] Emergency Department Provider Note  ____________________________________________  Time seen: Approximately 11:11 PM  I have reviewed the triage vital signs and the nursing notes.   HISTORY  Chief Complaint Chest Pain    HPI Matthew Doyle is a 60 y.o. male who presents to the ED from home with a chief complaint of epigastric and right upper quadrant pain. Patient has had right upper quadrant discomfort for 6 months. Recently had an abnormal HIDA scan showing 9% gallbladder function on 02/28/2016. Had an evaluation with Dr. Jamal Collin from surgery on 03/06/2016; patient states they decided to hold off surgery as cholecystectomy would not guarantee resolution of patient's pain.Pain started tonight approximately 7 PM; patient ate meatloaf approximately an hour prior. Complains of sharp pain in epigastrium and right upper quadrant radiating to his back. Symptoms associated with nausea and vomiting. Denies recent fever, chills, shortness of breath, diarrhea. Denies recent travel or trauma. Nothing makes his pain better or worse.   Past Medical History  Diagnosis Date  . Hypertension   . Hyperlipidemia   . Atypical chest pain   . Hemorrhoids   . Colon polyps   . Diverticulosis   . Gout   . Internal hemorrhoids without mention of complication   . CAD (coronary artery disease)     a. 09/2003 Cath/PCI: LAD 80%->3.0x23 Cypher DES;  b. 03/2004 Cath: LM nl, LAD 20p, 67m, patent stent, D1 25, D2 small, 80/jailed, LCX 53m, OM1 nl, OM2 20, RCA 40-50p, PDA 32m;  c. Non-ST elevation MI in January of 2014. Cardiac cath showed patent LAD stent, included distal right PDA supplying a small territory, 99% proximal OM 2 status post PCI and DES placement   . Arthritis     knee  . MI (myocardial infarction) ([place])     last one- 12-16-12    Patient Active Problem List   Diagnosis Date Noted  . Intractable abdominal pain 03/10/2016  . RUQ abdominal pain 06/26/2015  .  Constipation 06/26/2015  . 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/28/2015  . Personal history of colonic polyps 08/30/2013  . CAD (coronary artery disease)   . NSTEMI (non-ST elevated myocardial infarction) ([place]) 12/17/2012  . Routine general medical examination at a health care facility 05/13/2012  . GOUT 12/25/2010  . POLYP, COLON 11/01/2007  . DIVERTICULOSIS, COLON 11/01/2007  . Hyperlipidemia 09/17/2007  . Essential hypertension 09/17/2007    Past Surgical History  Procedure Laterality Date  . Cypher stent  10/04    mid LAD- gupta  . Cardiac catheterization  2014    s/p stent  . Left heart catheterization with coronary angiogram N/A 12/17/2012    Procedure: LEFT HEART CATHETERIZATION WITH CORONARY ANGIOGRAM;  Surgeon: Peter M Martinique, MD;  Location: [facility] CATH LAB;  Service: Cardiovascular;  Laterality: N/A;  . Percutaneous coronary stent intervention (pci-s) Right 12/17/2012    Procedure: PERCUTANEOUS CORONARY STENT INTERVENTION (PCI-S);  Surgeon: Peter M Martinique, MD;  Location: [facility] CATH LAB;  Service: Cardiovascular;  Laterality: Right;  . Colonoscopy  10/2013    No current outpatient prescriptions on file.  Allergies Lisinopril  Family History  Problem Relation Age of Onset  . Diabetes Father   . Coronary artery disease Father     CABG - 5 vessel 15 yr ago  . Hypertension Brother   . Hypertension Brother   . Arrhythmia Brother   . Heart attack Paternal Uncle     Age 59  . Heart attack Maternal Uncle     Age  22  . Coronary artery disease Maternal Grandfather   . Heart attack Maternal Grandfather     Died in 42s  . Throat cancer Maternal Grandfather   . Hypertension Mother   . Colon cancer Neg Hx   . Esophageal cancer Neg Hx   . Rectal cancer Neg Hx   . Stomach cancer Neg Hx     Social History Social History  Substance Use Topics  . Smoking status: Never Smoker   . Smokeless tobacco: Never Used  . Alcohol Use: No    Review of Systems  Constitutional: No  fever/chills. Eyes: No visual changes. ENT: No sore throat. Cardiovascular: Denies chest pain. Respiratory: Denies shortness of breath. Gastrointestinal: Positive for abdominal pain.  Positive for nausea and vomiting.  No diarrhea.  No constipation. Genitourinary: Negative for dysuria. Musculoskeletal: Negative for back pain. Skin: Negative for rash. Neurological: Negative for headaches, focal weakness or numbness.  10-point ROS otherwise negative.  ____________________________________________   PHYSICAL EXAM:  VITAL SIGNS: ED Triage Vitals  Enc Vitals Group     BP 03/09/16 2300 156/95 mmHg     Pulse Rate 03/09/16 2300 60     Resp 03/09/16 2300 14     Temp --      Temp src --      SpO2 03/09/16 2300 99 %     Weight --      Height --      Head Cir --      Peak Flow --      Pain Score 03/09/16 2227 8     Pain Loc --      Pain Edu? --      Excl. in [place]? --     Constitutional: Alert and oriented. Well appearing and in moderate acute distress. Eyes: Conjunctivae are normal. PERRL. EOMI. Head: Atraumatic. Nose: No congestion/rhinnorhea. Mouth/Throat: Mucous membranes are moist.  Oropharynx non-erythematous. Neck: No stridor.   Cardiovascular: Normal rate, regular rhythm. Grossly normal heart sounds.  Good peripheral circulation. Respiratory: Normal respiratory effort.  No retractions. Lungs CTAB. Gastrointestinal: Soft and moderately tender to palpation epigastrium and right upper quadrant without rebound or guarding. No distention. No abdominal bruits. No CVA tenderness. Musculoskeletal: No lower extremity tenderness nor edema.  No joint effusions. Neurologic:  Normal speech and language. No gross focal neurologic deficits are appreciated. No gait instability. Skin:  Skin is warm, dry and intact. No rash noted. Psychiatric: Mood and affect are normal. Speech and behavior are normal.  ____________________________________________   LABS (all labs ordered are listed, but  only abnormal results are displayed)  Labs Reviewed  BASIC METABOLIC PANEL - Abnormal; Notable for the following:    Glucose, Bld 172 (*)    All other components within normal limits  CBC - Abnormal; Notable for the following:    WBC 13.0 (*)    All other components within normal limits  TROPONIN I  ETHANOL  HEPATIC FUNCTION PANEL  LIPASE, BLOOD  TSH  HEMOGLOBIN A1C   ____________________________________________  EKG  ED ECG REPORT I, Mahkayla Preece J, the attending physician, personally viewed and interpreted this ECG.   Date: 03/09/2016  EKG Time: 2229  Rate: 63  Rhythm: normal EKG, normal sinus rhythm  Axis: Normal  Intervals:none  ST&T Change: Nonspecific  ____________________________________________  RADIOLOGY  Portable chest x-ray (viewed by me, interpreted per Dr. Alroy Dust): No active disease  Ultrasound abdomen interpreted per Dr. Alroy Dust: Normal liver, gallbladder and bile ducts. ____________________________________________   PROCEDURES  Procedure(s) performed: None  Critical  Care performed: No  ____________________________________________   INITIAL IMPRESSION / ASSESSMENT AND PLAN / ED COURSE  Pertinent labs & imaging results that were available during my care of the patient were reviewed by me and considered in my medical decision making (see chart for details).  60 year old male who presents with right upper quadrant and epigastric pain; currently being worked up by surgery for same. Recent HIDA scan demonstrates abnormal gallbladder function at 9%. Will obtain screening lab work including troponin, initiate IV analgesia, obtain ultrasound to guide for cholecystitis and reassess.  ----------------------------------------- 1:44 AM on 03/10/2016 -----------------------------------------  Updated patient and family of ultrasound results. Paged Dr. Tamala Julian who is on-call for Dr. Jamal Collin to discuss.  ----------------------------------------- 2:16 AM  on 03/10/2016 -----------------------------------------  Discussed with Dr. Tamala Julian who requests admission to the hospital service with cardiology clearance and to consult Dr. Jamal Collin in the hospital. ____________________________________________   FINAL CLINICAL IMPRESSION(S) / ED DIAGNOSES  Final diagnoses:  RUQ abdominal pain  Non-intractable vomiting with nausea, vomiting of unspecified type      Paulette Blanch, MD 03/10/16 [phone]

## 2016-03-09 NOTE — ED Notes (Signed)
MD at bedside. 

## 2016-03-09 NOTE — ED Notes (Signed)
Pt states that he is having center chest pain that radiates through his back, hurts to breathe, radiates to rt side of chest, pt is panting with breathing

## 2016-03-10 ENCOUNTER — Emergency Department: Payer: BC Managed Care – PPO

## 2016-03-10 DIAGNOSIS — Z01810 Encounter for preprocedural cardiovascular examination: Secondary | ICD-10-CM | POA: Insufficient documentation

## 2016-03-10 DIAGNOSIS — R109 Unspecified abdominal pain: Secondary | ICD-10-CM | POA: Diagnosis present

## 2016-03-10 LAB — TSH: TSH: 4.026 u[IU]/mL (ref 0.350–4.500)

## 2016-03-10 LAB — HEMOGLOBIN A1C: Hgb A1c MFr Bld: 5.7 % (ref 4.0–6.0)

## 2016-03-10 MED ORDER — OXYCODONE-ACETAMINOPHEN 5-325 MG PO TABS: 1.0000 | ORAL_TABLET | Freq: Four times a day (QID) | ORAL | Status: DC | PRN

## 2016-03-10 MED ORDER — LOSARTAN POTASSIUM 50 MG PO TABS
100.0000 mg | ORAL_TABLET | Freq: Every day | ORAL | Status: DC
  Administered 2016-03-10: 100 mg via ORAL
  Filled 2016-03-10: qty 2

## 2016-03-10 MED ORDER — OXYCODONE-ACETAMINOPHEN 10-325 MG PO TABS: 1.0000 | ORAL_TABLET | Freq: Three times a day (TID) | ORAL | Status: DC | PRN

## 2016-03-10 MED ORDER — MORPHINE SULFATE (PF) 2 MG/ML IV SOLN: 2.0000 mg | INTRAVENOUS | Status: DC | PRN

## 2016-03-10 MED ORDER — ONDANSETRON HCL 4 MG/2ML IJ SOLN: 4.0000 mg | Freq: Four times a day (QID) | INTRAMUSCULAR | Status: DC | PRN

## 2016-03-10 MED ORDER — SODIUM CHLORIDE 0.9 % IV SOLN
INTRAVENOUS | Status: DC
  Administered 2016-03-10: 06:00:00 via INTRAVENOUS

## 2016-03-10 MED ORDER — SODIUM CHLORIDE 0.9% FLUSH: 3.0000 mL | Freq: Two times a day (BID) | INTRAVENOUS | Status: DC

## 2016-03-10 MED ORDER — ASPIRIN 81 MG PO CHEW
81.0000 mg | CHEWABLE_TABLET | Freq: Every day | ORAL | Status: DC
  Administered 2016-03-10: 81 mg via ORAL
  Filled 2016-03-10: qty 1

## 2016-03-10 MED ORDER — ATORVASTATIN CALCIUM 20 MG PO TABS
80.0000 mg | ORAL_TABLET | Freq: Every day | ORAL | Status: DC
  Administered 2016-03-10: 18:00:00 80 mg via ORAL
  Filled 2016-03-10: qty 4

## 2016-03-10 MED ORDER — COLCHICINE 0.6 MG PO TABS
0.6000 mg | ORAL_TABLET | Freq: Two times a day (BID) | ORAL | Status: DC | PRN
  Filled 2016-03-10: qty 1

## 2016-03-10 MED ORDER — AMLODIPINE BESYLATE 10 MG PO TABS
10.0000 mg | ORAL_TABLET | Freq: Every day | ORAL | Status: DC
Start: 2016-03-10 — End: 2016-03-10
  Administered 2016-03-10: 11:00:00 10 mg via ORAL
  Filled 2016-03-10: qty 1

## 2016-03-10 MED ORDER — ONDANSETRON HCL 4 MG PO TABS: 4.0000 mg | ORAL_TABLET | Freq: Four times a day (QID) | ORAL | Status: DC | PRN

## 2016-03-10 MED ORDER — ACETAMINOPHEN 650 MG RE SUPP: 650.0000 mg | Freq: Four times a day (QID) | RECTAL | Status: DC | PRN

## 2016-03-10 MED ORDER — HEPARIN SODIUM (PORCINE) 5000 UNIT/ML IJ SOLN
5000.0000 [IU] | Freq: Three times a day (TID) | INTRAMUSCULAR | Status: DC
  Administered 2016-03-10: 5000 [IU] via SUBCUTANEOUS
  Filled 2016-03-10: qty 1

## 2016-03-10 MED ORDER — ACETAMINOPHEN 325 MG PO TABS: 650.0000 mg | ORAL_TABLET | Freq: Four times a day (QID) | ORAL | Status: DC | PRN

## 2016-03-10 MED ORDER — ENOXAPARIN SODIUM 40 MG/0.4ML ~~LOC~~ SOLN: 40.0000 mg | SUBCUTANEOUS | Status: DC

## 2016-03-10 NOTE — Progress Notes (Signed)
Discharge paperwork reviewed with patient. Patient verbalized understanding. Prescriptions given.

## 2016-03-10 NOTE — Consult Note (Signed)
Cardiology Consult    Patient ID: Matthew Doyle MRN: [id number], DOB/AGE: [birth year]   Admit date: 03/09/2016 Date of Consult: 03/10/2016  Primary Physician: Viviana Simpler, MD Reason for Consult: Pre-operative Clearance Primary Cardiologist: Dr. Fletcher Anon Requesting Provider: Dr. Tamala Julian  History of Present Illness    Matthew Doyle is a 60 y.o. male with past medical history of CAD (s/p DES to LAD 09/2003, DES to OM in 12/2012), HTN, and HLD who presented to [facility] on 03/09/2016 for evaluation of abdominal pain.  The patient reported having a recent HIDA scan which showed his gallbladder was functioning at 9%. Underwent a surgical consult on 03/06/2016 and it was decided to postpone his surgery unless his symptoms became unbearable. They also requested cardiac clearance prior to a cholecystectomy as well.   On 03/09/2016, he developed a severe discomfort in his epigastric region which radiated into his right upper quadrant. Reports it felt similar to his previous episodes of abdominal pain, but was more intense. Says it felt as if someone was stabbing him in the stomach. Reports associated nausea and vomiting. Occurred after consuming meatloaf for dinner.  While in the ED, WBC was elevated to 13.0. Hgb 14.4. Platelets 207. Electrolytes WNL. Creatinine 1.19. Lipase 30. Troponin negative. EKG showing NSR, HR 64, with nonspecific lateral T-wave abnormality. An abdominal US was obtained while in the ED and showed normal liver, gallbladder, and bile ducts. Surgery was called and recommended admission for him to be seen by Surgery for possible laparoscopic cholecystectomy.  In talking with the patient today, he denied any recent anginal symptoms. 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 Says this pain he is experiencing now feels significantly different  from his past MI's.  Was last seen by Dr. Fletcher Anon in 04/2015 and doing well at that time. Denied any recent anginal symptoms. Was continued on 81mg  ASA, Amlodipine, Losartan, and Atorvastatin at that time. Reports good compliance with his medications and says his BP is well-controlled at home on the current regimen.   Past Medical History   Past Medical History  Diagnosis Date  . Hypertension   . Hyperlipidemia   . Atypical chest pain   . Hemorrhoids   . Colon polyps   . Diverticulosis   . Gout   . Internal hemorrhoids without mention of complication   . CAD (coronary artery disease)     a. 09/2003 Cath/PCI: LAD 80%->3.0x23 Cypher DES;  b. 03/2004 Cath: LM nl, LAD 20p, 36m, patent stent, D1 25, D2 small, 80/jailed, LCX 22m, OM1 nl, OM2 20, RCA 40-50p, PDA 90m;  c. Non-ST elevation MI in January of 2014. Cardiac cath showed patent LAD stent, included distal right PDA supplying a small territory, 99% proximal OM 2 status post PCI and DES placement   . Arthritis     knee  . MI (myocardial infarction) ([place])     last one- 12-16-12    Past Surgical History  Procedure Laterality Date  . Cypher stent  10/04    mid LAD- gupta  . Cardiac catheterization  2014    s/p stent  . Left heart catheterization with coronary angiogram N/A 12/17/2012    Procedure: LEFT HEART CATHETERIZATION WITH CORONARY ANGIOGRAM;  Surgeon: Peter M Martinique, MD;  Location: [facility] C[facility] LAB;  Service: Cardiovascular;  Laterality: N/A;  . Percutaneous coronary stent intervention (pci-s) Right 12/17/2012    Procedure:  PERCUTANEOUS CORONARY STENT INTERVENTION (PCI-S);  Surgeon: Peter M Martinique, MD;  Location: [facility] CATH LAB;  Service: Cardiovascular;  Laterality: Right;  . Colonoscopy  10/2013     Allergies  Allergies  Allergen Reactions  . Lisinopril Other (See Comments)    REACTION: cough with this and/or benazepril    Inpatient Medications    . amLODipine  10 mg Oral Daily  . aspirin  81 mg Oral Daily  . atorvastatin  80  mg Oral q1800  . heparin  5,000 Units Subcutaneous 3 times per day  . losartan  100 mg Oral Daily  . sodium chloride flush  3 mL Intravenous Q12H    Family History    Family History  Problem Relation Age of Onset  . Diabetes Father   . Coronary artery disease Father     CABG - 5 vessel 15 yr ago  . Hypertension Brother   . Hypertension Brother   . Arrhythmia Brother   . Heart attack Paternal Uncle     Age 89  . Heart attack Maternal Uncle     Age 6  . Coronary artery disease Maternal Grandfather   . Heart attack Maternal Grandfather     Died in 31s  . Throat cancer Maternal Grandfather   . Hypertension Mother   . Colon cancer Neg Hx   . Esophageal cancer Neg Hx   . Rectal cancer Neg Hx   . Stomach cancer Neg Hx     Social History    Social History   Social History  . Marital Status: Married    Spouse Name: N/A  . Number of Children: 2  . Years of Education: N/A   Occupational History  .     [employer] History Main Topics  . Smoking status: Never Smoker   . Smokeless tobacco: Never Used  . Alcohol Use: No  . Drug Use: No  . Sexual Activity: Not on file   Other Topics Concern  . Not on file   Social History Narrative   Retired as Social research officer, government for [employer]           Review of Systems    General:  No chills, fever, night sweats or weight changes.  Cardiovascular:  No chest pain, dyspnea on exertion, edema, orthopnea, palpitations, paroxysmal nocturnal dyspnea. Dermatological: No rash, lesions/masses Respiratory: No cough, dyspnea Urologic: No hematuria, dysuria Abdominal:   No diarrhea, bright red blood per rectum, melena, or hematemesis. Positive for nausea, vomiting, and abdominal pain. Neurologic:  No visual changes, wkns, changes in mental status. All other systems reviewed and are otherwise negative except as noted above.  Physical Exam    Blood pressure 118/73, pulse 70, temperature 98.4 F  (36.9 C), temperature source Oral, resp. rate 18, height 6' (1.829 m), weight 241 lb 2 oz (109.374 kg), SpO2 98 %.  General: Pleasant, middle-aged Caucasian male appearing in NAD. Psych: Normal affect. Neuro: Alert and oriented X 3. Moves all extremities spontaneously. HEENT: Normal  Neck: Supple without bruits or JVD. Lungs:  Resp regular and unlabored, CTA without wheezing or rales. Heart: RRR no s3, s4, or murmurs. Abdomen: Soft, non-distended, BS + x 4. Tender to palpation along epigastrium and RUQ. Extremities: No clubbing, cyanosis or edema. DP/PT/Radials 2+ and equal bilaterally.  Labs    Troponin (Point of Care Test) No results for input(s): TROPIPOC in the last 72 hours.  Recent Labs  03/09/16 2245  TROPONINI <0.03  Lab Results  Component Value Date   WBC 13.0* 03/09/2016   HGB 14.4 03/09/2016   HCT 43.0 03/09/2016   MCV 85.0 03/09/2016   PLT 207 03/09/2016    Recent Labs Lab 03/09/16 2245  NA 139  K 3.5  CL 107  CO2 23  BUN 19  CREATININE 1.19  CALCIUM 9.2  PROT 7.6  BILITOT 0.9  ALKPHOS 54  ALT 23  AST 25  GLUCOSE 172*   Lab Results  Component Value Date   CHOL 118 05/28/2015   HDL 29.90* 05/28/2015   LDLCALC 74 05/28/2015   TRIG 69.0 05/28/2015   No results found for: [facility]   Radiology Studies    Nm Hepato W/eject Fract: 02/28/2016  CLINICAL DATA:  60 year old male with right upper quadrant pain for 3-4 months. Subsequent encounter. EXAM: NUCLEAR MEDICINE HEPATOBILIARY IMAGING WITH GALLBLADDER EF TECHNIQUE: Sequential images of the abdomen were obtained out to 60 minutes following intravenous administration of radiopharmaceutical. After slow intravenous infusion of 2.26 micrograms Cholecystokinin, gallbladder ejection fraction was determined. RADIOPHARMACEUTICALS:  5.27 mCi Tc-30m Choletec IV COMPARISON:  11/04/2015 CT and ultrasound. FINDINGS: Prompt uptake and biliary excretion of activity by the liver is seen. Gallbladder activity is  visualized by 10 minutes, consistent with patency of cystic duct. Biliary activity passes into small bowel by 40 minutes, consistent with patent common bile duct. Calculated gallbladder ejection fraction is 9%%. (At 60 min, normal ejection fraction is greater than 40%.) The patient did not experience pain during the infusion of Kinevac. IMPRESSION: Decreased gallbladder ejection fraction of 9%. Electronically Signed   By: Genia Del M.D.   On: 02/28/2016 11:32   Dg Chest Port 1 View: 03/09/2016  CLINICAL DATA:  Right anterior chest pain radiating through to the back. Dyspnea. EXAM: PORTABLE CHEST 1 VIEW COMPARISON:  11/04/2015 FINDINGS: A single AP portable view of the chest demonstrates no focal airspace consolidation or alveolar edema. The lungs are grossly clear. There is no large effusion or pneumothorax. Cardiac and mediastinal contours appear unremarkable. IMPRESSION: No active disease. Electronically Signed   By: Andreas Newport M.D.   On: 03/09/2016 23:44   US Abdomen Limited Ruq: 03/10/2016  CLINICAL DATA:  Epigastric pain radiating to the back for 5 hours. Leukocytosis. EXAM: US ABDOMEN LIMITED - RIGHT UPPER QUADRANT COMPARISON:  None. FINDINGS: Gallbladder: No gallstones or wall thickening visualized. No sonographic Murphy sign noted by sonographer. Common bile duct: Diameter: 4.1 mm Liver: No focal lesion identified. Within normal limits in parenchymal echogenicity. IMPRESSION: Normal liver, gallbladder and bile ducts. Electronically Signed   By: Andreas Newport M.D.   On: 03/10/2016 01:29    EKG & Cardiac Imaging    EKG: NSR, HR 64, with nonspecific lateral T-wave abnormality. No acute changes since previous tracing.  Assessment & Plan    1. Preoperative Clearance for Laparoscopic Cholecystectomy - reports episodes of intermittent RUQ pain for over 6 months. Had a recent HIDA scan which showed his gallbladder was functioning at 9%. Was being followed by Surgery as an outpatient for  likely cholecystectomy this summer however he developed worsening abdominal pain on 03/09/2016 and surgery is anticipating performing a laparoscopic cholecystectomy. - he denies any recent anginal symptoms, at rest or during physical activity. Last stent was placed in 12/2012 with DES to the OM. Reports good compliance with his medications since. - EKG shows NSR, HR 64, with nonspecific lateral T-wave abnormality with no acute changes since his previous tracing.  - he would likely be of low-risk  for the procedure from a cardiac perspective. Denies any recent anginal symptoms and EKG is non-acute, therefore would not anticipate further workup being needed prior to his surgical procedure. MD to see for further assessment.  2. History of CAD - s/p DES to LAD 09/2003, DES to OM in 12/2012. - denies any recent anginal symptoms. EKG without acute ischemic changes. - continue ASA, statin, Amlodipine, and Losartan.  3. HTN - currently well-controlled. - continue Amlodipine 10mg  daily and Losartan 100mg  daily.  4. HLD - continue statin therapy  5. Abdominal Pain - per admitting team and Surgery.  who presented to [facility] on 03/09/2016 for evaluation of abdominal pain.  Signed, Erma Heritage, PA-C 03/10/2016, 10:47 AM Pager: [phone]

## 2016-03-10 NOTE — Consult Note (Addendum)
This patient was seen in my office approximately a week ago when he presented with the episodes of  right upper quadrant pain occasionally radiating around the side and back and sometimes the epigastric area. It previously undergone ultrasound which was normal HIDA scan was abnormal showing an ejection fraction of 9%. Patient has not described the typical postprandial pain suggestive of gallbladder but the attack of pain and location were highly suggestive of biliary source. He did not notice any particular relationship to food or when he ate. After full discussion it was decided that that he may benefit with the cholecystectomy but there was no guarantee that his pain would be resolved. 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 Also he had an upcoming cardiac appointment next month and I thought it would be prudent to obtain a cardiac clearance given his history of coronary artery disease. Patient presented last night with this severe episode of the pain saying that this was as worse that he has had. He had a mild elevation of his white count of 13,000. Ultrasound was again noted to be normal. Since admission here last night the patient has improved and is now whole lot better but states is still a little sore in the right upper quadrant. Patient has been seen by the cardiology service who felt that he was stable enough to undergo the surgery with low risk. Patient at this time is willing to go ahead with the surgery noting that the episodes of become a little more frequent.    Examination he  seems fairly comfortable at this time. Sclerae are nonicteric, abdomen is soft with very mild subjective tenderness in the right upper quadrant. Murphy's sign is absent. His vital signs have been stable and he has had no fever.  Impression and recommendation:  patient appears to have increasing episodes of the same right upper quadrant pain. He has an abnormal ejection  fraction on HIDA scan from a recent evaluation. As discussed with patient today and before we'll plan to proceed with a laparoscopy cholecystectomy on Thursday, 03/13/2016 as outpatient. Patient can therefore be discharged later this evening or tomorrow morning and my office will make the appropriate scheduling for his surgery.

## 2016-03-10 NOTE — Progress Notes (Signed)
Patient seen and examined. Pain improved. We'll start on a low-fat diet.  Discussed with Dr. Jamal Collin. He will see the patient.  Possible discharge later today to follow-up with him as outpatient.

## 2016-03-10 NOTE — ED Notes (Signed)
Patient transported to MRI 

## 2016-03-10 NOTE — ED Notes (Signed)
Patient transported to Ultrasound 

## 2016-03-10 NOTE — ED Notes (Signed)
Pt had multipule episodes where oxygen dropped below 90%. Pt reports having fallen asleep throughout these episodes. Pt placed on 2L [place] due to these drops. MD made aware of situation.

## 2016-03-10 NOTE — Discharge Instructions (Signed)

## 2016-03-10 NOTE — H&P (Signed)
Matthew Doyle is an 60 y.o. male.   Chief Complaint: Abdominal pain HPI: The patient with past medical history of diverticulosis and coronary artery disease presents to the emergency department complaining of abdominal pain and 1 episode of nonbloody nonbilious emesis. The patient has had approximately 6 months of intermittent right upper quadrant pain. He underwent a HIDA scan recently which showed a 9% ejection fraction. He has been seen by Dr. Jamal Collin and has discussed elective cholecystectomy. Tonight the patient had meatloaf for supper and developed excruciating abdominal pain and the effort mentioned episode of nausea and vomiting. In the emergency department right upper quadrant ultrasound showed no evidence of structural. Emergency department staff discussed the patient's case with Dr. Rochel Brome who recommended evaluation in the hospitalist service was called for medical management.  Past Medical History  Diagnosis Date  . Hypertension   . Hyperlipidemia   . Atypical chest pain   . Hemorrhoids   . Colon polyps   . Diverticulosis   . Gout   . Internal hemorrhoids without mention of complication   . CAD (coronary artery disease)     a. 09/2003 Cath/PCI: LAD 80%->3.0x23 Cypher DES;  b. 03/2004 Cath: LM nl, LAD 20p, 78m patent stent, D1 25, D2 small, 80/jailed, LCX 220mOM1 nl, OM2 20, RCA 40-50p, PDA 3036mc. Non-ST elevation MI in January of 2014. Cardiac cath showed patent LAD stent, included distal right PDA supplying a small territory, 99% proximal OM 2 status post PCI and DES placement   . Arthritis     knee  [place]   [place]- [place]12    Past Surgical History  Procedure Laterality Date  . Cypher stent  10/04    mid LAD- gupta  . Cardiac catheterization  2014    s/p stent  . Left heart catheterization with coronary angiogram N/A 12/17/2012    Procedure: LEFT HEART CATHETERIZATION WITH CORONARY ANGIOGRAM;  Surgeon: Peter M JorMartiniqueD;  [facility];  Service: Cardiovascular;  Laterality: N/A;  . Percutaneous coronary stent intervention (pci-s) Right 12/17/2012    Procedure: PERCUTANEOUS CORONARY STENT INTERVENTION (PCI-S);  Surgeon: Peter M JorMartiniqueD;  [facility];  Service: Cardiovascular;  Laterality: Right;  . Colonoscopy  10/2013    Family History  Problem Relation Age of Onset  . Diabetes Father   . Coronary artery disease Father     CABG - 5 vessel 15 yr ago  . Hypertension Brother   . Hypertension Brother   . Arrhythmia Brother   . Heart attack Paternal Uncle     Age 18 50 Heart attack Maternal Uncle     Age 33 87 Coronary artery disease Maternal Grandfather   . Heart attack Maternal Grandfather     Died in 70s17s Throat cancer Maternal Grandfather   . Hypertension Mother   . Colon cancer Neg Hx   . Esophageal cancer Neg Hx   . Rectal cancer Neg Hx   . Stomach cancer Neg Hx    Social History:  reports that he has never smoked. He has never used smokeless tobacco. He reports that he does not drink alcohol or use illicit drugs.  Allergies:  Allergies  Allergen Reactions  . Lisinopril Other (See Comments)    REACTION: cough with this and/or benazepril    Prior to Admission medications   Medication Sig Start Date End Date Taking? Authorizing Provider  amLODipine (NORVASC) 10 MG  tablet Take 1 tablet (10 mg total) by mouth daily. 05/28/15  Yes Venia Carbon, MD  aspirin 81 MG tablet Take 1 tablet (81 mg total) by mouth daily. 12/18/12  Yes Rhonda G Barrett, PA-C  atorvastatin (LIPITOR) 80 MG tablet Take 80 mg by mouth daily at 6 PM.   Yes Historical Provider, MD  colchicine 0.6 MG tablet Take 0.6 mg by mouth 2 (two) times daily as needed (gout flare).   Yes Historical Provider, MD  losartan (COZAAR) 100 MG tablet Take 1 tablet (100 mg total) by mouth daily. 04/05/15  Yes Wellington Hampshire, MD  nitroGLYCERIN (NITROSTAT) 0.4 MG SL tablet Place 1 tablet (0.4 mg total) under the tongue every 5 (five)  minutes as needed for chest pain. Patient not taking: Reported on 03/10/2016 12/18/12   Lonn Georgia, PA-C     Results for orders placed or performed during the hospital encounter of 03/09/16 (from the past 48 hour(s))  Basic metabolic panel     Status: Abnormal   Collection Time: 03/09/16 10:45 PM  Result Value Ref Range   Sodium 139 135 - 145 mmol/L   Potassium 3.5 3.5 - 5.1 mmol/L   Chloride 107 101 - 111 mmol/L   CO2 23 22 - 32 mmol/L   Glucose, Bld 172 (H) 65 - 99 mg/dL   BUN 19 6 - 20 mg/dL   Creatinine, Ser 1.19 0.61 - 1.24 mg/dL   Calcium 9.2 8.9 - 10.3 mg/dL   GFR calc non Af Amer >60 >60 mL/min   GFR calc Af Amer >60 >60 mL/min    Comment: (NOTE) The eGFR has been calculated using the CKD EPI equation. This calculation has not been validated in all clinical situations. eGFR's persistently <60 mL/min signify possible Chronic Kidney Disease.    Anion gap 9 5 - 15  CBC     Status: Abnormal   Collection Time: 03/09/16 10:45 PM  Result Value Ref Range   WBC 13.0 (H) 3.8 - 10.6 K/uL   RBC 5.06 4.40 - 5.90 MIL/uL   Hemoglobin 14.4 13.0 - 18.0 g/dL   HCT 43.0 40.0 - 52.0 %   MCV 85.0 80.0 - 100.0 fL   MCH 28.5 26.0 - 34.0 pg   MCHC 33.6 32.0 - 36.0 g/dL   RDW 14.1 11.5 - 14.5 %   Platelets 207 150 - 440 K/uL  Troponin I     Status: None   Collection Time: 03/09/16 10:45 PM  Result Value Ref Range   Troponin I <0.03 <0.031 ng/mL    Comment:        NO INDICATION OF MYOCARDIAL INJURY.   Ethanol     Status: None   Collection Time: 03/09/16 10:45 PM  Result Value Ref Range   Alcohol, Ethyl (B) <5 <5 mg/dL    Comment:        LOWEST DETECTABLE LIMIT FOR SERUM ALCOHOL IS 5 mg/dL FOR MEDICAL PURPOSES ONLY   Hepatic function panel     Status: None   Collection Time: 03/09/16 10:45 PM  Result Value Ref Range   Total Protein 7.6 6.5 - 8.1 g/dL   Albumin 4.1 3.5 - 5.0 g/dL   AST 25 15 - 41 U/L   ALT 23 17 - 63 U/L   Alkaline Phosphatase 54 38 - 126 U/L   Total  Bilirubin 0.9 0.3 - 1.2 mg/dL   Bilirubin, Direct 0.2 0.1 - 0.5 mg/dL   Indirect Bilirubin 0.7 0.3 - 0.9 mg/dL  Lipase, blood     Status: None   Collection Time: 03/09/16 10:45 PM  Result Value Ref Range   Lipase 30 11 - 51 U/L   Dg Chest Port 1 View  03/09/2016  CLINICAL DATA:  Right anterior chest pain radiating through to the back. Dyspnea. EXAM: PORTABLE CHEST 1 VIEW COMPARISON:  11/04/2015 FINDINGS: A single AP portable view of the chest demonstrates no focal airspace consolidation or alveolar edema. The lungs are grossly clear. There is no large effusion or pneumothorax. Cardiac and mediastinal contours appear unremarkable. IMPRESSION: No active disease. Electronically Signed   By: Andreas Newport M.D.   On: 03/09/2016 23:44   US Abdomen Limited Ruq  03/10/2016  CLINICAL DATA:  Epigastric pain radiating to the back for 5 hours. Leukocytosis. EXAM: US ABDOMEN LIMITED - RIGHT UPPER QUADRANT COMPARISON:  None. FINDINGS: Gallbladder: No gallstones or wall thickening visualized. No sonographic Murphy sign noted by sonographer. Common bile duct: Diameter: 4.1 mm Liver: No focal lesion identified. Within normal limits in parenchymal echogenicity. IMPRESSION: Normal liver, gallbladder and bile ducts. Electronically Signed   By: Andreas Newport M.D.   On: 03/10/2016 01:29    Review of Systems  Constitutional: Negative for fever and chills.  HENT: Negative for sore throat and tinnitus.   Eyes: Negative for blurred vision and redness.  Respiratory: Negative for cough and shortness of breath.   Cardiovascular: Negative for chest pain, palpitations, orthopnea and PND.  Gastrointestinal: Positive for nausea, vomiting and abdominal pain. Negative for diarrhea.  Genitourinary: Negative for dysuria, urgency and frequency.  Musculoskeletal: Negative for myalgias and joint pain.  Skin: Negative for rash.       No lesions  Neurological: Negative for speech change, focal weakness and weakness.   Endo/Heme/Allergies: Does not bruise/bleed easily.       No temperature intolerance  Psychiatric/Behavioral: Negative for depression and suicidal ideas.    Blood pressure 112/70, pulse 54, resp. rate 12, SpO2 95 %. Physical Exam  Nursing note and vitals reviewed. Constitutional: He is oriented to person, place, and time. He appears well-developed and well-nourished. No distress.  HENT:  Head: Normocephalic and atraumatic.  Mouth/Throat: Oropharynx is clear and moist.  Eyes: Conjunctivae and EOM are normal. Pupils are equal, round, and reactive to light. No scleral icterus.  Neck: Normal range of motion. Neck supple. No JVD present. No tracheal deviation present. No thyromegaly present.  Cardiovascular: Normal rate, regular rhythm and normal heart sounds.  Exam reveals no gallop and no friction rub.   No murmur heard. Respiratory: Effort normal and breath sounds normal. No respiratory distress.  GI: Soft. Bowel sounds are normal. He exhibits no distension. There is no tenderness.  Genitourinary:  Deferred  Musculoskeletal: Normal range of motion. He exhibits no edema.  Lymphadenopathy:    He has no cervical adenopathy.  Neurological: He is alert and oriented to person, place, and time. No cranial nerve deficit.  Skin: Skin is warm and dry. No rash noted. No erythema.  Psychiatric: He has a normal mood and affect. His behavior is normal. Judgment and thought content normal.     Assessment/Plan This is a 60 year old male admitted for intractable abdominal pain and nausea. 1. Abdominal pain: The pain is easing off with IV analgesics. Continue manage pain. The patient is nothing by mouth except for meds. The surgery service will evaluate the patient in the morning. No leukocytosis or signs or symptoms of sepsis. 2. Nausea and vomiting: Controlled with antiemetics at this time. Hydrate with  intravenous fluid. 3. Coronary artery disease: Stable. History of NSTEMI. Cardiology consulted at  the request of the surgery service. 4. Essential hypertension: Controlled; continue amlodipine and ARB 5. Gout: Controlled. Continue colchicine 6. DVT prophylaxis: Heparin 7. GI prophylaxis: None The patient is a full code. Time spent on admission orders and patient care approximately 45 minutes  Harrie Foreman, MD 03/10/2016, 4:52 AM

## 2016-03-11 ENCOUNTER — Encounter: Payer: Self-pay | Admitting: General Surgery

## 2016-03-11 ENCOUNTER — Other Ambulatory Visit: Payer: Self-pay | Admitting: General Surgery

## 2016-03-11 DIAGNOSIS — R1011 Right upper quadrant pain: Secondary | ICD-10-CM

## 2016-03-11 DIAGNOSIS — K811 Chronic cholecystitis: Secondary | ICD-10-CM

## 2016-03-11 NOTE — Patient Instructions (Signed)
Patient has been scheduled for a Lap Chole to be done outpatient at [facility] on 03/13/16.  Preadmit will be by phone on 03/12/16 in the afternoon.  Patient was instructed to call for start time on 03/12/16 between the hours of 1-3 pm.

## 2016-03-12 ENCOUNTER — Encounter: Payer: Self-pay | Admitting: *Deleted

## 2016-03-12 ENCOUNTER — Other Ambulatory Visit: Payer: Self-pay

## 2016-03-12 NOTE — Discharge Summary (Signed)
[place] [facility] [place] NAME: Matthew Doyle    MR#:  [id number]  DATE OF BIRTH:  30-May-1956  DATE OF ADMISSION:  03/09/2016 ADMITTING PHYSICIAN: Harrie Foreman, MD  DATE OF DISCHARGE: 03/10/2016  8:24 PM  PRIMARY CARE PHYSICIAN: Viviana Simpler, MD   ADMISSION DIAGNOSIS:  RUQ abdominal pain [R10.11] Non-intractable vomiting with nausea, vomiting of unspecified type [R11.2]  DISCHARGE DIAGNOSIS:  Active Problems:   Intractable abdominal pain   Preop cardiovascular exam   SECONDARY DIAGNOSIS:   Past Medical History  Diagnosis Date  . Hypertension   . Hyperlipidemia   . Hemorrhoids   . Colon polyps   . Diverticulosis   . Gout   . Internal hemorrhoids without mention of complication   . CAD (coronary artery disease)     a. 09/2003 Cath/PCI: LAD 80%->3.0x23 Cypher DES;  b. 03/2004 Cath: LM nl, LAD 20p, 58m, patent stent, D1 25, D2 small, 80/jailed, LCX 37m, OM1 nl, OM2 20, RCA 40-50p, PDA 70m;  c. Non-ST elevation MI in January of 2014. Cardiac cath showed patent LAD stent, included distal right PDA supplying a small territory, 99% proximal OM 2 status post PCI and DES placement   . Arthritis     knee  . Myocardial infarction ([place])     LAST 12/16/12  . Dysrhythmia      ADMITTING HISTORY  Chief Complaint: Abdominal pain HPI: The patient with past medical history of diverticulosis and coronary artery disease presents to the emergency department complaining of abdominal pain and 1 episode of nonbloody nonbilious emesis. The patient has had approximately 6 months of intermittent right upper quadrant pain. He underwent a HIDA scan recently which showed a 9% ejection fraction. He has been seen by Dr. Jamal Collin and has discussed elective cholecystectomy. Tonight the patient had meatloaf for supper and developed excruciating abdominal pain and the effort mentioned episode of nausea and vomiting. In the emergency department right upper  quadrant ultrasound showed no evidence of structural. Emergency department staff discussed the patient's case with Dr. Rochel Brome who recommended evaluation in the hospitalist service was called for medical management.  HOSPITAL COURSE:   Patient was admitted onto medical floor for pain control. He was started on IV and oral pain medications. Initially was nothing by mouth and later started on a diet. Pain improved in spite of eating. Was seen by Dr. Jamal Collin of surgery who has scheduled patient for outpatient cholecystectomy on Thursday. Patient was seen by cardiology Dr. Fletcher Anon with whom I have discussed and patient has been cleared for his cholecystectomy.  Patient was discharged home with prescriptions for pain medications. Ultrasound of the right upper quadrant did not show any acute cholecystitis. No fever.  CONSULTS OBTAINED:  Treatment Team:  Wellington Hampshire, MD Seeplaputhur Robinette Haines, MD  DRUG ALLERGIES:   Allergies  Allergen Reactions  . Lisinopril Other (See Comments)    REACTION: cough with this and/or benazepril    DISCHARGE MEDICATIONS:   Discharge Medication List as of 03/10/2016  7:11 PM    START taking these medications   Details  oxyCODONE-acetaminophen (PERCOCET) 10-325 MG tablet Take 1 tablet by mouth every 8 (eight) hours as needed for pain., Starting 03/10/2016, Until Discontinued, Print      CONTINUE these medications which have NOT CHANGED   Details  amLODipine (NORVASC) 10 MG tablet Take 1 tablet (10 mg total) by mouth daily., Starting 05/28/2015, Until Discontinued, Normal    aspirin 81 MG tablet  Take 1 tablet (81 mg total) by mouth daily., Starting 12/18/2012, Until Discontinued, OTC    atorvastatin (LIPITOR) 80 MG tablet Take 80 mg by mouth daily at 6 PM., Until Discontinued, Historical Med    colchicine 0.6 MG tablet Take 0.6 mg by mouth 2 (two) times daily as needed (gout flare)., Until Discontinued, Historical Med    losartan (COZAAR) 100 MG tablet  Take 1 tablet (100 mg total) by mouth daily., Starting 04/05/2015, Until Discontinued, Normal    nitroGLYCERIN (NITROSTAT) 0.4 MG SL tablet Place 1 tablet (0.4 mg total) under the tongue every 5 (five) minutes as needed for chest pain., Starting 12/18/2012, Until Discontinued, Normal        Today   VITAL SIGNS:  Blood pressure 131/81, pulse 60, temperature 98.7 F (37.1 C), temperature source Oral, resp. rate 18, height 6' (1.829 m), weight 109.374 kg (241 lb 2 oz), SpO2 98 %.  I/O:  No intake or output data in the 24 hours ending 03/12/16 1432  PHYSICAL EXAMINATION:  Physical Exam  GENERAL:  60 y.o.-year-old patient lying in the bed with no acute distress.  LUNGS: Normal breath sounds bilaterally, no wheezing, rales,rhonchi or crepitation. No use of accessory muscles of respiration.  CARDIOVASCULAR: S1, S2 normal. No murmurs, rubs, or gallops.  ABDOMEN: Soft, non-distended. Bowel sounds present. No organomegaly or mass. Right upper quadrant tenderness NEUROLOGIC: Moves all 4 extremities. PSYCHIATRIC: The patient is alert and oriented x 3.  SKIN: No obvious rash, lesion, or ulcer.   DATA REVIEW:   CBC  Recent Labs Lab 03/09/16 2245  WBC 13.0*  HGB 14.4  HCT 43.0  PLT 207   Chemistries   Recent Labs Lab 03/09/16 2245  NA 139  K 3.5  CL 107  CO2 23  GLUCOSE 172*  BUN 19  CREATININE 1.19  CALCIUM 9.2  AST 25  ALT 23  ALKPHOS 54  BILITOT 0.9   Cardiac Enzymes  Recent Labs Lab 03/09/16 [place] <0.03   Microbiology Results  No results found for this or any previous visit.  RADIOLOGY:  No results found.  Follow up with PCP in 1 week.  Management plans discussed with the patient, family and they are in agreement.  CODE STATUS:  Code Status History    Date Active Date Inactive Code Status Order ID Comments User Context   03/10/2016  5:34 AM 03/10/2016 11:24 PM Full Code [id number]  Harrie Foreman, MD Inpatient   12/17/2012  4:15 AM 12/17/2012   7:43 PM Full Code [id number]  Hoyle Barr, RN Inpatient     TOTAL TIME TAKING CARE OF THIS PATIENT ON DAY OF DISCHARGE: more than 30 minutes.   Hillary Bow R M.D on 03/12/2016 at 2:32 PM  Between 7am to 6pm - Pager - [phone]  After 6pm go to [email or web address] - password [email or web address] [facility]  Office  [phone]  CC: Primary care physician; Viviana Simpler, MD  Note: This dictation was prepared with Dragon dictation along with smaller phrase technology. Any transcriptional errors that result from this process are unintentional.

## 2016-03-12 NOTE — Patient Instructions (Signed)
  Your procedure is scheduled on: 03/13/16 Report to Day Surgery.[facility] SECOND FLOOR To find out your arrival time please call [phone] between 1PM - 3PM on4/12/17  Remember: Instructions that are not followed completely may result in serious medical risk, up to and including death, or upon the discretion of your surgeon and anesthesiologist your surgery may need to be rescheduled.    __X__ 1. Do not eat food or drink liquids after midnight. No gum chewing or hard candies.     __X__ 2. No Alcohol for 24 hours before or after surgery.   ____ 3. Bring all medications with you on the day of surgery if instructed.    __X__ 4. Notify your doctor if there is any change in your medical condition     (cold, fever, infections).     Do not wear jewelry, make-up, hairpins, clips or nail polish.  Do not wear lotions, powders, or perfumes. You may wear deodorant.  Do not shave 48 hours prior to surgery. Men may shave face and neck.  Do not bring valuables to the hospital.    [facility] is not responsible for any belongings or valuables.               Contacts, dentures or bridgework may not be worn into surgery.  Leave your suitcase in the car. After surgery it may be brought to your room.  For patients admitted to the hospital, discharge time is determined by your                treatment team.   Patients discharged the day of surgery will not be allowed to drive home.   Please read over the following fact sheets that you were given:   Surgical Site Infection Prevention   ____ Take these medicines the morning of surgery with A SIP OF WATER:    1. AMLODIPINE  2. COZAAR  3.   4.  5.  6.  ____ Fleet Enema (as directed)   ____ Use CHG Soap as directed  ____ Use inhalers on the day of surgery  ____ Stop metformin 2 days prior to surgery    ____ Take 1/2 of usual insulin dose the night before surgery and none on the morning of surgery.   ____ Stop Coumadin/Plavix/aspirin  on  ____ Stop Anti-inflammatories on    ____ Stop supplements until after surgery.    ____ Bring C-Pap to the hospital.

## 2016-03-13 ENCOUNTER — Ambulatory Visit: Payer: BC Managed Care – PPO | Admitting: Anesthesiology

## 2016-03-13 ENCOUNTER — Ambulatory Visit: Payer: BC Managed Care – PPO

## 2016-03-13 ENCOUNTER — Encounter
Admission: RE | Disposition: A | Discharge status: Home / Self Care | Payer: Self-pay | Source: Ambulatory Visit | Attending: General Surgery

## 2016-03-13 ENCOUNTER — Ambulatory Visit
Admission: RE | Admit: 2016-03-13 | Discharge: 2016-03-13 | Disposition: A | Discharge status: Home / Self Care | Payer: BC Managed Care – PPO | Source: Ambulatory Visit | Attending: General Surgery | Admitting: General Surgery

## 2016-03-13 DIAGNOSIS — E785 Hyperlipidemia, unspecified: Secondary | ICD-10-CM | POA: Insufficient documentation

## 2016-03-13 DIAGNOSIS — Z955 Presence of coronary angioplasty implant and graft: Secondary | ICD-10-CM | POA: Insufficient documentation

## 2016-03-13 DIAGNOSIS — K811 Chronic cholecystitis: Secondary | ICD-10-CM | POA: Diagnosis not present

## 2016-03-13 DIAGNOSIS — K5790 Diverticulosis of intestine, part unspecified, without perforation or abscess without bleeding: Secondary | ICD-10-CM | POA: Diagnosis not present

## 2016-03-13 DIAGNOSIS — Z800 Family history of malignant neoplasm of digestive organs: Secondary | ICD-10-CM | POA: Diagnosis not present

## 2016-03-13 DIAGNOSIS — Z79899 Other long term (current) drug therapy: Secondary | ICD-10-CM | POA: Diagnosis not present

## 2016-03-13 DIAGNOSIS — Z86010 Personal history of colonic polyps: Secondary | ICD-10-CM | POA: Insufficient documentation

## 2016-03-13 DIAGNOSIS — Z888 Allergy status to other drugs, medicaments and biological substances status: Secondary | ICD-10-CM | POA: Insufficient documentation

## 2016-03-13 DIAGNOSIS — M109 Gout, unspecified: Secondary | ICD-10-CM | POA: Diagnosis not present

## 2016-03-13 DIAGNOSIS — K648 Other hemorrhoids: Secondary | ICD-10-CM | POA: Diagnosis not present

## 2016-03-13 DIAGNOSIS — Z833 Family history of diabetes mellitus: Secondary | ICD-10-CM | POA: Diagnosis not present

## 2016-03-13 DIAGNOSIS — I10 Essential (primary) hypertension: Secondary | ICD-10-CM | POA: Diagnosis not present

## 2016-03-13 DIAGNOSIS — Z7982 Long term (current) use of aspirin: Secondary | ICD-10-CM | POA: Insufficient documentation

## 2016-03-13 DIAGNOSIS — I252 Old myocardial infarction: Secondary | ICD-10-CM | POA: Diagnosis not present

## 2016-03-13 DIAGNOSIS — Z419 Encounter for procedure for purposes other than remedying health state, unspecified: Secondary | ICD-10-CM

## 2016-03-13 DIAGNOSIS — M1990 Unspecified osteoarthritis, unspecified site: Secondary | ICD-10-CM | POA: Diagnosis not present

## 2016-03-13 DIAGNOSIS — I2510 Atherosclerotic heart disease of native coronary artery without angina pectoris: Secondary | ICD-10-CM | POA: Insufficient documentation

## 2016-03-13 DIAGNOSIS — R1011 Right upper quadrant pain: Secondary | ICD-10-CM | POA: Insufficient documentation

## 2016-03-13 DIAGNOSIS — Z8249 Family history of ischemic heart disease and other diseases of the circulatory system: Secondary | ICD-10-CM | POA: Diagnosis not present

## 2016-03-13 HISTORY — DX: Acute myocardial infarction, unspecified: I21.9

## 2016-03-13 HISTORY — PX: CHOLECYSTECTOMY: SHX55

## 2016-03-13 HISTORY — DX: Cardiac arrhythmia, unspecified: I49.9

## 2016-03-13 HISTORY — PX: CHOLECYSTECTOMY, LAPAROSCOPIC: SHX56

## 2016-03-13 SURGERY — LAPAROSCOPIC CHOLECYSTECTOMY WITH INTRAOPERATIVE CHOLANGIOGRAM
Anesthesia: General | Wound class: Clean Contaminated

## 2016-03-13 MED ORDER — OXYCODONE-ACETAMINOPHEN 5-325 MG PO TABS
ORAL_TABLET | ORAL | Status: AC
  Filled 2016-03-13: qty 1

## 2016-03-13 MED ORDER — GLYCOPYRROLATE 0.2 MG/ML IJ SOLN
INTRAMUSCULAR | Status: DC | PRN
  Administered 2016-03-13: 0.6 mg via INTRAVENOUS

## 2016-03-13 MED ORDER — LACTATED RINGERS IV SOLN
INTRAVENOUS | Status: DC
  Administered 2016-03-13 (×2): via INTRAVENOUS

## 2016-03-13 MED ORDER — FENTANYL CITRATE (PF) 100 MCG/2ML IJ SOLN
25.0000 ug | INTRAMUSCULAR | Status: DC | PRN
  Administered 2016-03-13 (×3): 50 ug via INTRAVENOUS

## 2016-03-13 MED ORDER — SODIUM CHLORIDE 0.9 % IV SOLN
INTRAVENOUS | Status: DC | PRN
  Administered 2016-03-13: 10 mL

## 2016-03-13 MED ORDER — ONDANSETRON HCL 4 MG/2ML IJ SOLN
INTRAMUSCULAR | Status: DC | PRN
  Administered 2016-03-13: 4 mg via INTRAVENOUS

## 2016-03-13 MED ORDER — MIDAZOLAM HCL 2 MG/2ML IJ SOLN
INTRAMUSCULAR | Status: DC | PRN
  Administered 2016-03-13: 2 mg via INTRAVENOUS

## 2016-03-13 MED ORDER — FAMOTIDINE 20 MG PO TABS
ORAL_TABLET | ORAL | Status: AC
  Administered 2016-03-13: 20 mg via ORAL
  Filled 2016-03-13: qty 1

## 2016-03-13 MED ORDER — SODIUM CHLORIDE 0.9 % IJ SOLN
INTRAMUSCULAR | Status: AC
  Filled 2016-03-13: qty 50

## 2016-03-13 MED ORDER — FAMOTIDINE 20 MG PO TABS
20.0000 mg | ORAL_TABLET | Freq: Once | ORAL | Status: AC
  Administered 2016-03-13: 20 mg via ORAL

## 2016-03-13 MED ORDER — FENTANYL CITRATE (PF) 100 MCG/2ML IJ SOLN
INTRAMUSCULAR | Status: DC | PRN
Start: 2016-03-13 — End: 2016-03-13
  Administered 2016-03-13: 50 ug via INTRAVENOUS
  Administered 2016-03-13: 100 ug via INTRAVENOUS
  Administered 2016-03-13: 50 ug via INTRAVENOUS

## 2016-03-13 MED ORDER — FENTANYL CITRATE (PF) 100 MCG/2ML IJ SOLN
INTRAMUSCULAR | Status: AC
  Administered 2016-03-13: 50 ug via INTRAVENOUS
  Filled 2016-03-13: qty 2

## 2016-03-13 MED ORDER — ROCURONIUM BROMIDE 100 MG/10ML IV SOLN
INTRAVENOUS | Status: DC | PRN
  Administered 2016-03-13: 40 mg via INTRAVENOUS
  Administered 2016-03-13: 10 mg via INTRAVENOUS

## 2016-03-13 MED ORDER — HYDRALAZINE HCL 20 MG/ML IJ SOLN
INTRAMUSCULAR | Status: DC | PRN
  Administered 2016-03-13: 5 mg via INTRAVENOUS

## 2016-03-13 MED ORDER — SUCCINYLCHOLINE CHLORIDE 20 MG/ML IJ SOLN
INTRAMUSCULAR | Status: DC | PRN
  Administered 2016-03-13: 120 mg via INTRAVENOUS

## 2016-03-13 MED ORDER — CEFAZOLIN SODIUM-DEXTROSE 2-4 GM/100ML-% IV SOLN
2.0000 g | INTRAVENOUS | Status: AC
  Administered 2016-03-13: 2 g via INTRAVENOUS

## 2016-03-13 MED ORDER — CEFAZOLIN SODIUM-DEXTROSE 2-4 GM/100ML-% IV SOLN
INTRAVENOUS | Status: AC
Start: 2016-03-13 — End: 2016-03-13
  Administered 2016-03-13: 2 g via INTRAVENOUS
  Filled 2016-03-13: qty 100

## 2016-03-13 MED ORDER — PROPOFOL 10 MG/ML IV BOLUS
INTRAVENOUS | Status: DC | PRN
  Administered 2016-03-13: 180 mg via INTRAVENOUS

## 2016-03-13 MED ORDER — ONDANSETRON HCL 4 MG/2ML IJ SOLN: 4.0000 mg | Freq: Once | INTRAMUSCULAR | Status: DC | PRN

## 2016-03-13 MED ORDER — OXYCODONE-ACETAMINOPHEN 5-325 MG PO TABS
1.0000 | ORAL_TABLET | Freq: Four times a day (QID) | ORAL | Status: DC | PRN
  Administered 2016-03-13: 1 via ORAL

## 2016-03-13 MED ORDER — CHLORHEXIDINE GLUCONATE 4 % EX LIQD: 1.0000 "application " | Freq: Once | CUTANEOUS | Status: DC

## 2016-03-13 MED ORDER — NEOSTIGMINE METHYLSULFATE 10 MG/10ML IV SOLN
INTRAVENOUS | Status: DC | PRN
  Administered 2016-03-13: 3 mg via INTRAVENOUS

## 2016-03-13 SURGICAL SUPPLY — 40 items
AGENT HMST MTR 8 SURGIFLO (HEMOSTASIS)
ANCHOR TIS RET SYS 235ML (MISCELLANEOUS) ×2 IMPLANT
APPLICATOR SURGIFLO (MISCELLANEOUS) IMPLANT
APPLIER CLIP LOGIC TI 5 (MISCELLANEOUS) ×2 IMPLANT
APR CLP MED LRG 33X5 (MISCELLANEOUS) ×1
BAG TISS RTRVL C235 10X14 (MISCELLANEOUS) ×1
BLADE SURG 11 STRL SS SAFETY (MISCELLANEOUS) ×2 IMPLANT
CANISTER SUCT 1200ML W/VALVE (MISCELLANEOUS) ×2 IMPLANT
CANNULA DILATOR 10 W/SLV (CANNULA) ×2 IMPLANT
CATH CHOLANG 76X19 KUMAR (CATHETERS) ×2 IMPLANT
CHLORAPREP W/TINT 26ML (MISCELLANEOUS) ×2 IMPLANT
DEFOGGER SCOPE WARMER CLEARIFY (MISCELLANEOUS) ×2 IMPLANT
DRAPE C-ARM XRAY 36X54 (DRAPES) ×2 IMPLANT
DRAPE INCISE IOBAN 66X45 STRL (DRAPES) ×2 IMPLANT
DRESSING TELFA 4X3 1S ST N-ADH (GAUZE/BANDAGES/DRESSINGS) ×2 IMPLANT
DRSG TEGADERM 2-3/8X2-3/4 SM (GAUZE/BANDAGES/DRESSINGS) ×8 IMPLANT
ELECT REM PT RETURN 9FT ADLT (ELECTROSURGICAL) ×2
ELECTRODE REM PT RTRN 9FT ADLT (ELECTROSURGICAL) ×1 IMPLANT
GLOVE BIO SURGEON STRL SZ7 (GLOVE) ×2 IMPLANT
GOWN STRL REUS W/ TWL LRG LVL3 (GOWN DISPOSABLE) ×3 IMPLANT
GOWN STRL REUS W/TWL LRG LVL3 (GOWN DISPOSABLE) ×6
GRASPER SUT TROCAR 14GX15 (MISCELLANEOUS) ×2 IMPLANT
HEMOSTAT SURGICEL 2X3 (HEMOSTASIS) IMPLANT
IRRIGATION STRYKERFLOW (MISCELLANEOUS) ×1 IMPLANT
IRRIGATOR STRYKERFLOW (MISCELLANEOUS) ×2
IV LACTATED RINGERS 1000ML (IV SOLUTION) ×2 IMPLANT
KIT RM TURNOVER STRD PROC AR (KITS) ×2 IMPLANT
LABEL OR SOLS (LABEL) ×2 IMPLANT
NDL INSUFF ACCESS 14 VERSASTEP (NEEDLE) ×2 IMPLANT
PACK LAP CHOLECYSTECTOMY (MISCELLANEOUS) ×2 IMPLANT
SCISSORS METZENBAUM CVD 33 (INSTRUMENTS) ×2 IMPLANT
SLEEVE ENDOPATH XCEL 5M (ENDOMECHANICALS) ×4 IMPLANT
SPOGE SURGIFLO 8M (HEMOSTASIS)
SPONGE SURGIFLO 8M (HEMOSTASIS) IMPLANT
STRIP CLOSURE SKIN 1/2X4 (GAUZE/BANDAGES/DRESSINGS) ×2 IMPLANT
SUT VIC AB 0 SH 27 (SUTURE) ×2 IMPLANT
SUT VIC AB 4-0 FS2 27 (SUTURE) ×2 IMPLANT
SWABSTK COMLB BENZOIN TINCTURE (MISCELLANEOUS) ×2 IMPLANT
TROCAR XCEL NON-BLD 5MMX100MML (ENDOMECHANICALS) ×2 IMPLANT
TUBING INSUFFLATOR HI FLOW (MISCELLANEOUS) ×2 IMPLANT

## 2016-03-13 NOTE — Interval H&P Note (Signed)
History and Physical Interval Note:  03/13/2016 10:38 AM  Matthew Doyle  has presented today for surgery, with the diagnosis of ABDOMINAL PAIN  The various methods of treatment have been discussed with the patient and family. After consideration of risks, benefits and other options for treatment, the patient has consented to  Procedure(s): LAPAROSCOPIC CHOLECYSTECTOMY WITH INTRAOPERATIVE CHOLANGIOGRAM (N/A) as a surgical intervention .  The patient's history has been reviewed, patient examined, no change in status, stable for surgery.  I have reviewed the patient's chart and labs.  Questions were answered to the patient's satisfaction.     SANKAR,SEEPLAPUTHUR G

## 2016-03-13 NOTE — Anesthesia Procedure Notes (Signed)
Procedure Name: Intubation Date/Time: 03/13/2016 11:08 AM Performed by: Nelda Marseille Pre-anesthesia Checklist: Patient identified, Patient being monitored, Timeout performed, Emergency Drugs available and Suction available Patient Re-evaluated:Patient Re-evaluated prior to inductionOxygen Delivery Method: Circle system utilized Preoxygenation: Pre-oxygenation with 100% oxygen Intubation Type: IV induction Ventilation: Mask ventilation without difficulty Laryngoscope Size: Mac and 3 Grade View: Grade I Tube type: Oral Tube size: 7.5 mm Number of attempts: 1 Airway Equipment and Method: Stylet Placement Confirmation: ETT inserted through vocal cords under direct vision,  positive ETCO2 and breath sounds checked- equal and bilateral Secured at: 21 cm Tube secured with: Tape Dental Injury: Teeth and Oropharynx as per pre-operative assessment

## 2016-03-13 NOTE — Interval H&P Note (Signed)
History and Physical Interval Note:  03/13/2016 10:37 AM  Matthew Doyle  has presented today for surgery, with the diagnosis of ABDOMINAL PAIN  The various methods of treatment have been discussed with the patient and family. After consideration of risks, benefits and other options for treatment, the patient has consented to  Procedure(s): LAPAROSCOPIC CHOLECYSTECTOMY WITH INTRAOPERATIVE CHOLANGIOGRAM (N/A) as a surgical intervention .  The patient's history has been reviewed, patient examined, no change in status, stable for surgery.  I have reviewed the patient's chart and labs.  Questions were answered to the patient's satisfaction.     [name]

## 2016-03-13 NOTE — Discharge Instructions (Signed)

## 2016-03-13 NOTE — Anesthesia Preprocedure Evaluation (Signed)
Anesthesia Evaluation  Patient identified by MRN, date of birth, ID band Patient awake    Reviewed: Allergy & Precautions, H&P , NPO status , Patient's Chart, lab work & pertinent test results, reviewed documented beta blocker date and time   History of Anesthesia Complications Negative for: history of anesthetic complications  Airway Mallampati: III  TM Distance: >3 FB Neck ROM: full    Dental no notable dental hx. (+) Missing, Teeth Intact   Pulmonary neg pulmonary ROS,    Pulmonary exam normal breath sounds clear to auscultation       Cardiovascular Exercise Tolerance: Good hypertension, On Medications (-) angina+ CAD, + Past MI and + Cardiac Stents (2 stents, last placed in 2014)  (-) CABG Normal cardiovascular exam+ dysrhythmias (-) Valvular Problems/Murmurs Rhythm:regular Rate:Normal     Neuro/Psych negative neurological ROS  negative psych ROS   GI/Hepatic negative GI ROS, Neg liver ROS,   Endo/Other  negative endocrine ROS  Renal/GU negative Renal ROS  negative genitourinary   Musculoskeletal   Abdominal   Peds  Hematology negative hematology ROS (+)   Anesthesia Other Findings Past Medical History:   Hypertension                                                 Hyperlipidemia                                               Hemorrhoids                                                  Colon polyps                                                 Diverticulosis                                               Gout                                                         Internal hemorrhoids without mention of compli*              CAD (coronary artery disease)                                  Comment:a. 09/2003 Cath/PCI: LAD 80%->3.0x23 Cypher               DES;  b. 03/2004 Cath: LM nl, LAD 20p, 54m,               patent stent, D1 25,  D2 small, 80/jailed, LCX               57m, OM1 nl, OM2 20, RCA 40-50p, PDA 72m;   c.               Non-ST elevation MI in January of 2014. Cardiac              cath showed patent LAD stent, included distal               right PDA supplying a small territory, 99%               proximal OM 2 status post PCI and DES placement   Arthritis                                                      Comment:knee   Myocardial infarction ([place])                                    Comment:LAST 12/16/12   Dysrhythmia                                                  Reproductive/Obstetrics negative OB ROS                             Anesthesia Physical Anesthesia Plan  ASA: III  Anesthesia Plan: General   Post-op Pain Management:    Induction:   Airway Management Planned:   Additional Equipment:   Intra-op Plan:   Post-operative Plan:   Informed Consent: I have reviewed the patients History and Physical, chart, labs and discussed the procedure including the risks, benefits and alternatives for the proposed anesthesia with the patient or authorized representative who has indicated his/her understanding and acceptance.   Dental Advisory Given  Plan Discussed with: Anesthesiologist, CRNA and Surgeon  Anesthesia Plan Comments:         Anesthesia Quick Evaluation

## 2016-03-13 NOTE — H&P (View-Only) (Signed)
This patient was seen in my office approximately a week ago when he presented with the episodes of  right upper quadrant pain occasionally radiating around the side and back and sometimes the epigastric area. It previously undergone ultrasound which was normal HIDA scan was abnormal showing an ejection fraction of 9%. Patient has not described the typical postprandial pain suggestive of gallbladder but the attack of pain and location were highly suggestive of biliary source. He did not notice any particular relationship to food or when he ate. After full discussion it was decided that that he may benefit with the cholecystectomy but there was no guarantee that his pain would be resolved. 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 Also he had an upcoming cardiac appointment next month and I thought it would be prudent to obtain a cardiac clearance given his history of coronary artery disease. Patient presented last night with this severe episode of the pain saying that this was as worse that he has had. He had a mild elevation of his white count of 13,000. Ultrasound was again noted to be normal. Since admission here last night the patient has improved and is now whole lot better but states is still a little sore in the right upper quadrant. Patient has been seen by the cardiology service who felt that he was stable enough to undergo the surgery with low risk. Patient at this time is willing to go ahead with the surgery noting that the episodes of become a little more frequent.    Examination he  seems fairly comfortable at this time. Sclerae are nonicteric, abdomen is soft with very mild subjective tenderness in the right upper quadrant. Murphy's sign is absent. His vital signs have been stable and he has had no fever.  Impression and recommendation:  patient appears to have increasing episodes of the same right upper quadrant pain. He has an abnormal ejection  fraction on HIDA scan from a recent evaluation. As discussed with patient today and before we'll plan to proceed with a laparoscopy cholecystectomy on Thursday, 03/13/2016 as outpatient. Patient can therefore be discharged later this evening or tomorrow morning and my office will make the appropriate scheduling for his surgery.

## 2016-03-13 NOTE — Op Note (Signed)
Preop diagnosis: Chronic cholecystitis  Post op diagnosis: Same  Operation: Laparoscopy cholecystectomy with intraoperative cholangiogram  Surgeon: S.G.Sankar  Assistant:     Anesthesia: Gen.  Complications: None  EBL: Less than 20 mL  Drains: None  Description: Patient was put to sleep in the supine position the operating table. Abdomen was prepped and draped as sterile field and timeout performed. Port incision was made just above the umbilicus and a Veress needle with the InnerDyne sleeve was position in the peritoneal cavity. This was verified of the hanging drop method and pneumoperitoneum was obtained. 10 mm port was then placed the camera was introduced with good visualization of peritoneal cavity epigastric and 2 lateral 5 mm ports were placed. The liver appeared normal gallbladder was noted be mildly thickened in its wall but showed no acute findings or any adhesions. It was retracted cephalad and Hartman's pouch was pulled up. The cystic artery and cystic duct were easily identified. The artery was first cleared, clipped and cut. The cystic duct was freed up and then a Kumar clamp and catheter were positioned. Cholangiogram was completed showing a normal-appearing bile duct both proximal and distal with no obstruction to flow. The catheter was used to decompress the gallbladder and removed and the cystic duct was hemoclipped and cut. Gallbladder dissected free from its bed using cautery for control of bleeding. Small amount of fluid was used to irrigate out the right upper quadrant and suctioned out. The gallbladder was then brought out through the umbilical port site. The fascial opening of the umbilical port side was closed with 0 Vicryl using a suture passer. Pneumoperitoneum was released the remaining ports removed. Skin incision closed with subcuticular 4-0 Vicryl reinforced with Steri-Strips and tincture benzoin. Telfa and Tegaderm dressings were placed and patient subsequently  extubated and returned recovery room stable condition.

## 2016-03-13 NOTE — Transfer of Care (Signed)
Immediate Anesthesia Transfer of Care Note  Patient: Matthew Doyle  Procedure(s) Performed: Procedure(s): LAPAROSCOPIC CHOLECYSTECTOMY WITH INTRAOPERATIVE CHOLANGIOGRAM (N/A)  Patient Location: PACU  Anesthesia Type:General  Level of Consciousness: awake and patient cooperative  Airway & Oxygen Therapy: Patient Spontanous Breathing and Patient connected to nasal cannula oxygen  Post-op Assessment: Report given to RN and Post -op Vital signs reviewed and stable  Post vital signs: Reviewed and stable  Last Vitals:  Filed Vitals:   03/13/16 0952  BP: 159/87  Pulse: 64  Temp: 35.8 C  Resp: 16    Complications: No apparent anesthesia complications

## 2016-03-13 NOTE — Transfer of Care (Signed)
Immediate Anesthesia Transfer of Care Note  Patient: Matthew Doyle  Procedure(s) Performed: Procedure(s): LAPAROSCOPIC CHOLECYSTECTOMY WITH INTRAOPERATIVE CHOLANGIOGRAM (N/A)  Patient Location: PACU  Anesthesia Type:General  Level of Consciousness: awake and alert   Airway & Oxygen Therapy: Patient Spontanous Breathing and Patient connected to face mask oxygen  Post-op Assessment: Report given to RN and Post -op Vital signs reviewed and stable  Post vital signs: stable     Last Vitals:  Filed Vitals:   03/13/16 1236 03/13/16 1239  BP:  125/86  Pulse: 75 70  Temp:    Resp: 16 15    Complications: No apparent anesthesia complications

## 2016-03-13 NOTE — Anesthesia Postprocedure Evaluation (Signed)
Anesthesia Post Note  Patient: Matthew Doyle  Procedure(s) Performed: Procedure(s) (LRB): LAPAROSCOPIC CHOLECYSTECTOMY WITH INTRAOPERATIVE CHOLANGIOGRAM (N/A)  Patient location during evaluation: PACU Anesthesia Type: General Level of consciousness: awake and alert Pain management: pain level controlled Vital Signs Assessment: post-procedure vital signs reviewed and stable Respiratory status: spontaneous breathing, nonlabored ventilation, respiratory function stable and patient connected to nasal cannula oxygen Cardiovascular status: blood pressure returned to baseline and stable Postop Assessment: no signs of nausea or vomiting Anesthetic complications: no    Last Vitals:  Filed Vitals:   03/13/16 1334 03/13/16 1438  BP: 111/68   Pulse: 52 58  Temp: 35.7 C   Resp: 14 14    Last Pain:  Filed Vitals:   03/13/16 1440  PainSc: 3                  Martha Clan

## 2016-03-13 NOTE — H&P (View-Only) (Signed)
Patient ID: Matthew Doyle, male   DOB: 1956/03/18, 60 y.o.   MRN: [id number]  Chief Complaint  Patient presents with  . Other    gall bladder    HPI Matthew Doyle is a 60 y.o. male.  Here today for evaluation of his gall bladder.  He states he has been having abdominal  Pain in his right upper quadrant for about six months now. Pain last for a hour or longer.Usually pain durning the day time, he states feels better when lying down. He states when he had his HIDA scan done on the second injection he felt like there was a stabbing pain in his right uppper quadrant.  CT and ultrasound was 11-03-16 and HIDA scan was 02-28-16.  I have reviewed the history of present illness with the patient.  HPI  Past Medical History  Diagnosis Date  . Hypertension   . Hyperlipidemia   . Atypical chest pain   . Hemorrhoids   . Colon polyps   . Diverticulosis   . Gout   . Internal hemorrhoids without mention of complication   . CAD (coronary artery disease)     a. 09/2003 Cath/PCI: LAD 80%->3.0x23 Cypher DES;  b. 03/2004 Cath: LM nl, LAD 20p, 69m, patent stent, D1 25, D2 small, 80/jailed, LCX 25m, OM1 nl, OM2 20, RCA 40-50p, PDA 47m;  c. Non-ST elevation MI in January of 2014. Cardiac cath showed patent LAD stent, included distal right PDA supplying a small territory, 99% proximal OM 2 status post PCI and DES placement   . Arthritis     knee  . MI (myocardial infarction) ([place])     last one- 12-16-12    Past Surgical History  Procedure Laterality Date  . Cypher stent  10/04    mid LAD- gupta  . Cardiac catheterization  2014    s/p stent  . Left heart catheterization with coronary angiogram N/A 12/17/2012    Procedure: LEFT HEART CATHETERIZATION WITH CORONARY ANGIOGRAM;  Surgeon: Peter M Martinique, MD;  Location: [facility] CATH LAB;  Service: Cardiovascular;  Laterality: N/A;  . Percutaneous coronary stent intervention (pci-s) Right 12/17/2012    Procedure: PERCUTANEOUS CORONARY STENT INTERVENTION (PCI-S);   Surgeon: Peter M Martinique, MD;  Location: [facility] CATH LAB;  Service: Cardiovascular;  Laterality: Right;  . Colonoscopy  10/2013    Family History  Problem Relation Age of Onset  . Diabetes Father   . Coronary artery disease Father     CABG - 5 vessel 15 yr ago  . Hypertension Brother   . Hypertension Brother   . Arrhythmia Brother   . Heart attack Paternal Uncle     Age 35  . Heart attack Maternal Uncle     Age 58  . Coronary artery disease Maternal Grandfather   . Heart attack Maternal Grandfather     Died in 53s  . Throat cancer Maternal Grandfather   . Hypertension Mother   . Colon cancer Neg Hx   . Esophageal cancer Neg Hx   . Rectal cancer Neg Hx   . Stomach cancer Neg Hx     Social History Social History  Substance Use Topics  . Smoking status: Never Smoker   . Smokeless tobacco: Never Used  . Alcohol Use: No    Allergies  Allergen Reactions  . Lisinopril     REACTION: cough with this and/or benazepril    Current Outpatient Prescriptions  Medication Sig Dispense Refill  . amLODipine (NORVASC) 10 MG tablet Take 1  tablet (10 mg total) by mouth daily. 90 tablet 3  . aspirin 81 MG tablet Take 1 tablet (81 mg total) by mouth daily. 30 tablet   . atorvastatin (LIPITOR) 80 MG tablet TAKE ONE TABLET BY MOUTH ONCE DAILY 6PM 30 tablet 5  . COLCRYS 0.6 MG tablet TAKE ONE TABLET BY MOUTH TWICE DAILY AS NEEDED 60 tablet 0  . losartan (COZAAR) 100 MG tablet Take 1 tablet (100 mg total) by mouth daily. 90 tablet 3  . nitroGLYCERIN (NITROSTAT) 0.4 MG SL tablet Place 1 tablet (0.4 mg total) under the tongue every 5 (five) minutes as needed for chest pain. 25 tablet 3   No current facility-administered medications for this visit.    Review of Systems Review of Systems  Constitutional: Negative.   Respiratory: Negative.   Cardiovascular: Negative.   Gastrointestinal: Positive for abdominal pain and constipation.    Blood pressure 134/78, pulse 74, resp. rate 14, height 6'  (1.829 m), weight 250 lb (113.399 kg).  Physical Exam Physical Exam  Constitutional: He is oriented to person, place, and time. He appears well-nourished.  Eyes: Conjunctivae are normal. No scleral icterus.  Neck: Neck supple.  Cardiovascular: Normal rate, regular rhythm and normal heart sounds.   Pulmonary/Chest: Effort normal and breath sounds normal.  Abdominal: Soft. Bowel sounds are normal. He exhibits no mass. There is no hepatomegaly. There is no tenderness.  Lymphadenopathy:    He has no cervical adenopathy.  Neurological: He is alert and oriented to person, place, and time.  Skin: Skin is warm and dry.    Data Reviewed Notes, CT scan and HIDA reviewed Only abnormal study was HID which showed gb EF of 9 %  Assessment    Abdominal pain atypical for gallbladder source- low EF on HIDA.      Plan   Discussed fully with pt. In the abscence of any other findings to explain his pain cholecystectomy can be considered but no guarantee that his pain will be resolved.  Pt is not inclined to have surgery at present- may consider it when school closes for summer.     Patient to return in two months. He is to call if pain worsens, has fever/chills associated with pain.  Dr. Tyrell Antonio office is aware that we are requesting cardiac clearance prior to gallbladder removal. Patient is currently scheduled for an appointment with Dr. Fletcher Anon for 04-04-16.   PCP:  Silvio Pate,  This information has been scribed by Gaspar Cola CMA.   Lakeva Hollon G 03/06/2016, 11:51 AM

## 2016-03-14 LAB — SURGICAL PATHOLOGY

## 2016-03-15 ENCOUNTER — Telehealth: Payer: Self-pay | Admitting: General Surgery

## 2016-03-15 NOTE — Telephone Encounter (Signed)
The patient's wife called the evening of April 14 reporting a temperature 101.7. The patient was 24 hours status post cholecystectomy with intraoperative cholangiograms for a calculus cholecystitis. She was contacted by phone this morning and reports that the last temperature in the evening was 99.5, after some oral Advil and Tylenol. He is still sleeping as of this morning.  Review of the pathology showed subacute cholecystitis. In light of normal cholangiograms and otherwise normal anatomy noted at the time of surgery, and her report last night the had had minimal pain, tolerating his diet well and no difficulty with voiding it's likely the temperature was secondary to removal of the disease gallbladder rather than a more serious process.  She was encouraged to call if there is temperature spikes again which time he'll be brought to the ED for blood cultures and laboratory studies.

## 2016-03-20 ENCOUNTER — Encounter: Payer: Self-pay | Admitting: General Surgery

## 2016-03-20 ENCOUNTER — Ambulatory Visit (INDEPENDENT_AMBULATORY_CARE_PROVIDER_SITE_OTHER): Payer: BC Managed Care – PPO | Admitting: General Surgery

## 2016-03-20 VITALS — BP 122/70 | HR 64 | Resp 16 | Ht 72.0 in | Wt 243.0 lb

## 2016-03-20 DIAGNOSIS — K811 Chronic cholecystitis: Secondary | ICD-10-CM

## 2016-03-20 NOTE — Patient Instructions (Addendum)
The patient is aware to call back for any questions or concerns. Follow up as needed. 

## 2016-03-20 NOTE — Progress Notes (Signed)
This is a 60 year old male here today for his post op gallbladder removal on 03/13/16. Patient states he is doing well. He is currently having minimal to no pain. Bowel movements alternate between constipation and diarrhea. I have reviewed the history of present illness with the patient.  Port sites are clean and healing well. Lungs are clear and abdomen is soft and nondistended.   Follow up as needed. The patient is aware to call back for any questions or concerns.   PCP:  Viviana Simpler   This information has been scribed by Karie Fetch RN, BSN,BC. [employer]

## 2016-03-25 ENCOUNTER — Ambulatory Visit: Payer: BC Managed Care – PPO | Admitting: General Surgery

## 2016-04-04 ENCOUNTER — Ambulatory Visit: Payer: Self-pay | Admitting: Cardiovascular Disease

## 2016-04-14 ENCOUNTER — Telehealth: Payer: Self-pay | Admitting: *Deleted

## 2016-04-14 NOTE — Telephone Encounter (Signed)
Patient got a letter in the mail stating that he needs a return to work note fax to them. He had his gallbladder removed on 03/13/16 by Dr.Sankar. Patient returned to work on 03/31/16. The note needs to state what limitations and that is was okay to return to work. Fax it to [phone] Attention to [place].

## 2016-04-15 NOTE — Telephone Encounter (Signed)
Note faxed as requested

## 2016-04-16 ENCOUNTER — Ambulatory Visit: Payer: BC Managed Care – PPO | Admitting: General Surgery

## 2016-05-01 ENCOUNTER — Encounter: Payer: Self-pay | Admitting: Internal Medicine

## 2016-05-07 ENCOUNTER — Ambulatory Visit (INDEPENDENT_AMBULATORY_CARE_PROVIDER_SITE_OTHER): Payer: BC Managed Care – PPO | Admitting: Family Medicine

## 2016-05-07 ENCOUNTER — Encounter: Payer: Self-pay | Admitting: Family Medicine

## 2016-05-07 ENCOUNTER — Ambulatory Visit (INDEPENDENT_AMBULATORY_CARE_PROVIDER_SITE_OTHER)
Admission: RE | Admit: 2016-05-07 | Discharge: 2016-05-07 | Disposition: A | Discharge status: Home / Self Care | Payer: BC Managed Care – PPO | Source: Ambulatory Visit | Attending: Family Medicine | Admitting: Family Medicine

## 2016-05-07 VITALS — BP 140/80 | HR 60 | Temp 98.5°F | Ht 72.0 in | Wt 242.2 lb

## 2016-05-07 DIAGNOSIS — M25512 Pain in left shoulder: Secondary | ICD-10-CM | POA: Diagnosis not present

## 2016-05-07 DIAGNOSIS — R29898 Other symptoms and signs involving the musculoskeletal system: Secondary | ICD-10-CM | POA: Diagnosis not present

## 2016-05-07 NOTE — Progress Notes (Signed)
Pre visit review using our clinic review tool, if applicable. No additional management support is needed unless otherwise documented below in the visit note. 140 80

## 2016-05-07 NOTE — Progress Notes (Signed)
Dr. Frederico Hamman T. Julyan Gales, MD, [facility] [place], [address] Phone: [phone] Fax: [phone]  05/07/2016  Patient: Matthew Doyle, Matthew Doyle, Matthew Doyle, 60 y.o.  Primary Physician:  Viviana Simpler, MD   Chief Complaint  Patient presents with  . Shoulder Pain    Left   Subjective:   Matthew Doyle is a 60 y.o. very pleasant male patient who presents with the following:  Left shoulder - no pain or stabbing pain and has no pain. Cannot lower his minivan gate. Last few weeks it has been bothering him - really for about a couple of months. He has significant weakness, primarily in abduction.  He is not sure about any kind of specific injury, but this is been worsening for about the last 3 months. He has tried traditional conservative measures including NSAIDs, range of motion, home rehabilitation without much success, and he is actually worsened.  Gallbladder out in 2 months ago.   Past Medical History, Surgical History, Social History, Family History, Problem List, Medications, and Allergies have been reviewed and updated if relevant.  Patient Active Problem List   Diagnosis Date Noted  . Intractable abdominal pain 03/10/2016  . Preop cardiovascular exam   . RUQ abdominal pain 06/26/2015  . Constipation 06/26/2015  . 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/28/2015  . Personal history of colonic polyps 08/30/2013  . CAD (coronary artery disease)   . NSTEMI (non-ST elevated myocardial infarction) ([place]) 12/17/2012  . Routine general medical examination at a health care facility 05/13/2012  . GOUT 12/25/2010  . POLYP, COLON 11/01/2007  . DIVERTICULOSIS, COLON 11/01/2007  . Hyperlipidemia 09/17/2007  . Essential hypertension 09/17/2007    Past Medical History  Diagnosis Date  . Hypertension   . Hyperlipidemia   . Hemorrhoids   . Colon polyps   . Diverticulosis   . Gout   . Internal hemorrhoids without  mention of complication   . CAD (coronary artery disease)     a. 09/2003 Cath/PCI: LAD 80%->3.0x23 Cypher DES;  b. 03/2004 Cath: LM nl, LAD 20p, 38m, patent stent, D1 25, D2 small, 80/jailed, LCX 18m, OM1 nl, OM2 20, RCA 40-50p, PDA 10m;  c. Non-ST elevation MI in January of 2014. Cardiac cath showed patent LAD stent, included distal right PDA supplying a small territory, 99% proximal OM 2 status post PCI and DES placement   . Arthritis     knee  . Myocardial infarction ([place])     LAST 12/16/12  . Dysrhythmia     Past Surgical History  Procedure Laterality Date  . Cypher stent  10/04    mid LAD- gupta  . Cardiac catheterization  2014    s/p stent  . Left heart catheterization with coronary angiogram N/A 12/17/2012    Procedure: LEFT HEART CATHETERIZATION WITH CORONARY ANGIOGRAM;  Surgeon: Peter M Martinique, MD;  Location[facility] CATH LAB;  Service: Cardiovascular;  Laterality: N/A;  . Percutaneous coronary stent intervention (pci-s) Right 12/17/2012    Procedure: PERCUTANEOUS CORONARY STENT INTERVENTION (PCI-S);  Surgeon: Peter M Martinique, MD;  Location: [facility] CATH LAB;  Service: Cardiovascular;  Laterality: Right;  . Colonoscopy  10/2013  . Coronary angioplasty      STENTS  . Cholecystectomy N/A 03/13/2016    Procedure: LAPAROSCOPIC CHOLECYSTECTOMY WITH INTRAOPERATIVE CHOLANGIOGRAM;  Surgeon: Christene Lye, MD;  Location: [facility] ORS;  Service: General;  Laterality: N/A;    Social History   Social History  .  Marital Status: Married    Spouse Name: N/A  . Number of Children: 2  . Years of Education: N/A   Occupational History  .     Marland [employer] [place] History Main Topics  . Smoking status: Never Smoker   . Smokeless tobacco: Never Used  . Alcohol Use: No  . Drug Use: No  . Sexual Activity: Not on file   Other Topics Concern  . Not on file   Social History Narrative   Retired as Social research officer, government for [employer]          Family History    Problem Relation Age of Onset  . Diabetes Father   . Coronary artery disease Father     CABG - 5 vessel 15 yr ago  . Hypertension Brother   . Hypertension Brother   . Arrhythmia Brother   . Heart attack Paternal Uncle     Age 41  . Heart attack Maternal Uncle     Age 15  . Coronary artery disease Maternal Grandfather   . Heart attack Maternal Grandfather     Died in 43s  . Throat cancer Maternal Grandfather   . Hypertension Mother   . Colon cancer Neg Hx   . Esophageal cancer Neg Hx   . Rectal cancer Neg Hx   . Stomach cancer Neg Hx     Allergies  Allergen Reactions  . Lisinopril Other (See Comments)    REACTION: cough with this and/or benazepril    Medication list reviewed and updated in full in [place].  GEN: No fevers, chills. Nontoxic. Primarily MSK c/o today. MSK: Detailed in the HPI GI: tolerating PO intake without difficulty Neuro: No numbness, parasthesias, or tingling associated. Otherwise the pertinent positives of the ROS are noted above.   Objective:   BP 140/80 mmHg  Pulse 60  Temp(Src) 98.5 F (36.9 C) (Oral)  Ht 6' (1.829 m)  Wt 242 lb 4 oz (109.884 kg)  BMI 32.85 kg/m2   GEN: Well-developed,well-nourished,in no acute distress; alert,appropriate and cooperative throughout examination HEENT: Normocephalic and atraumatic without obvious abnormalities. Ears, externally no deformities PULM: Breathing comfortably in no respiratory distress EXT: No clubbing, cyanosis, or edema PSYCH: Normally interactive. Cooperative during the interview. Pleasant. Friendly and conversant. Not anxious or depressed appearing. Normal, full affect.  Shoulder: L Inspection: No muscle wasting or winging Ecchymosis/edema: neg  AC joint, scapula, clavicle: NT Cervical spine: NT, full ROM Spurling's: neg Abduction: active to 110, passive full, 3+/5 Flexion: full, 5/5 IR, full, lift-off: 5/5 ER at neutral: full, 5/5 AC crossover: neg Neer: pos Hawkins: mild  pos Drop Test: neg Empty Can: pos Supraspinatus insertion: mild-mod T Bicipital groove: NT Speed's: neg Yergason's: neg Sulcus sign: neg Scapular dyskinesis: none C5-T1 intact  Neuro: Sensation intact Grip 5/5   Radiology: Dg Shoulder Left  05/07/2016  CLINICAL DATA:  Left shoulder pain, no known injury, initial encounter EXAM: LEFT SHOULDER - 2+ VIEW COMPARISON:  None. FINDINGS: Degenerative changes of the acromioclavicular joint are noted. No acute fracture or dislocation is seen. No gross soft tissue abnormality is noted. The underlying bony thorax is within normal limits. IMPRESSION: No acute abnormality noted. Electronically Signed   By: Inez Catalina M.D.   On: 05/07/2016 16:23     Assessment and Plan:   Shoulder weakness - Plan: DG Shoulder Left, MR Shoulder Left Wo Contrast  Left shoulder pain - Plan: DG Shoulder Left, MR Shoulder Left Wo Contrast  With  worsening shoulder weakness and pain and failure to improve with 3 months or more of conservative treatment, obtain an MRI of the left shoulder to evaluate integrity of the patient's rotator cuff, particular concern is supraspinatus.  Notable, dramatic change in strength in the plane of abduction compared to baseline.  If full-thickness or partial high-grade rotator cuff tear, this patient is a good operative candidate.  He is working on the [employer] in the mean time.  Follow-up: depending on MRI  Orders Placed This Encounter  Procedures  . DG Shoulder Left  . MR Shoulder Left Wo Contrast    Signed,  Taivon Haroon T. Knoxx Boeding, MD   Patient's Medications  New Prescriptions   No medications on file  Previous Medications   AMLODIPINE (NORVASC) 10 MG TABLET    Take 1 tablet (10 mg total) by mouth daily.   ASPIRIN 81 MG TABLET    Take 1 tablet (81 mg total) by mouth daily.   ATORVASTATIN (LIPITOR) 80 MG TABLET    Take 80 mg by mouth daily at 6 PM.   COLCHICINE 0.6 MG TABLET    Take 0.6 mg by mouth 2 (two) times  daily as needed (gout flare). Reported on 03/13/2016   LOSARTAN (COZAAR) 100 MG TABLET    Take 1 tablet (100 mg total) by mouth daily.   NITROGLYCERIN (NITROSTAT) 0.4 MG SL TABLET    Place 1 tablet (0.4 mg total) under the tongue every 5 (five) minutes as needed for chest pain.  Modified Medications   No medications on file  Discontinued Medications   No medications on file

## 2016-05-12 ENCOUNTER — Ambulatory Visit
Admission: RE | Admit: 2016-05-12 | Discharge: 2016-05-12 | Disposition: A | Discharge status: Home / Self Care | Payer: BC Managed Care – PPO | Source: Ambulatory Visit | Attending: Family Medicine | Admitting: Family Medicine

## 2016-05-12 DIAGNOSIS — R29898 Other symptoms and signs involving the musculoskeletal system: Secondary | ICD-10-CM

## 2016-05-12 DIAGNOSIS — M25512 Pain in left shoulder: Secondary | ICD-10-CM

## 2016-05-14 ENCOUNTER — Telehealth: Payer: Self-pay | Admitting: Family Medicine

## 2016-05-14 ENCOUNTER — Other Ambulatory Visit: Payer: Self-pay | Admitting: Family Medicine

## 2016-05-14 DIAGNOSIS — M19019 Primary osteoarthritis, unspecified shoulder: Secondary | ICD-10-CM

## 2016-05-14 DIAGNOSIS — S43431A Superior glenoid labrum lesion of right shoulder, initial encounter: Secondary | ICD-10-CM

## 2016-05-14 DIAGNOSIS — M75111 Incomplete rotator cuff tear or rupture of right shoulder, not specified as traumatic: Secondary | ICD-10-CM

## 2016-05-14 DIAGNOSIS — M67921 Unspecified disorder of synovium and tendon, right upper arm: Secondary | ICD-10-CM

## 2016-05-14 DIAGNOSIS — M7551 Bursitis of right shoulder: Secondary | ICD-10-CM

## 2016-05-14 DIAGNOSIS — M19011 Primary osteoarthritis, right shoulder: Secondary | ICD-10-CM

## 2016-05-14 DIAGNOSIS — M25511 Pain in right shoulder: Secondary | ICD-10-CM

## 2016-05-14 NOTE — Telephone Encounter (Signed)
done

## 2016-05-14 NOTE — Telephone Encounter (Signed)
Patient's wife,Teresa,called.  Patient is waiting for his MRI results.  Patient is working and his wife said it's best to call her with results.

## 2016-05-19 ENCOUNTER — Ambulatory Visit (INDEPENDENT_AMBULATORY_CARE_PROVIDER_SITE_OTHER): Payer: BC Managed Care – PPO | Admitting: Cardiovascular Disease

## 2016-05-19 ENCOUNTER — Encounter: Payer: Self-pay | Admitting: Cardiovascular Disease

## 2016-05-19 VITALS — BP 140/88 | HR 65 | Ht 72.0 in | Wt 242.8 lb

## 2016-05-19 DIAGNOSIS — I2510 Atherosclerotic heart disease of native coronary artery without angina pectoris: Secondary | ICD-10-CM | POA: Diagnosis not present

## 2016-05-19 DIAGNOSIS — E785 Hyperlipidemia, unspecified: Secondary | ICD-10-CM

## 2016-05-19 NOTE — Patient Instructions (Signed)
Medication Instructions: Continue same medications.   Labwork: None.   Procedures/Testing: None.   Follow-Up: 1 year with Dr. Cecil Bixby.   Any Additional Special Instructions Will Be Listed Below (If Applicable).     If you need a refill on your cardiac medications before your next appointment, please call your pharmacy.   

## 2016-05-19 NOTE — Progress Notes (Signed)
Cardiology Office Note   Date:  05/19/2016   ID:  Matthew Doyle, Matthew Doyle 08/06/1956, MRN [id number]  PCP:  Viviana Simpler, MD  Cardiologist:   [name], MD   Chief Complaint  Patient presents with  . other    12 month follow up. Meds reviewed by the patient verbally. "doing well."       History of Present Illness: Matthew Doyle is a 60 y.o. male who presents for a followup visit regarding coronary artery disease.  He is s/p angioplasty and drug-eluting stent placement to the LAD in 2004 and PCI And drug-eluting stent placement to OM 2 in January 2014 after a small non-ST elevation myocardial infarction. Cardiac catheterization at that time showed patent LAD stent, occluded right PDA distally supplying a small territory and 99% proximal OM 2 stenosis .  Ejection fraction was normal. He has been doing well from a cardiac standpoint since then with no cardiac events. No chest pain or shortness of breath. He had cholecystectomy in March. His EKG at that time was normal. He is having significant problems with his left shoulder and is going to see orthopedics in the near future. He is also complaining of hot sensation in the bottom of the left feet with some numbness. He has no claudication.   Past Medical History  Diagnosis Date  . Hypertension   . Hyperlipidemia   . Hemorrhoids   . Colon polyps   . Diverticulosis   . Gout   . Internal hemorrhoids without mention of complication   . CAD (coronary artery disease)     a. 09/2003 Cath/PCI: LAD 80%->3.0x23 Cypher DES;  b. 03/2004 Cath: LM nl, LAD 20p, 10m, patent stent, D1 25, D2 small, 80/jailed, LCX 62m, OM1 nl, OM2 20, RCA 40-50p, PDA 86m;  c. Non-ST elevation MI in January of 2014. Cardiac cath showed patent LAD stent, included distal right PDA supplying a small territory, 99% proximal OM 2 status post PCI and DES placement   . Arthritis     knee  . Myocardial infarction ([place])     LAST 12/16/12  . Dysrhythmia     Past  Surgical History  Procedure Laterality Date  . Cypher stent  10/04    mid LAD- gupta  . Cardiac catheterization  2014    s/p stent  . Left heart catheterization with coronary angiogram N/A 12/17/2012    Procedure: LEFT HEART CATHETERIZATION WITH CORONARY ANGIOGRAM;  Surgeon: Peter M Martinique, MD;  [facility] CATH LAB;  Service: Cardiovascular;  Laterality: N/A;  . Percutaneous coronary stent intervention (pci-s) Right 12/17/2012    Procedure: PERCUTANEOUS CORONARY STENT INTERVENTION (PCI-S);  Surgeon: Peter M Martinique, MD;  Location: [facility] CATH LAB;  Service: Cardiovascular;  Laterality: Right;  . Colonoscopy  10/2013  . Coronary angioplasty      STENTS  . Cholecystectomy N/A 03/13/2016    Procedure: LAPAROSCOPIC CHOLECYSTECTOMY WITH INTRAOPERATIVE CHOLANGIOGRAM;  Surgeon: Christene Lye, MD;  Location: [facility] ORS;  Service: General;  Laterality: N/A;     Current Outpatient Prescriptions  Medication Sig Dispense Refill  . amLODipine (NORVASC) 10 MG tablet Take 1 tablet (10 mg total) by mouth daily. 90 tablet 3  . aspirin 81 MG tablet Take 1 tablet (81 mg total) by mouth daily. 30 tablet   . atorvastatin (LIPITOR) 80 MG tablet Take 80 mg by mouth daily at 6 PM.    . colchicine 0.6 MG tablet Take 0.6 mg by mouth 2 (two) times daily as  needed (gout flare). Reported on 03/13/2016    . losartan (COZAAR) 100 MG tablet Take 1 tablet (100 mg total) by mouth daily. 90 tablet 3  . nitroGLYCERIN (NITROSTAT) 0.4 MG SL tablet Place 1 tablet (0.4 mg total) under the tongue every 5 (five) minutes as needed for chest pain. 25 tablet 3   No current facility-administered medications for this visit.    Allergies:   Lisinopril    Social History:  The patient  reports that he has never smoked. He has never used smokeless tobacco. He reports that he does not drink alcohol or use illicit drugs.   Family History:  The patient's family history includes Arrhythmia in his brother; Coronary artery disease in his  father and maternal grandfather; Diabetes in his father; Heart attack in his maternal grandfather, maternal uncle, and paternal uncle; Hypertension in his brother, brother, and mother; Throat cancer in his maternal grandfather. There is no history of Colon cancer, Esophageal cancer, Rectal cancer, or Stomach cancer.    ROS:  Please see the history of present illness.   Otherwise, review of systems are positive for none.   All other systems are reviewed and negative.    PHYSICAL EXAM: VS:  BP 140/88 mmHg  Pulse 65  Ht 6' (1.829 m)  Wt 242 lb 12 oz (110.111 kg)  BMI 32.92 kg/m2 , BMI Body mass index is 32.92 kg/(m^2). GEN: Well nourished, well developed, in no acute distress HEENT: normal Neck: no JVD, carotid bruits, or masses Cardiac: RRR; no murmurs, rubs, or gallops,no edema  Respiratory:  clear to auscultation bilaterally, normal work of breathing GI: soft, nontender, nondistended, + BS MS: no deformity or atrophy Skin: warm and dry, no rash Neuro:  Strength and sensation are intact Psych: euthymic mood, full affect   EKG:  EKG is not ordered today.    Recent Labs: 03/09/2016: ALT 23; BUN 19; Creatinine, Ser 1.19; Hemoglobin 14.4; Platelets 207; Potassium 3.5; Sodium 139; TSH 4.026    Lipid Panel    Component Value Date/Time   CHOL 118 05/28/2015 1116   CHOL 91* 03/31/2013 0914   TRIG 69.0 05/28/2015 1116   HDL 29.90* 05/28/2015 1116   HDL 36* 03/31/2013 0914   CHOLHDL 4 05/28/2015 1116   CHOLHDL 2.5 03/31/2013 0914   VLDL 13.8 05/28/2015 1116   LDLCALC 74 05/28/2015 1116   LDLCALC 47 03/31/2013 0914      Wt Readings from Last 3 Encounters:  05/19/16 242 lb 12 oz (110.111 kg)  05/07/16 242 lb 4 oz (109.884 kg)  03/20/16 243 lb (110.224 kg)       ASSESSMENT AND PLAN:  1.   coronary artery disease involving native coronary arteries without angina: He is doing well overall with no anginal symptoms. Continue medical therapy.  2. Essential hypertension: Blood  pressure is reasonably controlled. The dose of losartan was increased last year.  3. Hyperlipidemia: Continue high dose atorvastatin. Most recent LDL was 74.  4. Peripheral neuropathy: The sensation in his left fetus suggestive of peripheral neuropathy. He has no claudication symptoms and his distal pulses are normal. Thus, I do not think this is vascular in etiology. I advised him to discuss with his primary care physician.   Disposition:   FU with me in 1 year  Signed,  [name], MD  05/19/2016 10:41 AM    [place]

## 2016-05-28 ENCOUNTER — Encounter: Payer: Self-pay | Admitting: Internal Medicine

## 2016-05-30 ENCOUNTER — Encounter: Payer: Self-pay | Admitting: Internal Medicine

## 2016-06-11 ENCOUNTER — Other Ambulatory Visit: Payer: Self-pay | Admitting: Cardiovascular Disease

## 2016-06-14 ENCOUNTER — Other Ambulatory Visit: Payer: Self-pay | Admitting: Cardiovascular Disease

## 2016-06-20 ENCOUNTER — Encounter (HOSPITAL_COMMUNITY)
Admission: EM | Disposition: A | Discharge status: Home / Self Care | Payer: Self-pay | Source: Ambulatory Visit | Attending: Cardiovascular Disease

## 2016-06-20 ENCOUNTER — Encounter (HOSPITAL_COMMUNITY): Payer: Self-pay | Admitting: Cardiovascular Disease

## 2016-06-20 ENCOUNTER — Inpatient Hospital Stay (HOSPITAL_COMMUNITY)
Admission: EM | Admit: 2016-06-20 | Discharge: 2016-06-23 | DRG: 246 | Disposition: A | Discharge status: Home / Self Care | Payer: BC Managed Care – PPO | Source: Ambulatory Visit | Attending: Cardiovascular Disease | Admitting: Cardiovascular Disease

## 2016-06-20 ENCOUNTER — Inpatient Hospital Stay (HOSPITAL_COMMUNITY): Payer: BC Managed Care – PPO

## 2016-06-20 DIAGNOSIS — I10 Essential (primary) hypertension: Secondary | ICD-10-CM | POA: Diagnosis present

## 2016-06-20 DIAGNOSIS — I213 ST elevation (STEMI) myocardial infarction of unspecified site: Secondary | ICD-10-CM

## 2016-06-20 DIAGNOSIS — Z7982 Long term (current) use of aspirin: Secondary | ICD-10-CM | POA: Diagnosis not present

## 2016-06-20 DIAGNOSIS — I2510 Atherosclerotic heart disease of native coronary artery without angina pectoris: Secondary | ICD-10-CM | POA: Diagnosis present

## 2016-06-20 DIAGNOSIS — R570 Cardiogenic shock: Secondary | ICD-10-CM | POA: Diagnosis not present

## 2016-06-20 DIAGNOSIS — Z888 Allergy status to other drugs, medicaments and biological substances status: Secondary | ICD-10-CM | POA: Diagnosis not present

## 2016-06-20 DIAGNOSIS — I2109 ST elevation (STEMI) myocardial infarction involving other coronary artery of anterior wall: Secondary | ICD-10-CM | POA: Diagnosis present

## 2016-06-20 DIAGNOSIS — I4891 Unspecified atrial fibrillation: Secondary | ICD-10-CM | POA: Diagnosis present

## 2016-06-20 DIAGNOSIS — E785 Hyperlipidemia, unspecified: Secondary | ICD-10-CM | POA: Diagnosis present

## 2016-06-20 DIAGNOSIS — Z955 Presence of coronary angioplasty implant and graft: Secondary | ICD-10-CM | POA: Diagnosis not present

## 2016-06-20 DIAGNOSIS — R7303 Prediabetes: Secondary | ICD-10-CM | POA: Diagnosis present

## 2016-06-20 DIAGNOSIS — R0600 Dyspnea, unspecified: Secondary | ICD-10-CM

## 2016-06-20 DIAGNOSIS — I25110 Atherosclerotic heart disease of native coronary artery with unstable angina pectoris: Secondary | ICD-10-CM | POA: Diagnosis not present

## 2016-06-20 DIAGNOSIS — I2102 ST elevation (STEMI) myocardial infarction involving left anterior descending coronary artery: Secondary | ICD-10-CM | POA: Diagnosis not present

## 2016-06-20 DIAGNOSIS — K5900 Constipation, unspecified: Secondary | ICD-10-CM | POA: Diagnosis not present

## 2016-06-20 DIAGNOSIS — Z79899 Other long term (current) drug therapy: Secondary | ICD-10-CM | POA: Diagnosis not present

## 2016-06-20 DIAGNOSIS — R079 Chest pain, unspecified: Secondary | ICD-10-CM | POA: Diagnosis not present

## 2016-06-20 DIAGNOSIS — Z79891 Long term (current) use of opiate analgesic: Secondary | ICD-10-CM | POA: Diagnosis not present

## 2016-06-20 DIAGNOSIS — E876 Hypokalemia: Secondary | ICD-10-CM | POA: Diagnosis present

## 2016-06-20 DIAGNOSIS — I4901 Ventricular fibrillation: Secondary | ICD-10-CM | POA: Diagnosis present

## 2016-06-20 DIAGNOSIS — R9389 Abnormal findings on diagnostic imaging of other specified body structures: Secondary | ICD-10-CM

## 2016-06-20 HISTORY — PX: CARDIAC CATHETERIZATION: SHX172

## 2016-06-20 HISTORY — DX: ST elevation (STEMI) myocardial infarction involving left anterior descending coronary artery: I21.02

## 2016-06-20 HISTORY — DX: Unspecified atrial fibrillation: I48.91

## 2016-06-20 LAB — COMPREHENSIVE METABOLIC PANEL
ALK PHOS: 56 U/L (ref 38–126)
ALT: 28 U/L (ref 17–63)
ANION GAP: 15 (ref 5–15)
AST: 27 U/L (ref 15–41)
Albumin: 3.8 g/dL (ref 3.5–5.0)
BILIRUBIN TOTAL: 1 mg/dL (ref 0.3–1.2)
BUN: 16 mg/dL (ref 6–20)
CHLORIDE: 111 mmol/L (ref 101–111)
CO2: 17 mmol/L — ABNORMAL LOW (ref 22–32)
Calcium: 9.2 mg/dL (ref 8.9–10.3)
Creatinine, Ser: 1.53 mg/dL — ABNORMAL HIGH (ref 0.61–1.24)
GFR, EST AFRICAN AMERICAN: 55 mL/min — AB (ref >60)
GFR, EST NON AFRICAN AMERICAN: 48 mL/min — AB (ref >60)
Glucose, Bld: 200 mg/dL — ABNORMAL HIGH (ref 65–99)
POTASSIUM: 3.1 mmol/L — AB (ref 3.5–5.1)
Sodium: 143 mmol/L (ref 135–145)
TOTAL PROTEIN: 6.8 g/dL (ref 6.5–8.1)

## 2016-06-20 LAB — CBC
HCT: 43.7 % (ref 39.0–52.0)
HCT: 45.5 % (ref 39.0–52.0)
Hemoglobin: 14.7 g/dL (ref 13.0–17.0)
Hemoglobin: 15.2 g/dL (ref 13.0–17.0)
MCH: 28.6 pg (ref 26.0–34.0)
MCH: 28.7 pg (ref 26.0–34.0)
MCHC: 33.6 g/dL (ref 30.0–36.0)
MCHC: 33.4 g/dL (ref 30.0–36.0)
MCV: 85 fL (ref 78.0–100.0)
MCV: 86 fL (ref 78.0–100.0)
PLATELETS: 272 10*3/uL (ref 150–400)
PLATELETS: 269 10*3/uL (ref 150–400)
RBC: 5.14 MIL/uL (ref 4.22–5.81)
RBC: 5.29 MIL/uL (ref 4.22–5.81)
RDW: 13.7 % (ref 11.5–15.5)
RDW: 13.8 % (ref 11.5–15.5)
WBC: 22 10*3/uL — AB (ref 4.0–10.5)
WBC: 22.2 10*3/uL — AB (ref 4.0–10.5)

## 2016-06-20 LAB — CK TOTAL AND CKMB (NOT AT ARMC)
CK, MB: 2.2 ng/mL (ref 0.5–5.0)
Relative Index: INVALID (ref 0.0–2.5)
Total CK: 98 U/L (ref 49–397)

## 2016-06-20 LAB — TSH: TSH: 1.443 u[IU]/mL (ref 0.350–4.500)

## 2016-06-20 LAB — BRAIN NATRIURETIC PEPTIDE: B Natriuretic Peptide: 40.8 pg/mL (ref 0.0–100.0)

## 2016-06-20 LAB — LIPID PANEL
CHOL/HDL RATIO: 3.8 ratio
CHOLESTEROL: 120 mg/dL (ref 0–200)
HDL: 32 mg/dL — ABNORMAL LOW (ref >40)
LDL Cholesterol: 70 mg/dL (ref 0–99)
TRIGLYCERIDES: 88 mg/dL (ref <150)
VLDL: 18 mg/dL (ref 0–40)

## 2016-06-20 LAB — CREATININE, SERUM
Creatinine, Ser: 1.34 mg/dL — ABNORMAL HIGH (ref 0.61–1.24)
GFR calc non Af Amer: 56 mL/min — ABNORMAL LOW (ref >60)

## 2016-06-20 LAB — TROPONIN I: Troponin I: 3.63 ng/mL (ref <0.03)

## 2016-06-20 LAB — PROTIME-INR
INR: 1.25 (ref 0.00–1.49)
PROTHROMBIN TIME: 15.8 s — AB (ref 11.6–15.2)

## 2016-06-20 LAB — MRSA PCR SCREENING: MRSA BY PCR: NEGATIVE

## 2016-06-20 LAB — APTT: APTT: 91 s — AB (ref 24–37)

## 2016-06-20 SURGERY — LEFT HEART CATH AND CORONARY ANGIOGRAPHY
Anesthesia: LOCAL

## 2016-06-20 MED ORDER — MIDAZOLAM HCL 2 MG/2ML IJ SOLN
INTRAMUSCULAR | Status: DC | PRN
  Administered 2016-06-20: 1 mg via INTRAVENOUS

## 2016-06-20 MED ORDER — TICAGRELOR 90 MG PO TABS
180.0000 mg | ORAL_TABLET | Freq: Once | ORAL | Status: AC
Start: 2016-06-20 — End: 2016-06-20
  Administered 2016-06-20: 180 mg via ORAL
  Filled 2016-06-20: qty 2

## 2016-06-20 MED ORDER — ONDANSETRON HCL 4 MG/2ML IJ SOLN
4.0000 mg | Freq: Four times a day (QID) | INTRAMUSCULAR | Status: DC | PRN
  Administered 2016-06-22: 4 mg via INTRAVENOUS
  Filled 2016-06-20: qty 2

## 2016-06-20 MED ORDER — ONDANSETRON HCL 4 MG/2ML IJ SOLN
INTRAMUSCULAR | Status: AC
  Filled 2016-06-20: qty 2

## 2016-06-20 MED ORDER — VERAPAMIL HCL 2.5 MG/ML IV SOLN
INTRAVENOUS | Status: AC
  Filled 2016-06-20: qty 2

## 2016-06-20 MED ORDER — FENTANYL CITRATE (PF) 100 MCG/2ML IJ SOLN
INTRAMUSCULAR | Status: AC
  Filled 2016-06-20: qty 2

## 2016-06-20 MED ORDER — NOREPINEPHRINE BITARTRATE 1 MG/ML IV SOLN
INTRAVENOUS | Status: AC
  Filled 2016-06-20: qty 4

## 2016-06-20 MED ORDER — MIDAZOLAM HCL 2 MG/2ML IJ SOLN
INTRAMUSCULAR | Status: AC
  Filled 2016-06-20: qty 2

## 2016-06-20 MED ORDER — NITROGLYCERIN 1 MG/10 ML FOR IR/CATH LAB
INTRA_ARTERIAL | Status: AC
  Filled 2016-06-20: qty 10

## 2016-06-20 MED ORDER — NOREPINEPHRINE BITARTRATE 1 MG/ML IV SOLN
4.0000 mg | INTRAVENOUS | Status: DC | PRN
  Administered 2016-06-20: 5 ug/kg/min via INTRAVENOUS

## 2016-06-20 MED ORDER — HEPARIN (PORCINE) IN NACL 2-0.9 UNIT/ML-% IJ SOLN
INTRAMUSCULAR | Status: DC | PRN
  Administered 2016-06-20: 10 mL via INTRA_ARTERIAL

## 2016-06-20 MED ORDER — SODIUM CHLORIDE 0.9 % IV SOLN
INTRAVENOUS | Status: AC
  Administered 2016-06-20: 22:00:00 via INTRAVENOUS

## 2016-06-20 MED ORDER — HEPARIN SODIUM (PORCINE) 1000 UNIT/ML IJ SOLN
INTRAMUSCULAR | Status: DC | PRN
  Administered 2016-06-20: 4000 [IU] via INTRAVENOUS
  Administered 2016-06-20: 3000 [IU] via INTRAVENOUS
  Administered 2016-06-20: 5000 [IU] via INTRAVENOUS

## 2016-06-20 MED ORDER — HEPARIN (PORCINE) IN NACL 2-0.9 UNIT/ML-% IJ SOLN
INTRAMUSCULAR | Status: AC
  Filled 2016-06-20: qty 1000

## 2016-06-20 MED ORDER — LIDOCAINE HCL (PF) 1 % IJ SOLN
INTRAMUSCULAR | Status: DC | PRN
  Administered 2016-06-20: 3 mL via INTRADERMAL

## 2016-06-20 MED ORDER — FENTANYL CITRATE (PF) 100 MCG/2ML IJ SOLN
INTRAMUSCULAR | Status: DC | PRN
  Administered 2016-06-20: 25 ug via INTRAVENOUS

## 2016-06-20 MED ORDER — TIROFIBAN HCL IN NACL 5-0.9 MG/100ML-% IV SOLN
0.1500 ug/kg/min | INTRAVENOUS | Status: AC
  Administered 2016-06-20 – 2016-06-21 (×3): 0.15 ug/kg/min via INTRAVENOUS
  Filled 2016-06-20 (×4): qty 100

## 2016-06-20 MED ORDER — SODIUM CHLORIDE 0.9 % IV SOLN
INTRAVENOUS | Status: DC | PRN
  Administered 2016-06-20: 250 mL/h via INTRAVENOUS

## 2016-06-20 MED ORDER — OXYCODONE-ACETAMINOPHEN 5-325 MG PO TABS
1.0000 | ORAL_TABLET | ORAL | Status: DC | PRN
  Administered 2016-06-21 (×2): 2 via ORAL
  Administered 2016-06-21: 1 via ORAL
  Administered 2016-06-21 – 2016-06-22 (×2): 2 via ORAL
  Filled 2016-06-20 (×2): qty 2
  Filled 2016-06-20: qty 1
  Filled 2016-06-20 (×3): qty 2

## 2016-06-20 MED ORDER — NITROGLYCERIN 0.4 MG SL SUBL: 0.4000 mg | SUBLINGUAL_TABLET | SUBLINGUAL | Status: DC | PRN

## 2016-06-20 MED ORDER — PHENYLEPHRINE HCL 10 MG/ML IJ SOLN
INTRAMUSCULAR | Status: AC
  Filled 2016-06-20: qty 1

## 2016-06-20 MED ORDER — FAMOTIDINE IN NACL 20-0.9 MG/50ML-% IV SOLN
INTRAVENOUS | Status: AC
  Filled 2016-06-20: qty 50

## 2016-06-20 MED ORDER — TICAGRELOR 90 MG PO TABS
ORAL_TABLET | ORAL | Status: AC
  Filled 2016-06-20: qty 2

## 2016-06-20 MED ORDER — ATORVASTATIN CALCIUM 80 MG PO TABS
80.0000 mg | ORAL_TABLET | Freq: Every day | ORAL | Status: DC
  Administered 2016-06-21 – 2016-06-22 (×2): 80 mg via ORAL
  Filled 2016-06-20 (×3): qty 1

## 2016-06-20 MED ORDER — SODIUM CHLORIDE 0.9 % IV SOLN
250.0000 mL | INTRAVENOUS | Status: DC | PRN
  Administered 2016-06-20: 22:00:00 via INTRAVENOUS

## 2016-06-20 MED ORDER — HEPARIN SODIUM (PORCINE) 1000 UNIT/ML IJ SOLN
INTRAMUSCULAR | Status: AC
  Filled 2016-06-20: qty 1

## 2016-06-20 MED ORDER — BIVALIRUDIN BOLUS VIA INFUSION - CUPID: INTRAVENOUS | Status: DC | PRN

## 2016-06-20 MED ORDER — DIAZEPAM 5 MG PO TABS: 5.0000 mg | ORAL_TABLET | Freq: Four times a day (QID) | ORAL | Status: DC | PRN

## 2016-06-20 MED ORDER — CETYLPYRIDINIUM CHLORIDE 0.05 % MT LIQD: 7.0000 mL | Freq: Two times a day (BID) | OROMUCOSAL | Status: DC

## 2016-06-20 MED ORDER — AMIODARONE HCL IN DEXTROSE 360-4.14 MG/200ML-% IV SOLN
INTRAVENOUS | Status: DC | PRN
  Administered 2016-06-20: 60 mg/h via INTRAVENOUS

## 2016-06-20 MED ORDER — HEPARIN (PORCINE) IN NACL 2-0.9 UNIT/ML-% IJ SOLN
INTRAMUSCULAR | Status: DC | PRN
  Administered 2016-06-20: 1000 mL

## 2016-06-20 MED ORDER — HEPARIN SODIUM (PORCINE) 5000 UNIT/ML IJ SOLN
5000.0000 [IU] | Freq: Three times a day (TID) | INTRAMUSCULAR | Status: DC
Start: 2016-06-20 — End: 2016-06-20
  Filled 2016-06-20: qty 1

## 2016-06-20 MED ORDER — SODIUM CHLORIDE 0.9% FLUSH
3.0000 mL | Freq: Two times a day (BID) | INTRAVENOUS | Status: DC
  Administered 2016-06-21 – 2016-06-23 (×4): 3 mL via INTRAVENOUS

## 2016-06-20 MED ORDER — HEPARIN SODIUM (PORCINE) 5000 UNIT/ML IJ SOLN
5000.0000 [IU] | Freq: Three times a day (TID) | INTRAMUSCULAR | Status: DC
  Administered 2016-06-21 – 2016-06-23 (×5): 5000 [IU] via SUBCUTANEOUS
  Filled 2016-06-20 (×5): qty 1

## 2016-06-20 MED ORDER — TIROFIBAN (AGGRASTAT) BOLUS VIA INFUSION
INTRAVENOUS | Status: DC | PRN
  Administered 2016-06-20: 2750 ug via INTRAVENOUS

## 2016-06-20 MED ORDER — NOREPINEPHRINE BITARTRATE 1 MG/ML IV SOLN
0.0000 ug/min | INTRAVENOUS | Status: DC
  Administered 2016-06-20: 3 ug/min via INTRAVENOUS
  Filled 2016-06-20: qty 4

## 2016-06-20 MED ORDER — TIROFIBAN HCL IN NACL 5-0.9 MG/100ML-% IV SOLN
INTRAVENOUS | Status: AC
  Filled 2016-06-20: qty 100

## 2016-06-20 MED ORDER — SODIUM CHLORIDE 0.9 % IV SOLN
INTRAVENOUS | Status: DC | PRN
  Administered 2016-06-20: 100 mL/h via INTRAVENOUS

## 2016-06-20 MED ORDER — AMIODARONE HCL IN DEXTROSE 360-4.14 MG/200ML-% IV SOLN
60.0000 mg/h | INTRAVENOUS | Status: AC
  Administered 2016-06-20 – 2016-06-21 (×2): 60 mg/h via INTRAVENOUS
  Filled 2016-06-20: qty 200

## 2016-06-20 MED ORDER — ONDANSETRON HCL 4 MG/2ML IJ SOLN
INTRAMUSCULAR | Status: DC | PRN
  Administered 2016-06-20: 4 mg via INTRAVENOUS

## 2016-06-20 MED ORDER — TIROFIBAN HCL IN NACL 5-0.9 MG/100ML-% IV SOLN
INTRAVENOUS | Status: DC | PRN
  Administered 2016-06-20: 0.15 ug/kg/min via INTRAVENOUS

## 2016-06-20 MED ORDER — AMIODARONE HCL IN DEXTROSE 360-4.14 MG/200ML-% IV SOLN
INTRAVENOUS | Status: AC
  Filled 2016-06-20: qty 200

## 2016-06-20 MED ORDER — MORPHINE SULFATE (PF) 2 MG/ML IV SOLN: 2.0000 mg | INTRAVENOUS | Status: DC | PRN

## 2016-06-20 MED ORDER — FAMOTIDINE IN NACL 20-0.9 MG/50ML-% IV SOLN
INTRAVENOUS | Status: DC | PRN
  Administered 2016-06-20: 20 mg via INTRAVENOUS

## 2016-06-20 MED ORDER — ACETAMINOPHEN 325 MG PO TABS
650.0000 mg | ORAL_TABLET | ORAL | Status: DC | PRN
  Administered 2016-06-22: 650 mg via ORAL
  Filled 2016-06-20: qty 2

## 2016-06-20 MED ORDER — IOPAMIDOL (ISOVUE-370) INJECTION 76%
INTRAVENOUS | Status: DC | PRN
  Administered 2016-06-20: 165 mL via INTRA_ARTERIAL

## 2016-06-20 MED ORDER — TICAGRELOR 90 MG PO TABS
90.0000 mg | ORAL_TABLET | Freq: Two times a day (BID) | ORAL | Status: DC
  Administered 2016-06-21 – 2016-06-23 (×5): 90 mg via ORAL
  Filled 2016-06-20 (×5): qty 1

## 2016-06-20 MED ORDER — SODIUM CHLORIDE 0.9% FLUSH: 3.0000 mL | INTRAVENOUS | Status: DC | PRN

## 2016-06-20 MED ORDER — ASPIRIN EC 81 MG PO TBEC
81.0000 mg | DELAYED_RELEASE_TABLET | Freq: Every day | ORAL | Status: DC
  Administered 2016-06-21 – 2016-06-23 (×3): 81 mg via ORAL
  Filled 2016-06-20 (×3): qty 1

## 2016-06-20 MED ORDER — AMIODARONE HCL IN DEXTROSE 360-4.14 MG/200ML-% IV SOLN
30.0000 mg/h | INTRAVENOUS | Status: DC
  Administered 2016-06-21 – 2016-06-22 (×4): 30 mg/h via INTRAVENOUS
  Filled 2016-06-20 (×3): qty 200

## 2016-06-20 MED ORDER — NITROGLYCERIN 1 MG/10 ML FOR IR/CATH LAB
INTRA_ARTERIAL | Status: DC | PRN
  Administered 2016-06-20: 100 ug via INTRACORONARY

## 2016-06-20 MED ORDER — LIDOCAINE HCL (PF) 1 % IJ SOLN
INTRAMUSCULAR | Status: AC
  Filled 2016-06-20: qty 30

## 2016-06-20 SURGICAL SUPPLY — 22 items
BALLN EMERGE MR 2.5X12 (BALLOONS) ×2
BALLN ~~LOC~~ EUPHORA RX 3.75X15 (BALLOONS) ×2
BALLOON EMERGE MR 2.5X12 (BALLOONS) IMPLANT
BALLOON ~~LOC~~ EUPHORA RX 3.75X15 (BALLOONS) IMPLANT
CATH EXTRAC PRONTO 5.5F 138CM (CATHETERS) ×1 IMPLANT
CATH INFINITI 5FR ANG PIGTAIL (CATHETERS) ×1 IMPLANT
CATH INFINITI JR4 5F (CATHETERS) ×1 IMPLANT
CATH VISTA GUIDE 6FR XBLAD3.5 (CATHETERS) ×1 IMPLANT
DEVICE RAD COMP TR BAND LRG (VASCULAR PRODUCTS) ×1 IMPLANT
ELECT DEFIB PAD ADLT CADENCE (PAD) ×1 IMPLANT
GLIDESHEATH SLEND SS 6F .021 (SHEATH) ×1 IMPLANT
KIT ENCORE 26 ADVANTAGE (KITS) ×1 IMPLANT
KIT HEART LEFT (KITS) ×2 IMPLANT
PACK CARDIAC CATHETERIZATION (CUSTOM PROCEDURE TRAY) ×2 IMPLANT
PROTECTION STATION PRESSURIZED (MISCELLANEOUS) ×2
STATION PROTECTION PRESSURIZED (MISCELLANEOUS) IMPLANT
STENT PROMUS PREM MR 3.5X24 (Permanent Stent) ×1 IMPLANT
SYR MEDRAD MARK V 150ML (SYRINGE) ×2 IMPLANT
TRANSDUCER W/STOPCOCK (MISCELLANEOUS) ×2 IMPLANT
TUBING CIL FLEX 10 FLL-RA (TUBING) ×2 IMPLANT
WIRE COUGAR XT STRL 190CM (WIRE) ×1 IMPLANT
WIRE SAFE-T 1.5MM-J .035X260CM (WIRE) ×1 IMPLANT

## 2016-06-20 NOTE — H&P (Signed)
History and Physical  Patient ID: Matthew Doyle MRN: [id number], SOB: [birth year] 60 y.o. Date of Encounter: 06/20/2016, 8:58 PM  Primary Physician: Viviana Simpler, MD Primary Cardiologist: Dr Fletcher Anon  Chief Complaint: Chest pain  HPI: 60 y.o. male w/ PMHx significant for CAD and prior PCI who presented to [facility] on 06/20/2016 with complaints of chest pain.  He is brought directly to the cath lab as a Code STEMI from [place].   The patient has a history of coronary artery disease. He's had 2 previous MIs. He was treated with stenting of the mid LAD with a Cypher DES in 2004. He was treated with stenting of the first obtuse marginal with a Promus DES in 2014. He had nonobstructive disease elsewhere and a patent stent in the LAD at the time of his last heart catheterization.  The patient was in his normal state of health today. He was working as a Building control surveyor. He developed sudden onset of severe chest pain approximately 90 minutes ago. EMS was called and his initial EKG demonstrated an anterior lateral STEMI. A code STEMI was called. The patient was in route when he went into ventricular fibrillation. The paramedics had [place]uscitate him. He received about 2 minutes of CPR and a single defibrillation. He received aspirin 324 mg and fentanyl 150 g and route. On arrival here he is awake and alert. He complains of 9/10 chest pain as well as shortness of breath. No other complaints at present.   Past Medical History  Diagnosis Date  . Hypertension   . Hyperlipidemia   . Hemorrhoids   . Colon polyps   . Diverticulosis   . Gout   . Internal hemorrhoids without mention of complication   . CAD (coronary artery disease)     a. 09/2003 Cath/PCI: LAD 80%->3.0x23 Cypher DES;  b. 03/2004 Cath: LM nl, LAD 20p, 1m, patent stent, D1 25, D2 small, 80/jailed, LCX 17m, OM1 nl, OM2 20, RCA 40-50p, PDA 68m;  c. Non-ST elevation MI in January of 2014.  Cardiac cath showed patent LAD stent, included distal right PDA supplying a small territory, 99% proximal OM 2 status post PCI and DES placement   . Arthritis     knee  . Myocardial infarction ([place])     LAST 12/16/12  . Dysrhythmia   . ST elevation (STEMI) myocardial infarction involving left anterior descending coronary artery ([place]) 06/20/2016     Surgical History:  Past Surgical History  Procedure Laterality Date  . Cypher stent  10/04    mid LAD- gupta  . Cardiac catheterization  2014    s/p stent  . Left heart catheterization with coronary angiogram N/A 12/17/2012    Procedure: LEFT HEART CATHETERIZATION WITH CORONARY ANGIOGRAM;  Surgeon: Peter M Martinique, MD;  Location: [facility] CATH LAB;  Service: Cardiovascular;  Laterality: N/A;  . Percutaneous coronary stent intervention (pci-s) Right 12/17/2012    Procedure: PERCUTANEOUS CORONARY STENT INTERVENTION (PCI-S);  Surgeon: Peter M Martinique, MD;  Location: [facility] CATH LAB;  Service: Cardiovascular;  Laterality: Right;  . Colonoscopy  10/2013  . Coronary angioplasty      STENTS  . Cholecystectomy N/A 03/13/2016    Procedure: LAPAROSCOPIC CHOLECYSTECTOMY WITH INTRAOPERATIVE CHOLANGIOGRAM;  Surgeon: Christene Lye, MD;  Location: [facility] ORS;  Service: General;  Laterality: N/A;     Home Meds: Prior to Admission medications   Medication Sig Start Date End Date Taking? Authorizing Provider  amLODipine (NORVASC) 10 MG tablet Take 1 tablet (10 mg total) by mouth daily. 05/28/15   Venia Carbon, MD  aspirin 81 MG tablet Take 1 tablet (81 mg total) by mouth daily. 12/18/12   Rhonda G Barrett, PA-C  atorvastatin (LIPITOR) 80 MG tablet Take 80 mg by mouth daily at 6 PM.    Historical Provider, MD  atorvastatin (LIPITOR) 80 MG tablet TAKE ONE TABLET BY MOUTH ONCE DAILY AT  6  PM 06/11/16   Wellington Hampshire, MD  colchicine 0.6 MG tablet Take 0.6 mg by mouth 2 (two) times daily as needed (gout flare). Reported on 03/13/2016    Historical Provider, MD    losartan (COZAAR) 100 MG tablet TAKE ONE TABLET BY MOUTH ONCE DAILY 06/16/16   Wellington Hampshire, MD  nitroGLYCERIN (NITROSTAT) 0.4 MG SL tablet Place 1 tablet (0.4 mg total) under the tongue every 5 (five) minutes as needed for chest pain. 12/18/12   Evelene Croon Barrett, PA-C    Allergies:  Allergies  Allergen Reactions  . Lisinopril Other (See Comments)    REACTION: cough with this and/or benazepril    Social History   Social History  . Marital Status: Married    Spouse Name: N/A  . Number of Children: 2  . Years of Education: N/A   Occupational History  .     [employer] [place] History Main Topics  . Smoking status: Never Smoker   . Smokeless tobacco: Never Used  . Alcohol Use: No  . Drug Use: No  . Sexual Activity: Not on file   Other Topics Concern  . Not on file   Social History Narrative   Retired as Social research officer, government for [employer]           Family History  Problem Relation Age of Onset  . Diabetes Father   . Coronary artery disease Father     CABG - 5 vessel 15 yr ago  . Hypertension Brother   . Hypertension Brother   . Arrhythmia Brother   . Heart attack Paternal Uncle     Age 67  . Heart attack Maternal Uncle     Age 89  . Coronary artery disease Maternal Grandfather   . Heart attack Maternal Grandfather     Died in 19s  . Throat cancer Maternal Grandfather   . Hypertension Mother   . Colon cancer Neg Hx   . Esophageal cancer Neg Hx   . Rectal cancer Neg Hx   . Stomach cancer Neg Hx     Review of Systems: General: negative for chills, fever, night sweats or weight changes.  ENT: negative for rhinorrhea or epistaxis Cardiovascular: see HPI Dermatological: negative for rash Respiratory: negative for cough or wheezing GI: negative for nausea, vomiting, diarrhea, bright red blood per rectum, melena, or hematemesis GU: no hematuria, urgency, or frequency Neurologic: negative for visual changes, headache, or  dizziness Heme: no easy bruising or bleeding Endo: negative for excessive thirst, thyroid disorder, or flushing Musculoskeletal: negative for joint pain or swelling, negative for myalgias All other systems reviewed and are otherwise negative except as noted above.  Physical Exam: Blood pressure 120/88, pulse 85, resp. rate 22, SpO2 89 %. General: Obese male, alert and oriented, in moderate acute distress. HEENT: Normocephalic, atraumatic, sclera anicteric Neck: Supple. Carotids 2+ without bruits. JVP normal Lungs: Clear bilaterally to auscultation without wheezes, rales, or rhonchi. Breathing is unlabored. Heart: RRR with normal S1 and S2. No  murmurs, rubs, or gallops appreciated. Abdomen: Soft, obese, non-tender, non-distended with normoactive bowel sounds. No hepatomegaly. No rebound/guarding. No obvious abdominal masses. Back: No CVA tenderness Msk:  Strength and tone appear normal for age. Extremities: No clubbing, cyanosis, or edema.  Distal pedal pulses are 2+ and equal bilaterally. Neuro: CNII-XII intact, moves all extremities spontaneously. Psych:  Responds to questions appropriately with a normal affect. Skin: warm and dry without rash   Labs:   Lab Results  Component Value Date   WBC 22.0* 06/20/2016   HGB 14.7 06/20/2016   HCT 43.7 06/20/2016   MCV 85.0 06/20/2016   PLT 272 06/20/2016     Recent Labs Lab 06/20/16 1938  NA 143  K 3.1*  CL 111  CO2 17*  BUN 16  CREATININE 1.53*  CALCIUM 9.2  PROT 6.8  BILITOT 1.0  ALKPHOS 56  ALT 28  AST 27  GLUCOSE 200*    Recent Labs  06/20/16 1938  CKTOTAL 98  CKMB 2.2   Lab Results  Component Value Date   CHOL 120 06/20/2016   HDL 32* 06/20/2016   LDLCALC 70 06/20/2016   TRIG 88 06/20/2016   No results found for: DDIMER  Radiology/Studies:  No results found.   EKG: NSR with anterolateral STEMI and reciprocal inferior changes  CARDIAC STUDIES: pending  ASSESSMENT AND PLAN:  1. Acute anterolateral  STEMI 2. Ventricular fibrillation arrest 3. Known CAD with history of PCI of the LAD and OM 4. Hypertension 5. Hyperlipidemia  Pt with acute STEMI complicated by VF arrest en route and cardiogenic shock on arrival. Plan emergency cardiac cath and PCI, possible hemodynamic support if needed, further disposition pending cath findings and clinical course. Initiate aggressive post-MI medical therapy as tolerated.   Deatra James MD 06/20/2016, 8:58 PM

## 2016-06-20 NOTE — Progress Notes (Signed)
Pharmacy Consult Note - Aggrastat   Pt with STEMI, s/p PCI  Had asa 324 in route  Went in to VF arrest in route, was resuscitated by EMS To continue aggrastat for 18 hours - this was started at ~1945 CBC in am - f/u plts Aggrastat to end at [address]  06/20/2016 9:53 PM

## 2016-06-20 NOTE — Progress Notes (Signed)
CRITICAL VALUE ALERT  Critical value received:  Troponin 3.63  Date of notification:  06/20/16  Time of notification:  2235  Critical value read back:Yes.    Nurse who received alert:  Carolanne Grumbling, RN  MD notified (1st page):  Hochrein  Time of first page:  2236

## 2016-06-21 ENCOUNTER — Inpatient Hospital Stay (HOSPITAL_COMMUNITY): Payer: BC Managed Care – PPO

## 2016-06-21 DIAGNOSIS — R079 Chest pain, unspecified: Secondary | ICD-10-CM

## 2016-06-21 DIAGNOSIS — E785 Hyperlipidemia, unspecified: Secondary | ICD-10-CM

## 2016-06-21 DIAGNOSIS — I2102 ST elevation (STEMI) myocardial infarction involving left anterior descending coronary artery: Secondary | ICD-10-CM

## 2016-06-21 DIAGNOSIS — I10 Essential (primary) hypertension: Secondary | ICD-10-CM

## 2016-06-21 DIAGNOSIS — I2510 Atherosclerotic heart disease of native coronary artery without angina pectoris: Secondary | ICD-10-CM

## 2016-06-21 LAB — HEMOGLOBIN A1C
HEMOGLOBIN A1C: 6.3 % — AB (ref 4.8–5.6)
MEAN PLASMA GLUCOSE: 134 mg/dL

## 2016-06-21 LAB — BASIC METABOLIC PANEL
Anion gap: 7 (ref 5–15)
BUN: 13 mg/dL (ref 6–20)
CO2: 23 mmol/L (ref 22–32)
CREATININE: 1.18 mg/dL (ref 0.61–1.24)
Calcium: 8.9 mg/dL (ref 8.9–10.3)
Chloride: 111 mmol/L (ref 101–111)
Glucose, Bld: 186 mg/dL — ABNORMAL HIGH (ref 65–99)
Potassium: 3.9 mmol/L (ref 3.5–5.1)
SODIUM: 141 mmol/L (ref 135–145)

## 2016-06-21 LAB — ECHOCARDIOGRAM COMPLETE
Height: 72 in
WEIGHTICAEL: 3880.1 [oz_av]

## 2016-06-21 LAB — POCT I-STAT, CHEM 8
BUN: 16 mg/dL (ref 6–20)
CALCIUM ION: 1.01 mmol/L — AB (ref 1.12–1.23)
Chloride: 123 mmol/L — ABNORMAL HIGH (ref 101–111)
Creatinine, Ser: 1.4 mg/dL — ABNORMAL HIGH (ref 0.61–1.24)
GLUCOSE: 201 mg/dL — AB (ref 65–99)
HCT: 43 % (ref 39.0–52.0)
HEMOGLOBIN: 14.6 g/dL (ref 13.0–17.0)
POTASSIUM: 3.1 mmol/L — AB (ref 3.5–5.1)
Sodium: 141 mmol/L (ref 135–145)
TCO2: 18 mmol/L (ref 0–100)

## 2016-06-21 LAB — CBC
HCT: 43.7 % (ref 39.0–52.0)
Hemoglobin: 14.6 g/dL (ref 13.0–17.0)
MCH: 28.9 pg (ref 26.0–34.0)
MCHC: 33.4 g/dL (ref 30.0–36.0)
MCV: 86.4 fL (ref 78.0–100.0)
PLATELETS: 230 10*3/uL (ref 150–400)
RBC: 5.06 MIL/uL (ref 4.22–5.81)
RDW: 14.1 % (ref 11.5–15.5)
WBC: 15.2 10*3/uL — AB (ref 4.0–10.5)

## 2016-06-21 LAB — TROPONIN I
TROPONIN I: 21.64 ng/mL — AB (ref <0.03)
Troponin I: 17.51 ng/mL (ref <0.03)

## 2016-06-21 LAB — POCT ACTIVATED CLOTTING TIME: ACTIVATED CLOTTING TIME: 246 s

## 2016-06-21 MED ORDER — EZETIMIBE 10 MG PO TABS
10.0000 mg | ORAL_TABLET | Freq: Every day | ORAL | Status: DC
  Administered 2016-06-21 – 2016-06-23 (×3): 10 mg via ORAL
  Filled 2016-06-21 (×3): qty 1

## 2016-06-21 MED ORDER — PERFLUTREN LIPID MICROSPHERE
INTRAVENOUS | Status: AC
  Administered 2016-06-21: 3 mL
  Filled 2016-06-21: qty 10

## 2016-06-21 NOTE — Progress Notes (Deleted)
  Echocardiogram 2D Echocardiogram has been performed.  Matthew Doyle 06/21/2016, 1:22 PM

## 2016-06-21 NOTE — Progress Notes (Signed)
  Echocardiogram 2D Echocardiogram has been performed.  Matthew Doyle 06/21/2016, 1:21 PM

## 2016-06-21 NOTE — Progress Notes (Signed)
DAILY PROGRESS NOTE  Subjective:  Feels better today. Sitting up and eating breakfast. Weaned off of pressors overnight. Successful PCI to the proximal LAD for occlusion - LVEF ~45%. Remains on amiodarone - now in rate-controlled a-fib. Chest is sore, but mostly with breathing. Troponin rose to 17.51 this morning.    Objective:  Temp:  [97.6 F (36.4 C)-98.3 F (36.8 C)] 97.6 F (36.4 C) (07/22 0400) Pulse Rate:  [0-193] 72 (07/22 0700) Resp:  [0-24] 17 (07/22 0700) BP: (91-137)/(65-105) 119/101 mmHg (07/22 0700) SpO2:  [0 %-100 %] 97 % (07/22 0700) FiO2 (%):  [2 %] 2 % (07/21 2208) Weight:  [242 lb 8.1 oz (110 kg)] 242 lb 8.1 oz (110 kg) (07/21 2100) Weight change:   Intake/Output from previous day: 07/21 0701 - 07/22 0700 In: 4447 [I.V.:4022] Out: 1750 [Urine:1750]  Intake/Output from this shift:    Medications: Current Facility-Administered Medications  Medication Dose Route Frequency Provider Last Rate Last Dose  . 0.9 %  sodium chloride infusion  250 mL Intravenous PRN Sherren Mocha, MD 10 mL/hr at 06/20/16 2130    . acetaminophen (TYLENOL) tablet 650 mg  650 mg Oral Q4H PRN Sherren Mocha, MD      . amiodarone (NEXTERONE PREMIX) 360-4.14 MG/200ML-% (1.8 mg/mL) IV infusion  30 mg/hr Intravenous Continuous Sherren Mocha, MD 16.7 mL/hr at 06/21/16 0345 30 mg/hr at 06/21/16 0345  . antiseptic oral rinse (CPC / CETYLPYRIDINIUM CHLORIDE 0.05%) solution 7 mL  7 mL Mouth Rinse BID Sherren Mocha, MD   7 mL at 06/20/16 2315  . aspirin EC tablet 81 mg  81 mg Oral Daily Sherren Mocha, MD      . atorvastatin (LIPITOR) tablet 80 mg  80 mg Oral q1800 Sherren Mocha, MD   80 mg at 06/20/16 2201  . diazepam (VALIUM) tablet 5 mg  5 mg Oral Q6H PRN Sherren Mocha, MD      . heparin injection 5,000 Units  5,000 Units Subcutaneous Q8H Jake Church Masters, [facility]      . morphine 2 MG/ML injection 2 mg  2 mg Intravenous Q1H PRN Sherren Mocha, MD      . nitroGLYCERIN (NITROSTAT) SL tablet  0.4 mg  0.4 mg Sublingual Q5 Min x 3 PRN Sherren Mocha, MD      . norepinephrine (LEVOPHED) 4 mg in dextrose 5 % 250 mL (0.016 mg/mL) infusion  0-40 mcg/min Intravenous Titrated Sherren Mocha, MD   Stopped at 06/20/16 2215  . ondansetron (ZOFRAN) injection 4 mg  4 mg Intravenous Q6H PRN Sherren Mocha, MD      . oxyCODONE-acetaminophen (PERCOCET/ROXICET) 5-325 MG per tablet 1-2 tablet  1-2 tablet Oral Q4H PRN Sherren Mocha, MD   1 tablet at 06/21/16 0231  . sodium chloride flush (NS) 0.9 % injection 3 mL  3 mL Intravenous Q12H Sherren Mocha, MD   3 mL at 06/21/16 0056  . sodium chloride flush (NS) 0.9 % injection 3 mL  3 mL Intravenous PRN Sherren Mocha, MD      . ticagrelor [facility]) tablet 90 mg  90 mg Oral BID Sherren Mocha, MD      . tirofiban (AGGRASTAT) infusion 50 mcg/mL 100 mL  0.15 mcg/kg/min Intravenous Continuous Jake Church Masters, RPH 19.8 mL/hr at 06/21/16 0215 0.15 mcg/kg/min at 06/21/16 0215    Physical Exam: General appearance: alert, no distress and mild chest soreness Neck: no carotid bruit and no JVD Lungs: diminished breath sounds bibasilar Heart: irregularly irregular rhythm Abdomen: soft, non-tender; bowel sounds  normal; no masses,  no organomegaly and obese Extremities: extremities normal, atraumatic, no cyanosis or edema Pulses: 2+ and symmetric Skin: Skin color, texture, turgor normal. No rashes or lesions or WWP extremitites Neurologic: Grossly normal Psych: Pleasant  Lab Results: Results for orders placed or performed during the hospital encounter of 06/20/16 (from the past 48 hour(s))  Comprehensive metabolic panel     Status: Abnormal   Collection Time: 06/20/16  7:38 PM  Result Value Ref Range   Sodium 143 135 - 145 mmol/L   Potassium 3.1 (L) 3.5 - 5.1 mmol/L   Chloride 111 101 - 111 mmol/L   CO2 17 (L) 22 - 32 mmol/L   Glucose, Bld 200 (H) 65 - 99 mg/dL   BUN 16 6 - 20 mg/dL   Creatinine, Ser 1.53 (H) 0.61 - 1.24 mg/dL   Calcium 9.2 8.9 - 10.3  mg/dL   Total Protein 6.8 6.5 - 8.1 g/dL   Albumin 3.8 3.5 - 5.0 g/dL   AST 27 15 - 41 U/L   ALT 28 17 - 63 U/L   Alkaline Phosphatase 56 38 - 126 U/L   Total Bilirubin 1.0 0.3 - 1.2 mg/dL   GFR calc non Af Amer 48 (L) >60 mL/min   GFR calc Af Amer 55 (L) >60 mL/min    Comment: (NOTE) The eGFR has been calculated using the CKD EPI equation. This calculation has not been validated in all clinical situations. eGFR's persistently <60 mL/min signify possible Chronic Kidney Disease.    Anion gap 15 5 - 15  Lipid panel     Status: Abnormal   Collection Time: 06/20/16  7:38 PM  Result Value Ref Range   Cholesterol 120 0 - 200 mg/dL   Triglycerides 88 <150 mg/dL   HDL 32 (L) >40 mg/dL   Total CHOL/HDL Ratio 3.8 RATIO   VLDL 18 0 - 40 mg/dL   LDL Cholesterol 70 0 - 99 mg/dL    Comment:        Total Cholesterol/HDL:CHD Risk Coronary Heart Disease Risk Table                     Men   Women  1/2 Average Risk   3.4   3.3  Average Risk       5.0   4.4  2 X Average Risk   9.6   7.1  3 X Average Risk  23.4   11.0        Use the calculated Patient Ratio above and the CHD Risk Table to determine the patient's CHD Risk.        ATP III CLASSIFICATION (LDL):  <100     mg/dL   Optimal  100-129  mg/dL   Near or Above                    Optimal  130-159  mg/dL   Borderline  160-189  mg/dL   High  >190     mg/dL   Very High   CK total and CKMB (cardiac)not at [facility]     Status: None   Collection Time: 06/20/16  7:38 PM  Result Value Ref Range   Total CK 98 49 - 397 U/L   CK, MB 2.2 0.5 - 5.0 ng/mL   Relative Index RELATIVE INDEX IS INVALID 0.0 - 2.5    Comment: WHEN CK < 100 U/L          Hemoglobin A1c  Status: Abnormal   Collection Time: 06/20/16  7:38 PM  Result Value Ref Range   Hgb A1c MFr Bld 6.3 (H) 4.8 - 5.6 %    Comment: (NOTE)         Pre-diabetes: 5.7 - 6.4         Diabetes: >6.4         Glycemic control for adults with diabetes: <7.0    Mean Plasma Glucose 134 mg/dL     Comment: (NOTE) Performed At: [facility] [place], [place] [id number] Lindon Romp MD [id number]   CBC     Status: Abnormal   Collection Time: 06/20/16  7:38 PM  Result Value Ref Range   WBC 22.0 (H) 4.0 - 10.5 K/uL   RBC 5.14 4.22 - 5.81 MIL/uL   Hemoglobin 14.7 13.0 - 17.0 g/dL   HCT 43.7 39.0 - 52.0 %   MCV 85.0 78.0 - 100.0 fL   MCH 28.6 26.0 - 34.0 pg   MCHC 33.6 30.0 - 36.0 g/dL   RDW 13.7 11.5 - 15.5 %   Platelets 272 150 - 400 K/uL  Protime-INR     Status: Abnormal   Collection Time: 06/20/16  7:38 PM  Result Value Ref Range   Prothrombin Time 15.8 (H) 11.6 - 15.2 seconds   INR 1.25 0.00 - 1.49  APTT     Status: Abnormal   Collection Time: 06/20/16  7:38 PM  Result Value Ref Range   aPTT 91 (H) 24 - 37 seconds    Comment:        IF BASELINE aPTT IS ELEVATED, SUGGEST PATIENT RISK ASSESSMENT BE USED TO DETERMINE APPROPRIATE ANTICOAGULANT THERAPY.   I-STAT, chem 8     Status: Abnormal   Collection Time: 06/20/16  7:38 PM  Result Value Ref Range   Sodium 141 135 - 145 mmol/L   Potassium 3.1 (L) 3.5 - 5.1 mmol/L   Chloride 123 (H) 101 - 111 mmol/L   BUN 16 6 - 20 mg/dL   Creatinine, Ser 1.40 (H) 0.61 - 1.24 mg/dL   Glucose, Bld 201 (H) 65 - 99 mg/dL   Calcium, Ion 1.01 (L) 1.12 - 1.23 mmol/L   TCO2 18 0 - 100 mmol/L   Hemoglobin 14.6 13.0 - 17.0 g/dL   HCT 43.0 39.0 - 52.0 %  POCT Activated clotting time     Status: None   Collection Time: 06/20/16  7:49 PM  Result Value Ref Range   Activated Clotting Time 246 seconds  MRSA PCR Screening     Status: None   Collection Time: 06/20/16  9:02 PM  Result Value Ref Range   MRSA by PCR NEGATIVE NEGATIVE    Comment:        The GeneXpert MRSA Assay (FDA approved for NASAL specimens only), is one component of a comprehensive MRSA colonization surveillance program. It is not intended to diagnose MRSA infection nor to guide or monitor treatment for MRSA infections.   CBC      Status: Abnormal   Collection Time: 06/20/16  9:44 PM  Result Value Ref Range   WBC 22.2 (H) 4.0 - 10.5 K/uL   RBC 5.29 4.22 - 5.81 MIL/uL   Hemoglobin 15.2 13.0 - 17.0 g/dL   HCT 45.5 39.0 - 52.0 %   MCV 86.0 78.0 - 100.0 fL   MCH 28.7 26.0 - 34.0 pg   MCHC 33.4 30.0 - 36.0 g/dL   RDW 13.8 11.5 - 15.5 %  Platelets 269 150 - 400 K/uL  Creatinine, serum     Status: Abnormal   Collection Time: 06/20/16  9:44 PM  Result Value Ref Range   Creatinine, Ser 1.34 (H) 0.61 - 1.24 mg/dL   GFR calc non Af Amer 56 (L) >60 mL/min   GFR calc Af Amer >60 >60 mL/min    Comment: (NOTE) The eGFR has been calculated using the CKD EPI equation. This calculation has not been validated in all clinical situations. eGFR's persistently <60 mL/min signify possible Chronic Kidney Disease.   Troponin I     Status: Abnormal   Collection Time: 06/20/16  9:44 PM  Result Value Ref Range   Troponin I 3.63 (HH) <0.03 ng/mL    Comment: CRITICAL RESULT CALLED TO, READ BACK BY AND VERIFIED WITH: ROUSE S,RN 06/20/16 2235 WAYK   TSH     Status: None   Collection Time: 06/20/16  9:44 PM  Result Value Ref Range   TSH 1.443 0.350 - 4.500 uIU/mL  Brain natriuretic peptide     Status: None   Collection Time: 06/20/16 10:04 PM  Result Value Ref Range   B Natriuretic Peptide 40.8 0.0 - 100.0 pg/mL  Troponin I     Status: Abnormal   Collection Time: 06/21/16  3:15 AM  Result Value Ref Range   Troponin I 17.51 (HH) <0.03 ng/mL    Comment: CRITICAL VALUE NOTED.  VALUE IS CONSISTENT WITH PREVIOUSLY REPORTED AND CALLED VALUE.  Basic metabolic panel     Status: Abnormal   Collection Time: 06/21/16  3:15 AM  Result Value Ref Range   Sodium 141 135 - 145 mmol/L   Potassium 3.9 3.5 - 5.1 mmol/L    Comment: DELTA CHECK NOTED   Chloride 111 101 - 111 mmol/L   CO2 23 22 - 32 mmol/L   Glucose, Bld 186 (H) 65 - 99 mg/dL   BUN 13 6 - 20 mg/dL   Creatinine, Ser 1.18 0.61 - 1.24 mg/dL   Calcium 8.9 8.9 - 10.3 mg/dL   GFR  calc non Af Amer >60 >60 mL/min   GFR calc Af Amer >60 >60 mL/min    Comment: (NOTE) The eGFR has been calculated using the CKD EPI equation. This calculation has not been validated in all clinical situations. eGFR's persistently <60 mL/min signify possible Chronic Kidney Disease.    Anion gap 7 5 - 15  CBC     Status: Abnormal   Collection Time: 06/21/16  3:15 AM  Result Value Ref Range   WBC 15.2 (H) 4.0 - 10.5 K/uL   RBC 5.06 4.22 - 5.81 MIL/uL   Hemoglobin 14.6 13.0 - 17.0 g/dL   HCT 43.7 39.0 - 52.0 %   MCV 86.4 78.0 - 100.0 fL   MCH 28.9 26.0 - 34.0 pg   MCHC 33.4 30.0 - 36.0 g/dL   RDW 14.1 11.5 - 15.5 %   Platelets 230 150 - 400 K/uL    Imaging: Dg Chest Port 1 View  06/20/2016  CLINICAL DATA:  ST elevation myocardial infarction. EXAM: PORTABLE CHEST 1 VIEW COMPARISON:  03/09/2016 FINDINGS: Stable normal heart size. Stable aortic and hilar contours. Diffuse interstitial coarsening with asymmetric right-sided airspace opacity. IMPRESSION: Interstitial opacities and right asymmetric airspace disease, asymmetric edema given the history. No visible fracture or pneumothorax from prior CPR. Electronically Signed   By: Monte Fantasia M.D.   On: 06/20/2016 23:42    Assessment:  Principal Problem:   ST elevation (STEMI) myocardial infarction involving  left anterior descending coronary artery [facility]) Active Problems:   Hyperlipidemia   Essential hypertension   CAD (coronary artery disease)   Plan:  1. Doing much better today after PCI to the proximal LAD yesterday. Eating breakfast this morning. Labs reviewed, surprisingly LDL cholesterol is 70 - was on Lipitor 80 mg at home. Will add Zetia for additional LDL benefit. A1c 6.3. Appears to have converted to sinus this morning. Will continue amiodarone today and possibly stop tomorrow. Keep in ICU today and possible transfer to tele bed tomorrow. Anticipate d/c Monday.  Time Spent Directly with Patient:  30 minutes  Length of  Stay:  LOS: 1 day   Pixie Casino, MD, [facility] Attending Cardiologist [place] 06/21/2016, 8:41 AM

## 2016-06-21 NOTE — Progress Notes (Signed)
Received order for cardiac rehab phase I s/p stemi and stent -7/21.  Pt oriented to cardiac rehab phase I and gave heart attack booklet. Pt instructed on outpatient cardiac rehab. Pt lives in [place] and was given information on both [facility] and [facility].  Pt given brochure on [place] [facility] [place].  Will refer to both and let pt decide.  Advised by Dr.HIlty not to ambulate today, possible tomorrow. Will follow up with pt on Monday and progress activity when appropriate. Cherre Huger, BSN [phone]

## 2016-06-21 NOTE — Progress Notes (Signed)
Troponin 17.51 resulted. Expected lab trend post-cath.

## 2016-06-22 ENCOUNTER — Inpatient Hospital Stay (HOSPITAL_COMMUNITY): Payer: BC Managed Care – PPO

## 2016-06-22 LAB — BASIC METABOLIC PANEL
ANION GAP: 4 — AB (ref 5–15)
BUN: 20 mg/dL (ref 6–20)
CALCIUM: 8.8 mg/dL — AB (ref 8.9–10.3)
CO2: 29 mmol/L (ref 22–32)
CREATININE: 1.17 mg/dL (ref 0.61–1.24)
Chloride: 104 mmol/L (ref 101–111)
GFR calc Af Amer: >60 mL/min (ref >60)
GLUCOSE: 147 mg/dL — AB (ref 65–99)
Potassium: 4.2 mmol/L (ref 3.5–5.1)
Sodium: 137 mmol/L (ref 135–145)

## 2016-06-22 LAB — TROPONIN I: Troponin I: 10.14 ng/mL (ref <0.03)

## 2016-06-22 MED ORDER — POLYETHYLENE GLYCOL 3350 17 G PO PACK
17.0000 g | PACK | Freq: Once | ORAL | Status: AC
  Administered 2016-06-22: 17 g via ORAL
  Filled 2016-06-22: qty 1

## 2016-06-22 MED ORDER — OXYCODONE-ACETAMINOPHEN 5-325 MG PO TABS
1.0000 | ORAL_TABLET | Freq: Four times a day (QID) | ORAL | Status: DC | PRN
Start: 2016-06-22 — End: 2016-06-23

## 2016-06-22 MED ORDER — FUROSEMIDE 10 MG/ML IJ SOLN
40.0000 mg | Freq: Two times a day (BID) | INTRAMUSCULAR | Status: AC
  Administered 2016-06-22 (×2): 40 mg via INTRAVENOUS
  Filled 2016-06-22: qty 4

## 2016-06-22 MED ORDER — FUROSEMIDE 10 MG/ML IJ SOLN
INTRAMUSCULAR | Status: AC
  Filled 2016-06-22: qty 4

## 2016-06-22 MED ORDER — TRAMADOL HCL 50 MG PO TABS
50.0000 mg | ORAL_TABLET | Freq: Two times a day (BID) | ORAL | Status: DC | PRN
  Administered 2016-06-22: 50 mg via ORAL
  Filled 2016-06-22: qty 1

## 2016-06-22 NOTE — Progress Notes (Signed)
Pt arrived to 2W from [facility]. Pt placed on telemetry and CCMD notified.  Pt and family oriented to floor including call light, telephone and visiting hours.  Discussed Pt plan with Pt and wife.  Pt denies chest pain at this time, requests that pain medication only be brought when he asks for it.  Education provided on not letting pain get out of control before asking for pain medication.  Pt indicates understanding.  Will cont to monitor.

## 2016-06-22 NOTE — Progress Notes (Signed)
DAILY PROGRESS NOTE  Subjective:  Chills/sweats overnight - no fever, cough, dysuria. Echo yesterday shows LVEF has recovered to 55-60%. LV filling pressures are elevated with grade 2 DD, RVSP is 42 mmHg. He is about 3.5L positive.  Objective:  Temp:  [97.4 F (36.3 C)-98.7 F (37.1 C)] 98.7 F (37.1 C) (07/23 0800) Pulse Rate:  [57-69] 65 (07/23 0700) Resp:  [14-30] 20 (07/23 0800) BP: (99-132)/(55-92) 105/70 (07/23 0700) SpO2:  [91 %-99 %] 94 % (07/23 0700) Weight change:   Intake/Output from previous day: 07/22 0701 - 07/23 0700 In: 1290.4 [P.O.:720; I.V.:570.4] Out: 400 [Urine:400]  Intake/Output from this shift: Total I/O In: 16.7 [I.V.:16.7] Out: -   Medications: Current Facility-Administered Medications  Medication Dose Route Frequency Provider Last Rate Last Dose  . 0.9 %  sodium chloride infusion  250 mL Intravenous PRN Sherren Mocha, MD   Stopped at 06/21/16 2000  . acetaminophen (TYLENOL) tablet 650 mg  650 mg Oral Q4H PRN Sherren Mocha, MD      . amiodarone (NEXTERONE PREMIX) 360-4.14 MG/200ML-% (1.8 mg/mL) IV infusion  30 mg/hr Intravenous Continuous Sherren Mocha, MD 16.7 mL/hr at 06/22/16 0548 30 mg/hr at 06/22/16 0548  . aspirin EC tablet 81 mg  81 mg Oral Daily Sherren Mocha, MD   81 mg at 06/21/16 2248  . atorvastatin (LIPITOR) tablet 80 mg  80 mg Oral q1800 Sherren Mocha, MD   80 mg at 06/21/16 1743  . diazepam (VALIUM) tablet 5 mg  5 mg Oral Q6H PRN Sherren Mocha, MD      . ezetimibe (ZETIA) tablet 10 mg  10 mg Oral Daily Pixie Casino, MD   10 mg at 06/21/16 0917  . heparin injection 5,000 Units  5,000 Units Subcutaneous Q8H Jake Church Masters, [facility]   5,000 Units at 06/22/16 [phone]  . morphine 2 MG/ML injection 2 mg  2 mg Intravenous Q1H PRN Sherren Mocha, MD      . nitroGLYCERIN (NITROSTAT) SL tablet 0.4 mg  0.4 mg Sublingual Q5 Min x 3 PRN Sherren Mocha, MD      . norepinephrine (LEVOPHED) 4 mg in dextrose 5 % 250 mL (0.016 mg/mL) infusion   0-40 mcg/min Intravenous Titrated Sherren Mocha, MD   Stopped at 06/20/16 2215  . ondansetron (ZOFRAN) injection 4 mg  4 mg Intravenous Q6H PRN Sherren Mocha, MD   4 mg at 06/22/16 3704  . oxyCODONE-acetaminophen (PERCOCET/ROXICET) 5-325 MG per tablet 1-2 tablet  1-2 tablet Oral Q4H PRN Sherren Mocha, MD   2 tablet at 06/22/16 0550  . sodium chloride flush (NS) 0.9 % injection 3 mL  3 mL Intravenous Q12H Sherren Mocha, MD   3 mL at 06/21/16 2111  . sodium chloride flush (NS) 0.9 % injection 3 mL  3 mL Intravenous PRN Sherren Mocha, MD      . ticagrelor [facility]) tablet 90 mg  90 mg Oral BID Sherren Mocha, MD   90 mg at 06/21/16 2111    Physical Exam: General appearance: alert, no distress and mild chest soreness Neck: no carotid bruit and no JVD Lungs: diminished breath sounds bibasilar and dullness to percussion RLL Heart: regular rate and rhythm Abdomen: soft, non-tender; bowel sounds normal; no masses,  no organomegaly Extremities: extremities normal, atraumatic, no cyanosis or edema Pulses: 2+ and symmetric Skin: Skin color, texture, turgor normal. No rashes or lesions Neurologic: Grossly normal Psych: Flat affect  Lab Results: Results for orders placed or performed during the hospital encounter of 06/20/16 (from  the past 48 hour(s))  Comprehensive metabolic panel     Status: Abnormal   Collection Time: 06/20/16  7:38 PM  Result Value Ref Range   Sodium 143 135 - 145 mmol/L   Potassium 3.1 (L) 3.5 - 5.1 mmol/L   Chloride 111 101 - 111 mmol/L   CO2 17 (L) 22 - 32 mmol/L   Glucose, Bld 200 (H) 65 - 99 mg/dL   BUN 16 6 - 20 mg/dL   Creatinine, Ser 1.53 (H) 0.61 - 1.24 mg/dL   Calcium 9.2 8.9 - 10.3 mg/dL   Total Protein 6.8 6.5 - 8.1 g/dL   Albumin 3.8 3.5 - 5.0 g/dL   AST 27 15 - 41 U/L   ALT 28 17 - 63 U/L   Alkaline Phosphatase 56 38 - 126 U/L   Total Bilirubin 1.0 0.3 - 1.2 mg/dL   GFR calc non Af Amer 48 (L) >60 mL/min   GFR calc Af Amer 55 (L) >60 mL/min     Comment: (NOTE) The eGFR has been calculated using the CKD EPI equation. This calculation has not been validated in all clinical situations. eGFR's persistently <60 mL/min signify possible Chronic Kidney Disease.    Anion gap 15 5 - 15  Lipid panel     Status: Abnormal   Collection Time: 06/20/16  7:38 PM  Result Value Ref Range   Cholesterol 120 0 - 200 mg/dL   Triglycerides 88 <150 mg/dL   HDL 32 (L) >40 mg/dL   Total CHOL/HDL Ratio 3.8 RATIO   VLDL 18 0 - 40 mg/dL   LDL Cholesterol 70 0 - 99 mg/dL    Comment:        Total Cholesterol/HDL:CHD Risk Coronary Heart Disease Risk Table                     Men   Women  1/2 Average Risk   3.4   3.3  Average Risk       5.0   4.4  2 X Average Risk   9.6   7.1  3 X Average Risk  23.4   11.0        Use the calculated Patient Ratio above and the CHD Risk Table to determine the patient's CHD Risk.        ATP III CLASSIFICATION (LDL):  <100     mg/dL   Optimal  100-129  mg/dL   Near or Above                    Optimal  130-159  mg/dL   Borderline  160-189  mg/dL   High  >190     mg/dL   Very High   CK total and CKMB (cardiac)not at [facility]     Status: None   Collection Time: 06/20/16  7:38 PM  Result Value Ref Range   Total CK 98 49 - 397 U/L   CK, MB 2.2 0.5 - 5.0 ng/mL   Relative Index RELATIVE INDEX IS INVALID 0.0 - 2.5    Comment: WHEN CK < 100 U/L          Hemoglobin A1c     Status: Abnormal   Collection Time: 06/20/16  7:38 PM  Result Value Ref Range   Hgb A1c MFr Bld 6.3 (H) 4.8 - 5.6 %    Comment: (NOTE)         Pre-diabetes: 5.7 - 6.4         Diabetes: >  6.4         Glycemic control for adults with diabetes: <7.0    Mean Plasma Glucose 134 mg/dL    Comment: (NOTE) Performed At: [facility] [place], [place] [id number] Lindon Romp MD OI:[id number]   CBC     Status: Abnormal   Collection Time: 06/20/16  7:38 PM  Result Value Ref Range   WBC 22.0 (H) 4.0 - 10.5 K/uL   RBC 5.14 4.22 -  5.81 MIL/uL   Hemoglobin 14.7 13.0 - 17.0 g/dL   HCT 43.7 39.0 - 52.0 %   MCV 85.0 78.0 - 100.0 fL   MCH 28.6 26.0 - 34.0 pg   MCHC 33.6 30.0 - 36.0 g/dL   RDW 13.7 11.5 - 15.5 %   Platelets 272 150 - 400 K/uL  Protime-INR     Status: Abnormal   Collection Time: 06/20/16  7:38 PM  Result Value Ref Range   Prothrombin Time 15.8 (H) 11.6 - 15.2 seconds   INR 1.25 0.00 - 1.49  APTT     Status: Abnormal   Collection Time: 06/20/16  7:38 PM  Result Value Ref Range   aPTT 91 (H) 24 - 37 seconds    Comment:        IF BASELINE aPTT IS ELEVATED, SUGGEST PATIENT RISK ASSESSMENT BE USED TO DETERMINE APPROPRIATE ANTICOAGULANT THERAPY.   I-STAT, chem 8     Status: Abnormal   Collection Time: 06/20/16  7:38 PM  Result Value Ref Range   Sodium 141 135 - 145 mmol/L   Potassium 3.1 (L) 3.5 - 5.1 mmol/L   Chloride 123 (H) 101 - 111 mmol/L   BUN 16 6 - 20 mg/dL   Creatinine, Ser 1.40 (H) 0.61 - 1.24 mg/dL   Glucose, Bld 201 (H) 65 - 99 mg/dL   Calcium, Ion 1.01 (L) 1.12 - 1.23 mmol/L   TCO2 18 0 - 100 mmol/L   Hemoglobin 14.6 13.0 - 17.0 g/dL   HCT 43.0 39.0 - 52.0 %  POCT Activated clotting time     Status: None   Collection Time: 06/20/16  7:49 PM  Result Value Ref Range   Activated Clotting Time 246 seconds  MRSA PCR Screening     Status: None   Collection Time: 06/20/16  9:02 PM  Result Value Ref Range   MRSA by PCR NEGATIVE NEGATIVE    Comment:        The GeneXpert MRSA Assay (FDA approved for NASAL specimens only), is one component of a comprehensive MRSA colonization surveillance program. It is not intended to diagnose MRSA infection nor to guide or monitor treatment for MRSA infections.   CBC     Status: Abnormal   Collection Time: 06/20/16  9:44 PM  Result Value Ref Range   WBC 22.2 (H) 4.0 - 10.5 K/uL   RBC 5.29 4.22 - 5.81 MIL/uL   Hemoglobin 15.2 13.0 - 17.0 g/dL   HCT 45.5 39.0 - 52.0 %   MCV 86.0 78.0 - 100.0 fL   MCH 28.7 26.0 - 34.0 pg   MCHC 33.4 30.0 -  36.0 g/dL   RDW 13.8 11.5 - 15.5 %   Platelets 269 150 - 400 K/uL  Creatinine, serum     Status: Abnormal   Collection Time: 06/20/16  9:44 PM  Result Value Ref Range   Creatinine, Ser 1.34 (H) 0.61 - 1.24 mg/dL   GFR calc non Af Amer 56 (L) >60 mL/min   GFR calc  Af Amer >60 >60 mL/min    Comment: (NOTE) The eGFR has been calculated using the CKD EPI equation. This calculation has not been validated in all clinical situations. eGFR's persistently <60 mL/min signify possible Chronic Kidney Disease.   Troponin I     Status: Abnormal   Collection Time: 06/20/16  9:44 PM  Result Value Ref Range   Troponin I 3.63 (HH) <0.03 ng/mL    Comment: CRITICAL RESULT CALLED TO, READ BACK BY AND VERIFIED WITH: ROUSE S,RN 06/20/16 2235 WAYK   TSH     Status: None   Collection Time: 06/20/16  9:44 PM  Result Value Ref Range   TSH 1.443 0.350 - 4.500 uIU/mL  Brain natriuretic peptide     Status: None   Collection Time: 06/20/16 10:04 PM  Result Value Ref Range   B Natriuretic Peptide 40.8 0.0 - 100.0 pg/mL  Troponin I     Status: Abnormal   Collection Time: 06/21/16  3:15 AM  Result Value Ref Range   Troponin I 17.51 (HH) <0.03 ng/mL    Comment: CRITICAL VALUE NOTED.  VALUE IS CONSISTENT WITH PREVIOUSLY REPORTED AND CALLED VALUE.  Basic metabolic panel     Status: Abnormal   Collection Time: 06/21/16  3:15 AM  Result Value Ref Range   Sodium 141 135 - 145 mmol/L   Potassium 3.9 3.5 - 5.1 mmol/L    Comment: DELTA CHECK NOTED   Chloride 111 101 - 111 mmol/L   CO2 23 22 - 32 mmol/L   Glucose, Bld 186 (H) 65 - 99 mg/dL   BUN 13 6 - 20 mg/dL   Creatinine, Ser 1.18 0.61 - 1.24 mg/dL   Calcium 8.9 8.9 - 10.3 mg/dL   GFR calc non Af Amer >60 >60 mL/min   GFR calc Af Amer >60 >60 mL/min    Comment: (NOTE) The eGFR has been calculated using the CKD EPI equation. This calculation has not been validated in all clinical situations. eGFR's persistently <60 mL/min signify possible Chronic  Kidney Disease.    Anion gap 7 5 - 15  CBC     Status: Abnormal   Collection Time: 06/21/16  3:15 AM  Result Value Ref Range   WBC 15.2 (H) 4.0 - 10.5 K/uL   RBC 5.06 4.22 - 5.81 MIL/uL   Hemoglobin 14.6 13.0 - 17.0 g/dL   HCT 43.7 39.0 - 52.0 %   MCV 86.4 78.0 - 100.0 fL   MCH 28.9 26.0 - 34.0 pg   MCHC 33.4 30.0 - 36.0 g/dL   RDW 14.1 11.5 - 15.5 %   Platelets 230 150 - 400 K/uL  Troponin I     Status: Abnormal   Collection Time: 06/21/16 10:09 AM  Result Value Ref Range   Troponin I 21.64 (HH) <0.03 ng/mL    Comment: CRITICAL VALUE NOTED.  VALUE IS CONSISTENT WITH PREVIOUSLY REPORTED AND CALLED VALUE.  Basic metabolic panel     Status: Abnormal   Collection Time: 06/22/16  2:44 AM  Result Value Ref Range   Sodium 137 135 - 145 mmol/L   Potassium 4.2 3.5 - 5.1 mmol/L    Comment: SPECIMEN HEMOLYZED. HEMOLYSIS MAY AFFECT INTEGRITY OF RESULTS.   Chloride 104 101 - 111 mmol/L   CO2 29 22 - 32 mmol/L   Glucose, Bld 147 (H) 65 - 99 mg/dL   BUN 20 6 - 20 mg/dL   Creatinine, Ser 1.17 0.61 - 1.24 mg/dL   Calcium 8.8 (L) 8.9 - 10.3  mg/dL   GFR calc non Af Amer >60 >60 mL/min   GFR calc Af Amer >60 >60 mL/min    Comment: (NOTE) The eGFR has been calculated using the CKD EPI equation. This calculation has not been validated in all clinical situations. eGFR's persistently <60 mL/min signify possible Chronic Kidney Disease.    Anion gap 4 (L) 5 - 15  Troponin I     Status: Abnormal   Collection Time: 06/22/16  2:44 AM  Result Value Ref Range   Troponin I 10.14 (HH) <0.03 ng/mL    Comment: CRITICAL VALUE NOTED.  VALUE IS CONSISTENT WITH PREVIOUSLY REPORTED AND CALLED VALUE.    Imaging: Dg Chest Port 1 View  Result Date: 06/20/2016 CLINICAL DATA:  ST elevation myocardial infarction. EXAM: PORTABLE CHEST 1 VIEW COMPARISON:  03/09/2016 FINDINGS: Stable normal heart size. Stable aortic and hilar contours. Diffuse interstitial coarsening with asymmetric right-sided airspace opacity.  IMPRESSION: Interstitial opacities and right asymmetric airspace disease, asymmetric edema given the history. No visible fracture or pneumothorax from prior CPR. Electronically Signed   By: Monte Fantasia M.D.   On: 06/20/2016 23:42    Assessment:  Principal Problem:   ST elevation (STEMI) myocardial infarction involving left anterior descending coronary artery [facility]) Active Problems:   Hyperlipidemia   Essential hypertension   CAD (coronary artery disease)   Plan:  1. D/c amiodarone. Give lasix 40 mg IV x 2 doses. Ok to ambulate today with cardiac rehab. Reports constipation, will give miralax x 1 today. Check portable CXR due to chills/sweats and early opacity on CXR 2 days ago - there is dullness in the right base, r/o early pneumonia - although leukocytosis has been resolving. Check CBC in am. Anticipate possible d/c tomorrow if there are not complications. Ok to transfer to telemetry floor today.  Time Spent Directly with Patient:  30 minutes  Length of Stay:  LOS: 2 days   Pixie Casino, MD, [facility] Attending Cardiologist [place] 06/22/2016, 8:39 AM

## 2016-06-22 NOTE — Progress Notes (Signed)
Report called to receiving rn [id number]. Patient with no complaints at the current time. Will transfer via [place].

## 2016-06-22 NOTE — Progress Notes (Signed)
Utilization review completed.  

## 2016-06-23 ENCOUNTER — Encounter (HOSPITAL_COMMUNITY): Payer: Self-pay | Admitting: Physician Assistant

## 2016-06-23 ENCOUNTER — Telehealth: Payer: Self-pay | Admitting: *Deleted

## 2016-06-23 DIAGNOSIS — I25110 Atherosclerotic heart disease of native coronary artery with unstable angina pectoris: Secondary | ICD-10-CM

## 2016-06-23 DIAGNOSIS — I4891 Unspecified atrial fibrillation: Secondary | ICD-10-CM | POA: Diagnosis present

## 2016-06-23 LAB — BASIC METABOLIC PANEL
Anion gap: 8 (ref 5–15)
Anion gap: 13 (ref 5–15)
BUN: 15 mg/dL (ref 6–20)
BUN: 19 mg/dL (ref 6–20)
CALCIUM: 8.7 mg/dL — AB (ref 8.9–10.3)
CO2: 30 mmol/L (ref 22–32)
CO2: 22 mmol/L (ref 22–32)
CREATININE: 1.04 mg/dL (ref 0.61–1.24)
Calcium: 8.8 mg/dL — ABNORMAL LOW (ref 8.9–10.3)
Chloride: 101 mmol/L (ref 101–111)
Chloride: 102 mmol/L (ref 101–111)
Creatinine, Ser: 1.08 mg/dL (ref 0.61–1.24)
GFR calc Af Amer: >60 mL/min (ref >60)
GFR calc Af Amer: >60 mL/min (ref >60)
GLUCOSE: 121 mg/dL — AB (ref 65–99)
GLUCOSE: 125 mg/dL — AB (ref 65–99)
POTASSIUM: 3 mmol/L — AB (ref 3.5–5.1)
POTASSIUM: 3.9 mmol/L (ref 3.5–5.1)
SODIUM: 139 mmol/L (ref 135–145)
Sodium: 137 mmol/L (ref 135–145)

## 2016-06-23 LAB — CBC
HCT: 41.5 % (ref 39.0–52.0)
Hemoglobin: 13.5 g/dL (ref 13.0–17.0)
MCH: 28 pg (ref 26.0–34.0)
MCHC: 32.5 g/dL (ref 30.0–36.0)
MCV: 86.1 fL (ref 78.0–100.0)
PLATELETS: 203 10*3/uL (ref 150–400)
RBC: 4.82 MIL/uL (ref 4.22–5.81)
RDW: 13.8 % (ref 11.5–15.5)
WBC: 9.7 10*3/uL (ref 4.0–10.5)

## 2016-06-23 LAB — HEMOGLOBIN A1C
HEMOGLOBIN A1C: 6.3 % — AB (ref 4.8–5.6)
Mean Plasma Glucose: 134 mg/dL

## 2016-06-23 MED ORDER — TICAGRELOR 90 MG PO TABS: 90.0000 mg | ORAL_TABLET | Freq: Two times a day (BID) | ORAL | 3 refills | Status: DC

## 2016-06-23 MED ORDER — TICAGRELOR 90 MG PO TABS: 90.0000 mg | ORAL_TABLET | Freq: Two times a day (BID) | ORAL | 0 refills | Status: DC

## 2016-06-23 MED ORDER — POTASSIUM CHLORIDE CRYS ER 20 MEQ PO TBCR
40.0000 meq | EXTENDED_RELEASE_TABLET | ORAL | Status: AC
  Administered 2016-06-23 (×2): 40 meq via ORAL
  Filled 2016-06-23 (×2): qty 2

## 2016-06-23 MED ORDER — NITROGLYCERIN 0.4 MG SL SUBL: 0.4000 mg | SUBLINGUAL_TABLET | SUBLINGUAL | 3 refills | Status: DC | PRN

## 2016-06-23 MED ORDER — EZETIMIBE 10 MG PO TABS: 10.0000 mg | ORAL_TABLET | Freq: Every day | ORAL | 3 refills | Status: DC

## 2016-06-23 MED ORDER — ATORVASTATIN CALCIUM 80 MG PO TABS: 80.0000 mg | ORAL_TABLET | Freq: Every day | ORAL | 3 refills | Status: DC

## 2016-06-23 NOTE — Telephone Encounter (Signed)
-----   Message from Blain Pais sent at 06/23/2016  2:42 PM EDT ----- Regarding: tcm/ph 7/31 2:15 Dr. Fletcher Anon

## 2016-06-23 NOTE — Discharge Summary (Signed)
Discharge Summary    Patient ID: Matthew Doyle,  MRN: [id number], DOB/AGE: August 19, 1956 60 y.o.  Admit date: 06/20/2016 Discharge date: 06/23/2016  Primary Care Provider: Viviana Doyle Primary Cardiologist: Dr. Fletcher Anon  Discharge Diagnoses    Principal Problem:   ST elevation (STEMI) myocardial infarction involving left anterior descending coronary artery [facility]) Active Problems:   Hyperlipidemia   Essential hypertension   CAD (coronary artery disease)   Atrial fibrillation (HCC) post MI   Allergies Allergies  Allergen Reactions  . Lisinopril Other (See Comments)    REACTION: cough with this and/or benazepril    Diagnostic Studies/Procedures    Cath 06/20/2016 Conclusion    Prox RCA lesion, 40% stenosed.  RPDA lesion, 99% stenosed.  Mid LAD lesion, 25% stenosed. The lesion was previously treated with a drug-eluting stent greater than two years ago.  There is mild left ventricular systolic dysfunction.  Prox LAD lesion, 99% stenosed. Post intervention, there is a 0% residual stenosis.  Ost 1st Diag to 1st Diag lesion, 99% stenosed. Post intervention, there is a 80% residual stenosis.  Prox Cx to Mid Cx lesion, 90% stenosed. Post intervention, there is a 10% residual stenosis.   1. Acute anterolateral STEMI secondary to thrombotic occlusion of the proximal LAD/first diagonal, treated successfully with primary PCI using a drug-eluting stent in the LAD and aspiration thrombectomy in the diagonal  2. Thrombotic stenosis of the mid left circumflex treated successfully with aspiration thrombectomy  3. Patency of the RCA with subtotal occlusion of the PDA branch, unchanged from previous catheterization studies  4. Mild segmental LV systolic dysfunction consistent with acute anteroapical infarct, LVEF estimated at 45%  Recommendations: The patient will be transferred to the ICU because of acute STEMI complicated by cardiogenic shock. He is requiring low-dose levo  fed at present. He is requiring IV amiodarone for treatment of atrial fibrillation with RVR. Will continue Aggrastat for 18 hours. Will load him with brilinta 180 mg (this was attempted in the cath lab but he vomited immediately after taking it). Initiate post-MI medical therapy.    Echo 06/21/2016 LV EF: 55% -   60%  ------------------------------------------------------------------- Indications:      Chest pain 786.51.  ------------------------------------------------------------------- History:   PMH:  STEMI involving LAD. Hyperlipidemia. Essential hypertension. CAD. Cardiac arrest.  ------------------------------------------------------------------- Study Conclusions  - Left ventricle: The cavity size was normal. Wall thickness was   normal. Systolic function was normal. The estimated ejection   fraction was in the range of 55% to 60%. Features are consistent   with a pseudonormal left ventricular filling pattern, with   concomitant abnormal relaxation and increased filling pressure   (grade 2 diastolic dysfunction). - Left atrium: The atrium was mildly dilated. - Pulmonary arteries: PA peak pressure: 42 mm Hg (S). _____________   History of Present Illness     60 y.o. male w/ PMHx significant for CAD and prior PCI who presented to [facility] on 06/20/2016 with complaints of chest pain.  He is brought directly to the cath lab as a Code STEMI from [place].   The patient has a history of coronary artery disease. He's had 2 previous MIs. He was treated with stenting of the mid LAD with a Cypher DES in 2004. He was treated with stenting of the first obtuse marginal with a Promus DES in 2014. He had nonobstructive disease elsewhere and a patent stent in the LAD at the time of his last heart catheterization.  The patient was in  his normal state of health today. He was working as a Building control surveyor. He developed sudden onset of severe chest pain approximately 90 minutes ago. EMS  was called and his initial EKG demonstrated an anterior lateral STEMI. A code STEMI was called. The patient was in route when he went into ventricular fibrillation. The paramedics had to pull [place] side of [place] to resuscitate him. He received about 2 minutes of CPR and a single defibrillation. He received aspirin 324 mg and fentanyl 150 g and route. On arrival here he is awake and alert. He complains of 9/10 chest pain as well as shortness of breath. No other complaints.  Hospital Course     Patient underwent emergent cardiac catheterization on 06/20/2016 which showed 99% RPDA, 40% prox RCA, 99% prox LAD treated with DES, 99% ost D1 treated with aspiration thrombectomy reducing to 80% residual, 90% prox LCx treated with aspiration thrombectomy reducing to 10%,  EF 45%. Postprocedure, she was on levophed for short periods of time due to cardiogenic shock, however this eventually improved. Patient was eventually transferred from ICU to telemetry on 7/23. Echocardiogram obtained on 06/21/2016 showed EF 0000000, grade 2 diastolic dysfunction, PA peak pressure 42 mmHg. His Lipid panel shows LDL well controlled, given the degree of CAD, Zetia was added.  Patient was seen in the morning of 06/23/2016, at which time he denies any chest pain or shortness breath. He has been ambulating without significant discomfort. He does have low potassium after 2 doses of IV Lasix. He appears to be euvolemic on physical seen. He was repleted with potassium supplement, his repeat K was 3.9. He is deemed stable for discharge. I have arranged a seven-day transition of care follow-up in our [place] office.  _____________  Discharge Vitals Blood pressure 118/74, pulse 71, temperature 99 F (37.2 C), temperature source Oral, resp. rate 20, height 6' (1.829 m), weight 242 lb 8.1 oz (110 kg), SpO2 98 %.  Filed Weights   06/20/16 2100  Weight: 242 lb 8.1 oz (110 kg)    Labs & Radiologic Studies     CBC  Recent  Labs  06/21/16 0315 06/23/16 0318  WBC 15.2* 9.7  HGB 14.6 13.5  HCT 43.7 41.5  MCV 86.4 86.1  PLT 230 123456   Basic Metabolic Panel  Recent Labs  06/23/16 0318 06/23/16 1428  NA 139 137  K 3.0* 3.9  CL 101 102  CO2 30 22  GLUCOSE 121* 125*  BUN 15 19  CREATININE 1.04 1.08  CALCIUM 8.7* 8.8*   Liver Function Tests  Recent Labs  06/20/16 1938  AST 27  ALT 28  ALKPHOS 56  BILITOT 1.0  PROT 6.8  ALBUMIN 3.8   No results for input(s): LIPASE, AMYLASE in the last 72 hours. Cardiac Enzymes  Recent Labs  06/20/16 1938  06/21/16 0315 06/21/16 1009 06/22/16 0244  CKTOTAL 98  --   --   --   --   CKMB 2.2  --   --   --   --   TROPONINI  --   < > 17.51* 21.64* 10.14*  < > = values in this interval not displayed. BNP Invalid input(s): POCBNP D-Dimer No results for input(s): DDIMER in the last 72 hours. Hemoglobin A1C  Recent Labs  06/20/16 2144  HGBA1C 6.3*   Fasting Lipid Panel  Recent Labs  06/20/16 1938  CHOL 120  HDL 32*  LDLCALC 70  TRIG 88  CHOLHDL 3.8   Thyroid Function Tests  Recent Labs  06/20/16 2144  TSH 1.443    Dg Chest Port 1 View  Result Date: 06/22/2016 CLINICAL DATA:  Pt arrived as a STEMI on Friday. In MD notes: Check portable CXR due to chills/sweats and early opacity on CXR 2 days ago - there is dullness in the right base, r/o early pneumonia - although leukocytosis has been resolving. Hx HTN EXAM: PORTABLE CHEST 1 VIEW COMPARISON:  06/20/2016 FINDINGS: Midline trachea. Cardiomegaly accentuated by AP portable technique. No pleural effusion or pneumothorax. No congestive failure. Resolved interstitial and right upper lobe airspace disease. Developing left infrahilar and lower lobe relatively linear opacity. IMPRESSION: Improved aeration with resolved interstitial edema and presumed right upper lobe alveolar edema. Developing left base and infrahilar opacity, favoring atelectasis. Recommend attention on follow-up. Electronically  Signed   By: Abigail Miyamoto M.D.   On: 06/22/2016 12:20  Dg Chest Port 1 View  Result Date: 06/20/2016 CLINICAL DATA:  ST elevation myocardial infarction. EXAM: PORTABLE CHEST 1 VIEW COMPARISON:  03/09/2016 FINDINGS: Stable normal heart size. Stable aortic and hilar contours. Diffuse interstitial coarsening with asymmetric right-sided airspace opacity. IMPRESSION: Interstitial opacities and right asymmetric airspace disease, asymmetric edema given the history. No visible fracture or pneumothorax from prior CPR. Electronically Signed   By: Monte Fantasia M.D.   On: 06/20/2016 23:42    Disposition   Pt is being discharged home today in good condition.  Follow-up Plans & Appointments    Follow-up Information    [name], MD Follow up on 06/30/2016.   Specialty:  Cardiology Why:  2:15PM. Cardiology office visit.  Contact information: [place] [address] [phone]        Matthew Simpler, MD. Schedule an appointment as soon as possible for a visit today.   Specialties:  Internal Medicine, Pediatrics Contact information: [place] [address] [phone]          Discharge Instructions    Amb Referral to Cardiac Rehabilitation    Complete by:  As directed   Diagnosis:   Coronary Stents Comment - also sending to [facility] STEMI     Amb Referral to Cardiac Rehabilitation    Complete by:  As directed   Diagnosis:   STEMI Comment - also sending to [facility] Coronary Stents     Diet - low sodium heart healthy    Complete by:  As directed   Discharge instructions    Complete by:  As directed   No driving for 24 hours. No lifting over 5 lbs for 1 week. No sexual activity for 1 week. Keep procedure site clean & dry. If you notice increased pain, swelling, bleeding or pus, call/return!  You may shower, but no soaking baths/hot tubs/pools for 1 week.   Increase activity slowly    Complete by:  As directed      Discharge Medications    Current Discharge Medication List    START taking these medications   Details  ezetimibe (ZETIA) 10 MG tablet Take 1 tablet (10 mg total) by mouth daily. Qty: 90 tablet, Refills: 3    ticagrelor (BRILINTA) 90 MG TABS tablet Take 1 tablet (90 mg total) by mouth 2 (two) times daily. Qty: 180 tablet, Refills: 3      CONTINUE these medications which have CHANGED   Details  atorvastatin (LIPITOR) 80 MG tablet Take 1 tablet (80 mg total) by mouth daily at 6 PM. Qty: 90 tablet, Refills: 3    nitroGLYCERIN (  NITROSTAT) 0.4 MG SL tablet Place 1 tablet (0.4 mg total) under the tongue every 5 (five) minutes as needed for chest pain. Qty: 25 tablet, Refills: 3      CONTINUE these medications which have NOT CHANGED   Details  aspirin 81 MG tablet Take 1 tablet (81 mg total) by mouth daily. Qty: 30 tablet    losartan (COZAAR) 100 MG tablet TAKE ONE TABLET BY MOUTH ONCE DAILY Qty: 90 tablet, Refills: 3      STOP taking these medications     amLODipine (NORVASC) 10 MG tablet          Aspirin prescribed at discharge?  Yes High Intensity Statin Prescribed? (Lipitor 40-80mg  or Crestor 20-40mg ): Yes Beta Blocker Prescribed? No: h/o bradycardia For EF 40% or less, Was ACEI/ARB Prescribed? Yes ADP Receptor Inhibitor Prescribed? (i.e. Plavix etc.-Includes Medically Managed Patients): Yes For EF <40%, Aldosterone Inhibitor Prescribed? No: EF normal Was EF assessed during THIS hospitalization? Yes Was Cardiac Rehab II ordered? (Included Medically managed Patients): Yes   Outstanding Labs/Studies   None  Duration of Discharge Encounter   Greater than 30 minutes including physician time.  Signed, Almyra Deforest PA-C 06/23/2016, 4:00 PM

## 2016-06-23 NOTE — Plan of Care (Signed)
Problem: Pain Managment: Goal: General experience of comfort will improve Outcome: Progressing Pt. resting well and not requiring pain med often.

## 2016-06-23 NOTE — Progress Notes (Addendum)
CARDIAC REHAB PHASE I   PRE:  Rate/Rhythm: 102 SR  BP:  Sitting: 121/77        SaO2: 99 RA  MODE:  Ambulation: 550 ft   POST:  Rate/Rhythm: 80 SR  BP:  Sitting: 139/80         SaO2: 96 RA  Pt ambulated 550 ft on RA, independent, steady gait, tolerated well with no complaints. Completed MI/stent education.  Reviewed risk factors, MI book, anti-platelet therapy, stent card, activity restrictions, ntg, exercise, heart healthy diet, sodium restrictions and phase 2 cardiac rehab. Pt verbalized understanding. Pt agrees to phase 2 cardiac rehab referral, referral sent to [facility] per pt request. Pt to see case manager regarding brilinta prior to discharge. Pt to recliner after walk, call bell within reach.  [phone] Lenna Sciara, RN, BSN 06/23/2016 9:16 AM

## 2016-06-23 NOTE — Telephone Encounter (Signed)
Left detailed voicemail message on patients cell phone per release form with instructions to call back if he has any questions regarding his discharge instructions or medications. Also left his follow up appointment information with Dr. Fletcher Anon 06/30/16 at 2:15 PM and instructions to call back if he has any further questions.

## 2016-06-23 NOTE — Progress Notes (Signed)
Patient Name: Matthew Doyle Date of Encounter: 06/23/2016  Primary Cardiologist: Dr. Fletcher Anon   Principal Problem:   ST elevation (STEMI) myocardial infarction involving left anterior descending coronary artery [facility]) Active Problems:   Hyperlipidemia   Essential hypertension   CAD (coronary artery disease)   Atrial fibrillation ([place]) post MI    SUBJECTIVE  Denies any CP or SOB  CURRENT MEDS . aspirin EC  81 mg Oral Daily  . atorvastatin  80 mg Oral q1800  . ezetimibe  10 mg Oral Daily  . heparin  5,000 Units Subcutaneous Q8H  . sodium chloride flush  3 mL Intravenous Q12H  . ticagrelor  90 mg Oral BID    OBJECTIVE  Vitals:   06/22/16 1500 06/22/16 1701 06/22/16 2019 06/23/16 0650  BP: 119/70 123/70 117/74 122/77  Pulse: 74 75 73 64  Resp: 19  20 20   Temp: 99.4 F (37.4 C) 99.6 F (37.6 C) 99.8 F (37.7 C) 98.9 F (37.2 C)  TempSrc: Oral  Oral Oral  SpO2: 94%  95% 96%  Weight:      Height:        Intake/Output Summary (Last 24 hours) at 06/23/16 0837 Last data filed at 06/22/16 0900  Gross per 24 hour  Intake              240 ml  Output                0 ml  Net              240 ml   Filed Weights   06/20/16 2100  Weight: 242 lb 8.1 oz (110 kg)    PHYSICAL EXAM  General: Pleasant, NAD. Neuro: Alert and oriented X 3. Moves all extremities spontaneously. Psych: Normal affect. HEENT:  Normal  Neck: Supple without bruits or JVD. Lungs:  Resp regular and unlabored, CTA. Heart: RRR no s3, s4, or murmurs. Abdomen: Soft, non-tender, non-distended, BS + x 4.  Extremities: No clubbing, cyanosis or edema. DP/PT/Radials 2+ and equal bilaterally.  Accessory Clinical Findings  CBC  Recent Labs  06/21/16 0315 06/23/16 0318  WBC 15.2* 9.7  HGB 14.6 13.5  HCT 43.7 41.5  MCV 86.4 86.1  PLT 230 123456   Basic Metabolic Panel  Recent Labs  06/22/16 0244 06/23/16 0318  NA 137 139  K 4.2 3.0*  CL 104 101  CO2 29 30  GLUCOSE 147* 121*  BUN 20 15   CREATININE 1.17 1.04  CALCIUM 8.8* 8.7*   Liver Function Tests  Recent Labs  06/20/16 1938  AST 27  ALT 28  ALKPHOS 56  BILITOT 1.0  PROT 6.8  ALBUMIN 3.8   No results for input(s): LIPASE, AMYLASE in the last 72 hours. Cardiac Enzymes  Recent Labs  06/20/16 1938  06/21/16 0315 06/21/16 1009 06/22/16 0244  CKTOTAL 98  --   --   --   --   CKMB 2.2  --   --   --   --   TROPONINI  --   < > 17.51* 21.64* 10.14*  < > = values in this interval not displayed. BNP Invalid input(s): POCBNP D-Dimer No results for input(s): DDIMER in the last 72 hours. Hemoglobin A1C  Recent Labs  06/20/16 2144  HGBA1C 6.3*   Fasting Lipid Panel  Recent Labs  06/20/16 1938  CHOL 120  HDL 32*  LDLCALC 70  TRIG 88  CHOLHDL 3.8   Thyroid Function Tests  Recent Labs  06/20/16 2144  TSH 1.443    TELE NSR    ECG  No new eKG  Echocardiogram 06/21/2016  LV EF: 55% -   60%  ------------------------------------------------------------------- Indications:      Chest pain 786.51.  ------------------------------------------------------------------- History:   PMH:  STEMI involving LAD. Hyperlipidemia. Essential hypertension. CAD. Cardiac arrest.  ------------------------------------------------------------------- Study Conclusions  - Left ventricle: The cavity size was normal. Wall thickness was   normal. Systolic function was normal. The estimated ejection   fraction was in the range of 55% to 60%. Features are consistent   with a pseudonormal left ventricular filling pattern, with   concomitant abnormal relaxation and increased filling pressure   (grade 2 diastolic dysfunction). - Left atrium: The atrium was mildly dilated. - Pulmonary arteries: PA peak pressure: 42 mm Hg (S).     Radiology/Studies  Dg Chest Port 1 View  Result Date: 06/22/2016 CLINICAL DATA:  Pt arrived as a STEMI on Friday. In MD notes: Check portable CXR due to chills/sweats and early  opacity on CXR 2 days ago - there is dullness in the right base, r/o early pneumonia - although leukocytosis has been resolving. Hx HTN EXAM: PORTABLE CHEST 1 VIEW COMPARISON:  06/20/2016 FINDINGS: Midline trachea. Cardiomegaly accentuated by AP portable technique. No pleural effusion or pneumothorax. No congestive failure. Resolved interstitial and right upper lobe airspace disease. Developing left infrahilar and lower lobe relatively linear opacity. IMPRESSION: Improved aeration with resolved interstitial edema and presumed right upper lobe alveolar edema. Developing left base and infrahilar opacity, favoring atelectasis. Recommend attention on follow-up. Electronically Signed   By: Abigail Miyamoto M.D.   On: 06/22/2016 12:20  Dg Chest Port 1 View  Result Date: 06/20/2016 CLINICAL DATA:  ST elevation myocardial infarction. EXAM: PORTABLE CHEST 1 VIEW COMPARISON:  03/09/2016 FINDINGS: Stable normal heart size. Stable aortic and hilar contours. Diffuse interstitial coarsening with asymmetric right-sided airspace opacity. IMPRESSION: Interstitial opacities and right asymmetric airspace disease, asymmetric edema given the history. No visible fracture or pneumothorax from prior CPR. Electronically Signed   By: Monte Fantasia M.D.   On: 06/20/2016 23:42    ASSESSMENT AND PLAN  1. Anterolateral STEMI complicated by VFib arrest  - cath 06/20/2016 99% RPDA, 40% prox RCA, 99% prox LAD treated with DES, 99% ost D1 treated with aspiration thrombectomy reducing to 80% residual, 90% prox LCx treated with aspiration thrombectomy reducing to 10%,  EF 45%  - Echo 06/21/2016 EF 55-60%, grade 2 DD, PA peak pressure 38mmHg  - continue ASA, lipitor, brilinta. Consider restart losartan at 25mg  daily on followup if BP stable. Per pt, not on BB due to h/o bradycardia. Holding home losartan and amlodipine. Likely discharge today.  2. VFib arrest with cardiogenic shock requiring levophed: improved  3. Post MI afib: treated with  IV amiodarone, stopped on 7/23  - CHA2DS2-Vasc score 2 (HTN, CAD). Currently in sinus, last 2 days EKG was in afib.  - will discuss with MD, unless recurrence, likely will hold off on starting systemic anticoagulation given the need for DAPT. May consider 30 day event monitor on discharge.   4. Hypokalemia: replete with 48meq x 2 dose, off lasix, K 3.0  5. CAD s/p PCI to LAD and OM  6. HTN  7. HLD  8. Prediabetes: hgb A1C 6.3   Signed, Almyra Deforest PA-C Pager: [phone]  I have seen and examined the patient along with Almyra Deforest, PA.  I have reviewed the chart, notes and new data.  I agree with  PA's note.  Key new complaints: feels well. Has already walked 3 laps Key examination changes: no physical evidence of hypervolemia. In / out not consistently collected. HR 40s overnight. Key new findings / data: K 3.0  PLAN:  Replete K. Recheck early this PM for possible DC later today. Early follow up. Mandatory DAPT 12 months, preferably lifelong. Resume losartan at DC, hold amlodipine until follow up.   Sanda Klein, MD, [place] [phone] 06/23/2016, 9:58 AM

## 2016-06-23 NOTE — Progress Notes (Signed)
Patient discharged home with wife. IV's were dc'd and were intact. A brilinta card/coupon were given to the patient.

## 2016-06-24 NOTE — Care Management Note (Signed)
Case Management Note Marvetta Gibbons RN, BSN Unit 2W-Case Manager [phone]  Patient Details  Name: Matthew Doyle MRN: [id number] Date of Birth: [birth year]  Subjective/Objective:       Pt admitted with STEMI             Action/Plan: PTA pt lived at home- plan to return home- on d/c -bedside RN came to say pt going out on Brilinta- 30 day free card along with copay assist card- given to pt- pt ready for discharge- unable to complete benefit check prior to discharge.   Expected Discharge Date:    06/23/16              Expected Discharge Plan:  Home/Self Care  In-House Referral:     Discharge planning Services  CM Consult, Medication Assistance  Post Acute Care Choice:    Choice offered to:     DME Arranged:    DME Agency:     HH Arranged:    HH Agency:     Status of Service:  Completed, signed off  If discussed at [employer] of Stay Meetings, dates discussed:    Additional Comments:  Dawayne Patricia, RN 06/24/2016, 3:23 PM

## 2016-06-26 ENCOUNTER — Encounter: Payer: Self-pay | Admitting: Family Medicine

## 2016-06-26 ENCOUNTER — Ambulatory Visit (INDEPENDENT_AMBULATORY_CARE_PROVIDER_SITE_OTHER): Payer: BC Managed Care – PPO | Admitting: Family Medicine

## 2016-06-26 VITALS — BP 118/76 | HR 68 | Temp 98.5°F | Wt 239.8 lb

## 2016-06-26 DIAGNOSIS — I2102 ST elevation (STEMI) myocardial infarction involving left anterior descending coronary artery: Secondary | ICD-10-CM | POA: Diagnosis not present

## 2016-06-26 DIAGNOSIS — I10 Essential (primary) hypertension: Secondary | ICD-10-CM | POA: Diagnosis not present

## 2016-06-26 DIAGNOSIS — E785 Hyperlipidemia, unspecified: Secondary | ICD-10-CM | POA: Diagnosis not present

## 2016-06-26 DIAGNOSIS — I480 Paroxysmal atrial fibrillation: Secondary | ICD-10-CM | POA: Diagnosis not present

## 2016-06-26 MED ORDER — ONDANSETRON HCL 4 MG PO TABS: 4.0000 mg | ORAL_TABLET | Freq: Three times a day (TID) | ORAL | 0 refills | Status: DC | PRN

## 2016-06-26 NOTE — Patient Instructions (Signed)
I'm glad you're doing better. Let us know if worsening shortness of breath. Continue medicines as up to now, follow up with Dr Fletcher Anon on Monday.

## 2016-06-26 NOTE — Progress Notes (Signed)
Pre visit review using our clinic review tool, if applicable. No additional management support is needed unless otherwise documented below in the visit note. 

## 2016-06-26 NOTE — Assessment & Plan Note (Signed)
Zetia added due to atherosclerosis progression despite good lipid control on high dose lipitor.

## 2016-06-26 NOTE — Assessment & Plan Note (Signed)
Sounds regular today.  ?

## 2016-06-26 NOTE — Progress Notes (Addendum)
BP 118/76   Pulse 68   Temp 98.5 F (36.9 C) (Oral)   Wt 239 lb 12 oz (108.7 kg)   SpO2 95%   BMI 32.52 kg/m  (O2 sat on RA)  CC: hosp f/u visit Subjective:    Patient ID: Matthew Doyle, male    DOB: Jul 23, 1956, 60 y.o.   MRN: [id number]  HPI: Matthew Doyle is a 60 y.o. male presenting on 06/26/2016 for Follow-up (hospital)   Here with wife today.  Patient of Dr Alla German here for hospital f/u visit. Records reviewed. Recent hospitalization with LAD STEMI brought in as code STEMI directly into cath lab. Known CAD with prior MIs with prior stenting of mid LAD (Cypher DES 2004) and OM1 (promus DES 2014). Vfib on trip in ambulance needed resuscitation (2 min CPR and 1 defibrillation). Received prox LAD DES, aspiration thrombectomy of OD1 and aspiration thrombectomy of prox LCx. He did have post MI atrial fibrillation for 1-2 days which resolved prior to discharge. Treated with IV lasix.   Zetia and brilinta was started, lipitor 80mg  daily was continued, aspirin was continued. Losartan 100mg  daily was continued. Amlodipine was stopped.   Has f/u scheduled with Dr Fletcher Anon 06/30/2016 for TCM visit.  Has been scheduled with cardiac rehab - awaiting notification about insurance coverage for this.   Monday night had low grade fever and chills. None since. Staying nauseated - treating with zofran. Ongoing dyspnea since STEMI, overall unchanged since he's been home. Low appetite. No cough, chest pain, abdominal pain, urinary symptoms, sore throat or congestion.   Wife smokes outside.   Admit date: 06/20/2016 Discharge date: 06/23/2016 Principal Problem:   ST elevation (STEMI) myocardial infarction involving left anterior descending coronary artery [facility]) Active Problems:   Hyperlipidemia   Essential hypertension   CAD (coronary artery disease)   Atrial fibrillation ([place]) post MI  Relevant past medical, surgical, family and social history reviewed and updated as indicated. Interim medical  history since our last visit reviewed. Allergies and medications reviewed and updated. Current Outpatient Prescriptions on File Prior to Visit  Medication Sig  . aspirin 81 MG tablet Take 1 tablet (81 mg total) by mouth daily.  Marland Kitchen atorvastatin (LIPITOR) 80 MG tablet Take 1 tablet (80 mg total) by mouth daily at 6 PM.  . ezetimibe (ZETIA) 10 MG tablet Take 1 tablet (10 mg total) by mouth daily.  Marland Kitchen losartan (COZAAR) 100 MG tablet TAKE ONE TABLET BY MOUTH ONCE DAILY  . nitroGLYCERIN (NITROSTAT) 0.4 MG SL tablet Place 1 tablet (0.4 mg total) under the tongue every 5 (five) minutes as needed for chest pain.  . ticagrelor (BRILINTA) 90 MG TABS tablet Take 1 tablet (90 mg total) by mouth 2 (two) times daily.   No current facility-administered medications on file prior to visit.     Review of Systems Per HPI unless specifically indicated in ROS section     Objective:    BP 118/76   Pulse 68   Temp 98.5 F (36.9 C) (Oral)   Wt 239 lb 12 oz (108.7 kg)   SpO2 95%   BMI 32.52 kg/m   Wt Readings from Last 3 Encounters:  06/26/16 239 lb 12 oz (108.7 kg)  06/20/16 242 lb 8.1 oz (110 kg)  05/19/16 242 lb 12 oz (110.1 kg)    Physical Exam  Constitutional: He appears well-developed and well-nourished. No distress.  Cardiovascular: Normal rate, regular rhythm, normal heart sounds and intact distal pulses.   No murmur heard.  Pulmonary/Chest: Effort normal and breath sounds normal. No respiratory distress. He has no wheezes. He has no rales.  Lungs are clear  Musculoskeletal: He exhibits no edema.  Psychiatric: He has a normal mood and affect.  Nursing note and vitals reviewed.   Lab Results  Component Value Date   CHOL 120 06/20/2016   HDL 32 (L) 06/20/2016   LDLCALC 70 06/20/2016   TRIG 88 06/20/2016   CHOLHDL 3.8 06/20/2016    Lab Results  Component Value Date   CREATININE 1.08 06/23/2016    Lab Results  Component Value Date   TSH 1.443 06/20/2016    Lab Results  Component  Value Date   CKTOTAL 98 06/20/2016   CKMB 2.2 06/20/2016   TROPONINI 10.14 (HH) 06/22/2016    Lab Results  Component Value Date   HGBA1C 6.3 (H) 06/20/2016       Assessment & Plan:   Problem List Items Addressed This Visit    Atrial fibrillation ([place]) post MI    Sounds regular today.       Essential hypertension    Chronic, stable off amlodipine. Continue losartan 100mg  daily.       Hyperlipidemia    Zetia added due to atherosclerosis progression despite good lipid control on high dose lipitor.      ST elevation (STEMI) myocardial infarction involving left anterior descending coronary artery ([place]) - Primary    Records reviewed. Compliant with discharged medication regimen. Noticing ongoing dyspnea since MI but vitals are stable today, good O2 sat at rest. Anticipate deconditioning after recent MI with short cardiac arrest en route. Lungs clear today, no signs of infection or PE. rec f/u with Dr Fletcher Anon for TCM visit on Monday as scheduled.        Other Visit Diagnoses   None.      Follow up plan: Return if symptoms worsen or fail to improve.  Ria Bush, MD

## 2016-06-26 NOTE — Assessment & Plan Note (Signed)
Records reviewed. Compliant with discharged medication regimen. Noticing ongoing dyspnea since MI but vitals are stable today, good O2 sat at rest. Anticipate deconditioning after recent MI with short cardiac arrest en route. Lungs clear today, no signs of infection or PE. rec f/u with Dr Fletcher Anon for TCM visit on Monday as scheduled.

## 2016-06-26 NOTE — Assessment & Plan Note (Signed)
Chronic, stable off amlodipine. Continue losartan 100mg  daily.

## 2016-06-30 ENCOUNTER — Encounter: Payer: Self-pay | Admitting: Cardiovascular Disease

## 2016-06-30 ENCOUNTER — Ambulatory Visit (INDEPENDENT_AMBULATORY_CARE_PROVIDER_SITE_OTHER): Payer: BC Managed Care – PPO | Admitting: Cardiovascular Disease

## 2016-06-30 VITALS — BP 116/92 | HR 62 | Ht 72.0 in | Wt 239.8 lb

## 2016-06-30 DIAGNOSIS — I2510 Atherosclerotic heart disease of native coronary artery without angina pectoris: Secondary | ICD-10-CM

## 2016-06-30 DIAGNOSIS — E785 Hyperlipidemia, unspecified: Secondary | ICD-10-CM

## 2016-06-30 DIAGNOSIS — I10 Essential (primary) hypertension: Secondary | ICD-10-CM | POA: Diagnosis not present

## 2016-06-30 NOTE — Patient Instructions (Signed)
Medication Instructions:  Your physician recommends that you continue on your current medications as directed. Please refer to the Current Medication list given to you today.   Labwork: none  Testing/Procedures: None.  Follow-Up: Your physician recommends that you schedule a follow-up appointment in: 3 months with Dr. Fletcher Anon.    Any Other Special Instructions Will Be Listed Below (If Applicable).     If you need a refill on your cardiac medications before your next appointment, please call your pharmacy.

## 2016-06-30 NOTE — Progress Notes (Signed)
Cardiology Office Note   Date:  06/30/2016   ID:  Matthew Doyle, Matthew Doyle Sep 15, 1956, MRN [id number]  PCP:  Viviana Simpler, MD  Cardiologist:   [name], MD   Chief Complaint  Patient presents with  . Follow-up    NO CP, SOB OR SWELLING. NO OTHER COMPLAINTS.      History of Present Illness: Matthew Doyle is a 60 y.o. male who presents for a followup visit regarding coronary artery disease.  He is s/p angioplasty and drug-eluting stent placement to the LAD in 2004 and PCI And drug-eluting stent placement to OM 2 in January 2014 after a small non-ST elevation myocardial infarction. Cardiac catheterization at that time showed patent LAD stent, occluded right PDA distally supplying a small territory and 99% proximal OM 2 stenosis .  Ejection fraction was normal. He presented on July 21 with anterolateral ST elevation myocardial infarction complicated by ventricular fibrillation. He was shocked once with restoration of sinus rhythm and underwent emergent cardiac catheterization which showed subtotal thrombotic occlusion of proximal LAD at the diagonal bifurcation and also thrombus in the mid left circumflex. He underwent aspiration thrombectomy of the diagonal and left circumflex and drug-eluting stent placement to the proximal LAD. LV gram showed an ejection fraction of 45% but subsequent echo showed normal LV systolic function. Before the acute onset of chest pain, the patient was welding in the hot weather. He was not on dual antiplatelet therapy. Overall, he has been doing reasonably well with no recurrent chest pain or shortness of breath. He continues to complain of fatigue, anxiety and feeling shaky. 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 He has not started cardiac rehabilitation.   Past Medical History:  Diagnosis Date  . Arthritis    knee  . Atrial fibrillation ([place]) post MI   . CAD (coronary artery disease)    a. 09/2003 Cath/PCI: LAD 80%->3.0x23  Cypher DES;  b. 03/2004 Cath ;  c. NSTEMI 12/2012 cath patent LAD stent, included distal right PDA supplying a small territory, 99% proximal OM 2 status post PCI and DES placement  d. cath 06/20/2016 anterolateral STEMI stent to LAD, PCTA to diag and LCx  . Colon polyps   . Diverticulosis   . Dysrhythmia   . Gout   . Hemorrhoids   . Hyperlipidemia   . Hypertension   . Internal hemorrhoids without mention of complication   . Myocardial infarction ([place])    LAST 12/16/12  . ST elevation (STEMI) myocardial infarction involving left anterior descending coronary artery ([place]) 06/20/2016    Past Surgical History:  Procedure Laterality Date  . CARDIAC CATHETERIZATION  2014   s/p stent  . CARDIAC CATHETERIZATION N/A 06/20/2016   Procedure: Left Heart Cath and Coronary Angiography;  Surgeon: Sherren Mocha, MD;  Location: [place] CV LAB;  Service: Cardiovascular;  Laterality: N/A;  . CARDIAC CATHETERIZATION N/A 06/20/2016   Procedure: Coronary Stent Intervention;  Surgeon: Sherren Mocha, MD;  Location: [place] CV [facility];  Service: Cardiovascular;  Laterality: N/A;  . CHOLECYSTECTOMY N/A 03/13/2016   Procedure: LAPAROSCOPIC CHOLECYSTECTOMY WITH INTRAOPERATIVE CHOLANGIOGRAM;  Surgeon: Christene Lye, MD;  Location: [facility] ORS;  Service: General;  Laterality: N/A;  . CHOLECYSTECTOMY, LAPAROSCOPIC  03/13/16   Infection - lengthy diagnosis d/t lack of gallstones  . COLONOSCOPY  10/2013  . CORONARY ANGIOPLASTY     STENTS  . Cypher Stent  10/04   mid LAD- gupta  . LEFT HEART CATHETERIZATION WITH  CORONARY ANGIOGRAM N/A 12/17/2012   Procedure: LEFT HEART CATHETERIZATION WITH CORONARY ANGIOGRAM;  Surgeon: Peter M Martinique, MD;  Location: [facility] CATH LAB;  Service: Cardiovascular;  Laterality: N/A;  . PERCUTANEOUS CORONARY STENT INTERVENTION (PCI-S) Right 12/17/2012   Procedure: PERCUTANEOUS CORONARY STENT INTERVENTION (PCI-S);  Surgeon: Peter M Martinique, MD;  Location: [facility] CATH LAB;  Service: Cardiovascular;   Laterality: Right;     Current Outpatient Prescriptions  Medication Sig Dispense Refill  . aspirin 81 MG tablet Take 1 tablet (81 mg total) by mouth daily. 30 tablet   . atorvastatin (LIPITOR) 80 MG tablet Take 1 tablet (80 mg total) by mouth daily at 6 PM. 90 tablet 3  . ezetimibe (ZETIA) 10 MG tablet Take 1 tablet (10 mg total) by mouth daily. 90 tablet 3  . losartan (COZAAR) 100 MG tablet TAKE ONE TABLET BY MOUTH ONCE DAILY 90 tablet 3  . nitroGLYCERIN (NITROSTAT) 0.4 MG SL tablet Place 1 tablet (0.4 mg total) under the tongue every 5 (five) minutes as needed for chest pain. 25 tablet 3  . ondansetron (ZOFRAN) 4 MG tablet Take 1 tablet (4 mg total) by mouth every 8 (eight) hours as needed for nausea or vomiting. 20 tablet 0  . ticagrelor (BRILINTA) 90 MG TABS tablet Take 1 tablet (90 mg total) by mouth 2 (two) times daily. 180 tablet 3   No current facility-administered medications for this visit.     Allergies:   Lisinopril    Social History:  The patient  reports that he has never smoked. He has never used smokeless tobacco. He reports that he does not drink alcohol or use drugs.   Family History:  The patient's family history includes Alzheimer's disease in his maternal grandmother; Arrhythmia in his brother; Coronary artery disease in his father and maternal grandfather; Diabetes in his father; Heart attack in his maternal grandfather, maternal uncle, and paternal uncle; Hypertension in his brother, brother, and mother; Throat cancer in his maternal grandfather.    ROS:  Please see the history of present illness.   Otherwise, review of systems are positive for none.   All other systems are reviewed and negative.    PHYSICAL EXAM: VS:  BP (!) 116/92 (BP Location: Left Arm, Patient Position: Sitting, Cuff Size: Normal)   Pulse 62   Ht 6' (1.829 m)   Wt 239 lb 12.8 oz (108.8 kg)   BMI 32.52 kg/m  , BMI Body mass index is 32.52 kg/m. GEN: Well nourished, well developed, in no  acute distress HEENT: normal Neck: no JVD, carotid bruits, or masses Cardiac: RRR; no murmurs, rubs, or gallops,no edema  Respiratory:  clear to auscultation bilaterally, normal work of breathing GI: soft, nontender, nondistended, + BS MS: no deformity or atrophy Skin: warm and dry, no rash Neuro:  Strength and sensation are intact Psych: euthymic mood, full affect Right radial pulse is normal without hematoma.  EKG:  EKG is ordered today. EKG showed normal sinus rhythm with nonspecific T wave changes.   Recent Labs: 06/20/2016: ALT 28; B Natriuretic Peptide 40.8; TSH 1.443 06/23/2016: BUN 19; Creatinine, Ser 1.08; Hemoglobin 13.5; Platelets 203; Potassium 3.9; Sodium 137    Lipid Panel    Component Value Date/Time   CHOL 120 06/20/2016 1938   CHOL 91 (L) 03/31/2013 0914   TRIG 88 06/20/2016 1938   HDL 32 (L) 06/20/2016 1938   HDL 36 (L) 03/31/2013 0914   CHOLHDL 3.8 06/20/2016 1938   VLDL 18 06/20/2016 1938   [place]  70 06/20/2016 1938   LDLCALC 47 03/31/2013 0914      Wt Readings from Last 3 Encounters:  06/30/16 239 lb 12.8 oz (108.8 kg)  06/26/16 239 lb 12 oz (108.7 kg)  06/20/16 242 lb 8.1 oz (110 kg)       ASSESSMENT AND PLAN:  1.   coronary artery disease involving native coronary arteries with recent anterolateral ST elevation myocardial infarction: I reviewed the 2 most recent cardiac catheterizations with him today. I am somewhat puzzled by the acute onset of thrombus formation in proximal LAD and mid left circumflex. His previous catheterization in 2014 did not show significant plaque formation in these areas. 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 I do think that he needs to be on lifelong dual antiplatelet therapy. At the present time, he is doing reasonably well but I want him to stay off work for 2 months given how physical his job is. He can resume work on September 21. This should give him reasonable time  to attend cardiac rehabilitation which he is going to start soon.  2. Essential hypertension: Blood pressure is reasonably controlled.  Amlodipine was discontinued while hospitalized.  3. Hyperlipidemia: Continue high dose atorvastatin and Zetia. Most recent LDL was 70.  4.  Post MI anxiety: I'm hoping that this will improve with cardiac rehabilitation but if symptoms do not improve, we can consider treatment with an SSRI.  Disposition:   FU with me in 3 months  Signed,  [name], MD  06/30/2016 5:03 PM    [place]

## 2016-07-03 ENCOUNTER — Telehealth: Payer: Self-pay | Admitting: Cardiovascular Disease

## 2016-07-03 ENCOUNTER — Encounter: Payer: BC Managed Care – PPO | Attending: Cardiovascular Disease | Admitting: *Deleted

## 2016-07-03 VITALS — BP 130/86 | HR 67 | Ht 71.5 in | Wt 242.1 lb

## 2016-07-03 DIAGNOSIS — I252 Old myocardial infarction: Secondary | ICD-10-CM | POA: Diagnosis not present

## 2016-07-03 DIAGNOSIS — Z7982 Long term (current) use of aspirin: Secondary | ICD-10-CM | POA: Diagnosis not present

## 2016-07-03 DIAGNOSIS — I2510 Atherosclerotic heart disease of native coronary artery without angina pectoris: Secondary | ICD-10-CM | POA: Insufficient documentation

## 2016-07-03 DIAGNOSIS — Z955 Presence of coronary angioplasty implant and graft: Secondary | ICD-10-CM | POA: Diagnosis not present

## 2016-07-03 NOTE — Progress Notes (Signed)
Cardiac Individual Treatment Plan  Patient Details  Name: ALAIN DESCHENE MRN: [id number] Date of Birth: February 16, 1956 Referring Provider:   Flowsheet Row Cardiac Rehab from 07/03/2016 in [facility] Cardiac and Pulmonary Rehab  Referring Provider  Arida      Initial Encounter Date:  Flowsheet Row Cardiac Rehab from 07/03/2016 in [facility] Cardiac and Pulmonary Rehab  Date  07/03/16  Referring Provider  Fletcher Anon      Visit Diagnosis: S/P coronary artery stent placement  Patient's Home Medications on Admission:  Current Outpatient Prescriptions:  .  aspirin 81 MG tablet, Take 1 tablet (81 mg total) by mouth daily., Disp: 30 tablet, Rfl:  .  atorvastatin (LIPITOR) 80 MG tablet, Take 1 tablet (80 mg total) by mouth daily at 6 PM., Disp: 90 tablet, Rfl: 3 .  ezetimibe (ZETIA) 10 MG tablet, Take 1 tablet (10 mg total) by mouth daily., Disp: 90 tablet, Rfl: 3 .  losartan (COZAAR) 100 MG tablet, TAKE ONE TABLET BY MOUTH ONCE DAILY, Disp: 90 tablet, Rfl: 3 .  nitroGLYCERIN (NITROSTAT) 0.4 MG SL tablet, Place 1 tablet (0.4 mg total) under the tongue every 5 (five) minutes as needed for chest pain., Disp: 25 tablet, Rfl: 3 .  ticagrelor (BRILINTA) 90 MG TABS tablet, Take 1 tablet (90 mg total) by mouth 2 (two) times daily., Disp: 180 tablet, Rfl: 3 .  ondansetron (ZOFRAN) 4 MG tablet, Take 1 tablet (4 mg total) by mouth every 8 (eight) hours as needed for nausea or vomiting. (Patient not taking: Reported on 07/03/2016), Disp: 20 tablet, Rfl: 0  Past Medical History: Past Medical History:  Diagnosis Date  . Arthritis    knee  . Atrial fibrillation ([place]) post MI   . CAD (coronary artery disease)    a. 09/2003 Cath/PCI: LAD 80%->3.0x23 Cypher DES;  b. 03/2004 Cath ;  c. NSTEMI 12/2012 cath patent LAD stent, included distal right PDA supplying a small territory, 99% proximal OM 2 status post PCI and DES placement  d. cath 06/20/2016 anterolateral STEMI stent to LAD, PCTA to diag and LCx  . Colon polyps   .  Diverticulosis   . Dysrhythmia   . Gout   . Hemorrhoids   . Hyperlipidemia   . Hypertension   . Internal hemorrhoids without mention of complication   . Myocardial infarction ([place])    LAST 12/16/12  . ST elevation (STEMI) myocardial infarction involving left anterior descending coronary artery ([place]) 06/20/2016    Tobacco Use: History  Smoking Status  . Never Smoker  Smokeless Tobacco  . Never Used    Labs: Recent Review Flowsheet Data    Labs for ITP Cardiac and Pulmonary Rehab Latest Ref Rng & Units 05/26/2014 05/28/2015 03/09/2016 06/20/2016 06/20/2016   Cholestrol 0 - 200 mg/dL 119 118 - 120 -   LDLCALC 0 - 99 mg/dL 72 74 - 70 -   HDL >40 mg/dL 35.50(L) 29.90(L) - 32(L) -   Trlycerides <150 mg/dL 57.0 69.0 - 88 -   Hemoglobin A1c 4.8 - 5.6 % 6.4 - 5.7 6.3(H) 6.3(H)   TCO2 0 - 100 mmol/L - - - 18 -       Exercise Target Goals: Date: 07/03/16  Exercise Program Goal: Individual exercise prescription set with THRR, safety & activity barriers. Participant demonstrates ability to understand and report RPE using BORG scale, to self-measure pulse accurately, and to acknowledge the importance of the exercise prescription.  Exercise Prescription Goal: Starting with aerobic activity 30 plus minutes a day, 3 days per  week for initial exercise prescription. Provide home exercise prescription and guidelines that participant acknowledges understanding prior to discharge.  Activity Barriers & Risk Stratification:     Activity Barriers & Cardiac Risk Stratification - 07/03/16 1346      Activity Barriers & Cardiac Risk Stratification   Activity Barriers Arthritis;Deconditioning;Joint Problems   Comments left shoulder fell off a bus years ago and landed on his shoulder so Don can't lift it up over his head. Timmothy Sours reports  "both of my knees are bad".    Cardiac Risk Stratification High      6 Minute Walk:     6 Minute Walk    Row Name 07/03/16 1317         6 Minute Walk   Distance  1390 feet     Walk Time 6 minutes     MPH 2.63     METS 3.2     RPE 12     VO2 Peak 11.25     Resting HR 67 bpm     Resting BP 130/86     Max Ex. HR 84 bpm     Max Ex. BP 144/92        Initial Exercise Prescription:     Initial Exercise Prescription - 07/03/16 1300      Date of Initial Exercise RX and Referring Provider   Date 07/03/16   Referring Provider Arida     Treadmill   MPH 2.6   Grade 0.5   Minutes 15   METs 3.17     Bike   Level 1.3   Minutes 15     Recumbant Bike   Level 2   RPM 60   Minutes 15   METs 3.14     NuStep   Level 2   Watts 50   METs 2.6     REL-XR   Level 2   Minutes 15   METs 3     T5 Nustep   Level 2   Minutes 15   METs 2.9     Prescription Details   Frequency (times per week) 3   Duration Progress to 45 minutes of aerobic exercise without signs/symptoms of physical distress     Intensity   THRR 40-80% of Max Heartrate 104-141   Ratings of Perceived Exertion 11-15     Progression   Progression Continue to progress workloads to maintain intensity without signs/symptoms of physical distress.     Resistance Training   Training Prescription Yes   Weight 3      Perform Capillary Blood Glucose checks as needed.  Exercise Prescription Changes:   Exercise Comments:   Discharge Exercise Prescription (Final Exercise Prescription Changes):   Nutrition:  Target Goals: Understanding of nutrition guidelines, daily intake of sodium <1563m, cholesterol <2086m calories 30% from fat and 7% or less from saturated fats, daily to have 5 or more servings of fruits and vegetables.  Biometrics:     Pre Biometrics - 07/03/16 1316      Pre Biometrics   Height 5' 11.5" (1.816 m)   Weight 242 lb 1.6 oz (109.8 kg)   Waist Circumference 45.5 inches   Hip Circumference 47.5 inches   Waist to Hip Ratio 0.96 %   BMI (Calculated) 33.4   Single Leg Stand 30 seconds       Nutrition Therapy Plan and Nutrition Goals:      Nutrition Therapy & Goals - 07/03/16 1351      Nutrition Therapy   Drug/Food  Interactions Statins/Certain Fruits     Personal Nutrition Goals   Comments Timmothy Sours said his wife fixes all his meals. He usually takes left overs for lunch to work.       Nutrition Discharge: Rate Your Plate Scores:   Nutrition Goals Re-Evaluation:   Psychosocial: Target Goals: Acknowledge presence or absence of depression, maximize coping skills, provide positive support system. Participant is able to verbalize types and ability to use techniques and skills needed for reducing stress and depression.  Initial Review & Psychosocial Screening:     Initial Psych Review & Screening - 07/03/16 [place]? Yes   Comments Don's left shoulder fell off a bus years ago and landed on his shoulder so Don can't lift it up over his head. Timmothy Sours reports  "both of my knees are bad". Timmothy Sours reports he has 6 months of PTO time so is getting paid for being off work. He also said he does not have a copay for Cardiac Rehab since he has met his $5000 deducttible. 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      Screening Interventions   Interventions Encouraged to exercise      Quality of Life Scores:   PHQ-9: Recent Review Flowsheet Data    Depression screen PHQ 2/9 07/03/2016   Decreased Interest 0   Down, Depressed, Hopeless 0   PHQ - 2 Score 0   Altered sleeping 3   Tired, decreased energy 3   Change in appetite 3   Feeling bad or failure about yourself  1   Trouble concentrating 1   Moving slowly or fidgety/restless 0   Suicidal thoughts 0   PHQ-9 Score 11   Difficult doing work/chores Somewhat difficult      Psychosocial Evaluation and Intervention:   Psychosocial  Re-Evaluation:   Vocational Rehabilitation: Provide vocational rehab assistance to qualifying candidates.   Vocational Rehab Evaluation & Intervention:   Education: Education Goals: Education classes will be provided on a weekly basis, covering required topics. Participant will state understanding/return demonstration of topics presented.  Learning Barriers/Preferences:   Education Topics: General Nutrition Guidelines/Fats and Fiber: -Group instruction provided by verbal, written material, models and posters to present the general guidelines for heart healthy nutrition. Gives an explanation and review of dietary fats and fiber.   Controlling Sodium/Reading Food Labels: -Group verbal and written material supporting the discussion of sodium use in heart healthy nutrition. Review and explanation with models, verbal and written materials for utilization of the food label.   Exercise Physiology & Risk Factors: - Group verbal and written instruction with models to review the exercise physiology of the cardiovascular system and associated critical values. Details cardiovascular disease risk factors and the goals associated with each risk factor.   Aerobic Exercise & Resistance Training: - Gives group verbal and written discussion on the health impact of inactivity. On the components of aerobic and resistive training programs and the benefits of this training and how to safely progress through these programs.   Flexibility, Balance, General Exercise Guidelines: - Provides group verbal and written instruction on the benefits of flexibility and balance training programs. Provides general exercise guidelines with specific guidelines to those with heart or lung disease. Demonstration  and skill practice provided.   Stress Management: - Provides group verbal and written instruction about the health risks of elevated stress, cause of high stress, and healthy ways to reduce  stress.   Depression: - Provides group verbal and written instruction on the correlation between heart/lung disease and depressed mood, treatment options, and the stigmas associated with seeking treatment.   Anatomy & Physiology of the Heart: - Group verbal and written instruction and models provide basic cardiac anatomy and physiology, with the coronary electrical and arterial systems. Review of: AMI, Angina, Valve disease, Heart Failure, Cardiac Arrhythmia, Pacemakers, and the ICD.   Cardiac Procedures: - Group verbal and written instruction and models to describe the testing methods done to diagnose heart disease. Reviews the outcomes of the test results. Describes the treatment choices: Medical Management, Angioplasty, or Coronary Bypass Surgery.   Cardiac Medications: - Group verbal and written instruction to review commonly prescribed medications for heart disease. Reviews the medication, class of the drug, and side effects. Includes the steps to properly store meds and maintain the prescription regimen.   Go Sex-Intimacy & Heart Disease, Get SMART - Goal Setting: - Group verbal and written instruction through game format to discuss heart disease and the return to sexual intimacy. Provides group verbal and written material to discuss and apply goal setting through the application of the S.M.A.R.T. Method.   Other Matters of the Heart: - Provides group verbal, written materials and models to describe Heart Failure, Angina, Valve Disease, and Diabetes in the realm of heart disease. Includes description of the disease process and treatment options available to the cardiac patient.   Exercise & Equipment Safety: - Individual verbal instruction and demonstration of equipment use and safety with use of the equipment. Flowsheet Row Cardiac Rehab from 07/03/2016 in [facility] Cardiac and Pulmonary Rehab  Date  07/03/16  Educator  C. Enterkin,RN  Instruction Review Code  1- partially meets,  needs review/practice      Infection Prevention: - Provides verbal and written material to individual with discussion of infection control including proper hand washing and proper equipment cleaning during exercise session. Flowsheet Row Cardiac Rehab from 07/03/2016 in [facility] Cardiac and Pulmonary Rehab  Date  07/03/16  Educator  C. EnterkinRN  Instruction Review Code  2- meets goals/outcomes      Falls Prevention: - Provides verbal and written material to individual with discussion of falls prevention and safety. Flowsheet Row Cardiac Rehab from 07/03/2016 in [facility] Cardiac and Pulmonary Rehab  Date  07/03/16  Educator  C. Enterkin,RN  Instruction Review Code  2- meets goals/outcomes      Diabetes: - Individual verbal and written instruction to review signs/symptoms of diabetes, desired ranges of glucose level fasting, after meals and with exercise. Advice that pre and post exercise glucose checks will be done for 3 sessions at entry of program.    Knowledge Questionnaire Score:   Core Components/Risk Factors/Patient Goals at Admission:     Personal Goals and Risk Factors at Admission - 07/03/16 1352      Core Components/Risk Factors/Patient Goals on Admission    Weight Management Yes   Intervention Weight Management: Provide education and appropriate resources to help participant work on and attain dietary goals.   Admit Weight 242 lb (109.8 kg)   Goal Weight: Short Term 232 lb (105.2 kg)   Goal Weight: Long Term 205 lb (93 kg)   Expected Outcomes Short Term: Continue to assess and modify interventions until short term weight is achieved;Weight Maintenance:  Understanding of the daily nutrition guidelines, which includes 25-35% calories from fat, 7% or less cal from saturated fats, less than 279m cholesterol, less than 1.5gm of sodium, & 5 or more servings of fruits and vegetables daily;Weight Loss: Understanding of general recommendations for a balanced deficit meal plan, which  promotes 1-2 lb weight loss per week and includes a negative energy balance of [phone] kcal/d;Understanding recommendations for meals to include 15-35% energy as protein, 25-35% energy from fat, 35-60% energy from carbohydrates, less than 2050mof dietary cholesterol, 20-35 gm of total fiber daily;Understanding of distribution of calorie intake throughout the day with the consumption of 4-5 meals/snacks;Weight Gain: Understanding of general recommendations for a high calorie, high protein meal plan that promotes weight gain by distributing calorie intake throughout the day with the consumption for 4-5 meals, snacks, and/or supplements   Increase Strength and Stamina Yes   Intervention Provide advice, education, support and counseling about physical activity/exercise needs.;Develop an individualized exercise prescription for aerobic and resistive training based on initial evaluation findings, risk stratification, comorbidities and participant's personal goals.   Expected Outcomes Achievement of increased cardiorespiratory fitness and enhanced flexibility, muscular endurance and strength shown through measurements of functional capacity and personal statement of participant.   Lipids Yes   Intervention Provide education and support for participant on nutrition & aerobic/resistive exercise along with prescribed medications to achieve LDL <7082mHDL >37m63m Expected Outcomes Short Term: Participant states understanding of desired cholesterol values and is compliant with medications prescribed. Participant is following exercise prescription and nutrition guidelines.;Long Term: Cholesterol controlled with medications as prescribed, with individualized exercise RX and with personalized nutrition plan. Value goals: LDL < 70mg36mL > 40 mg.      Core Components/Risk Factors/Patient Goals Review:    Core Components/Risk Factors/Patient Goals at Discharge (Final Review):    ITP Comments:     ITP Comments     Row Name 07/03/16 1348 07/03/16 1351 07/03/16 1354       ITP Comments Don's left shoulder fell off a bus years ago and landed on his shoulder so Don can't lift it up over his head. Don rTimmothy Soursrts  "both of my knees are bad". Don rTimmothy Soursrts he has 6 months of PTO time so is getting paid for being off work. He also said he does not have a copay for Cardiac Rehab since he has met his $5000 deducttible. Don sTimmothy Sours he has taken more time off of work for his wife's medical problems Ie back surgery, radiation for sinus cancer?. 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 He usually takes left overs for lunch to work.  I emphasized the importance of Don to carry his NTG sl. I showed him a metal pill container that I carry on my key ring. Don sTimmothy Sours he had a prescription for NTG after his first heart attack but was not carrying it when he had this last heart attack.         Comments: Don iTimmothy Sourseady to start Cardiac Rehab on Monday after his orientation appt today. Don wTimmothy Sours have his wife call back to set up an individual appt with the Cardiac Rehab Registered DietiLoughman

## 2016-07-03 NOTE — Progress Notes (Signed)
Cardiac Individual Treatment Plan  Patient Details  Name: Matthew Doyle MRN: [id number] Date of Birth: February 16, 1956 Referring Provider:   Flowsheet Row Cardiac Rehab from 07/03/2016 in [facility] Cardiac and Pulmonary Rehab  Referring Provider  Arida      Initial Encounter Date:  Flowsheet Row Cardiac Rehab from 07/03/2016 in [facility] Cardiac and Pulmonary Rehab  Date  07/03/16  Referring Provider  Fletcher Anon      Visit Diagnosis: S/P coronary artery stent placement  Patient's Home Medications on Admission:  Current Outpatient Prescriptions:  .  aspirin 81 MG tablet, Take 1 tablet (81 mg total) by mouth daily., Disp: 30 tablet, Rfl:  .  atorvastatin (LIPITOR) 80 MG tablet, Take 1 tablet (80 mg total) by mouth daily at 6 PM., Disp: 90 tablet, Rfl: 3 .  ezetimibe (ZETIA) 10 MG tablet, Take 1 tablet (10 mg total) by mouth daily., Disp: 90 tablet, Rfl: 3 .  losartan (COZAAR) 100 MG tablet, TAKE ONE TABLET BY MOUTH ONCE DAILY, Disp: 90 tablet, Rfl: 3 .  nitroGLYCERIN (NITROSTAT) 0.4 MG SL tablet, Place 1 tablet (0.4 mg total) under the tongue every 5 (five) minutes as needed for chest pain., Disp: 25 tablet, Rfl: 3 .  ticagrelor (BRILINTA) 90 MG TABS tablet, Take 1 tablet (90 mg total) by mouth 2 (two) times daily., Disp: 180 tablet, Rfl: 3 .  ondansetron (ZOFRAN) 4 MG tablet, Take 1 tablet (4 mg total) by mouth every 8 (eight) hours as needed for nausea or vomiting. (Patient not taking: Reported on 07/03/2016), Disp: 20 tablet, Rfl: 0  Past Medical History: Past Medical History:  Diagnosis Date  . Arthritis    knee  . Atrial fibrillation ([place]) post MI   . CAD (coronary artery disease)    a. 09/2003 Cath/PCI: LAD 80%->3.0x23 Cypher DES;  b. 03/2004 Cath ;  c. NSTEMI 12/2012 cath patent LAD stent, included distal right PDA supplying a small territory, 99% proximal OM 2 status post PCI and DES placement  d. cath 06/20/2016 anterolateral STEMI stent to LAD, PCTA to diag and LCx  . Colon polyps   .  Diverticulosis   . Dysrhythmia   . Gout   . Hemorrhoids   . Hyperlipidemia   . Hypertension   . Internal hemorrhoids without mention of complication   . Myocardial infarction ([place])    LAST 12/16/12  . ST elevation (STEMI) myocardial infarction involving left anterior descending coronary artery ([place]) 06/20/2016    Tobacco Use: History  Smoking Status  . Never Smoker  Smokeless Tobacco  . Never Used    Labs: Recent Review Flowsheet Data    Labs for ITP Cardiac and Pulmonary Rehab Latest Ref Rng & Units 05/26/2014 05/28/2015 03/09/2016 06/20/2016 06/20/2016   Cholestrol 0 - 200 mg/dL 119 118 - 120 -   LDLCALC 0 - 99 mg/dL 72 74 - 70 -   HDL >40 mg/dL 35.50(L) 29.90(L) - 32(L) -   Trlycerides <150 mg/dL 57.0 69.0 - 88 -   Hemoglobin A1c 4.8 - 5.6 % 6.4 - 5.7 6.3(H) 6.3(H)   TCO2 0 - 100 mmol/L - - - 18 -       Exercise Target Goals: Date: 07/03/16  Exercise Program Goal: Individual exercise prescription set with THRR, safety & activity barriers. Participant demonstrates ability to understand and report RPE using BORG scale, to self-measure pulse accurately, and to acknowledge the importance of the exercise prescription.  Exercise Prescription Goal: Starting with aerobic activity 30 plus minutes a day, 3 days per  week for initial exercise prescription. Provide home exercise prescription and guidelines that participant acknowledges understanding prior to discharge.  Activity Barriers & Risk Stratification:     Activity Barriers & Cardiac Risk Stratification - 07/03/16 1346      Activity Barriers & Cardiac Risk Stratification   Activity Barriers Arthritis;Deconditioning;Joint Problems   Comments left shoulder fell off a bus years ago and landed on his shoulder so Matthew Doyle can't lift it up over his head. Matthew Doyle reports  "both of my knees are bad".    Cardiac Risk Stratification High      6 Minute Walk:     6 Minute Walk    Row Name 07/03/16 1317         6 Minute Walk   Distance  1390 feet     Walk Time 6 minutes     MPH 2.63     METS 3.2     RPE 12     VO2 Peak 11.25     Resting HR 67 bpm     Resting BP 130/86     Max Ex. HR 84 bpm     Max Ex. BP 144/92        Initial Exercise Prescription:     Initial Exercise Prescription - 07/03/16 1300      Date of Initial Exercise RX and Referring Provider   Date 07/03/16   Referring Provider Arida     Treadmill   MPH 2.6   Grade 0.5   Minutes 15   METs 3.17     Bike   Level 1.3   Minutes 15     Recumbant Bike   Level 2   RPM 60   Minutes 15   METs 3.14     NuStep   Level 2   Watts 50   METs 2.6     REL-XR   Level 2   Minutes 15   METs 3     T5 Nustep   Level 2   Minutes 15   METs 2.9     Prescription Details   Frequency (times per week) 3   Duration Progress to 45 minutes of aerobic exercise without signs/symptoms of physical distress     Intensity   THRR 40-80% of Max Heartrate 104-141   Ratings of Perceived Exertion 11-15     Progression   Progression Continue to progress workloads to maintain intensity without signs/symptoms of physical distress.     Resistance Training   Training Prescription Yes   Weight 3      Perform Capillary Blood Glucose checks as needed.  Exercise Prescription Changes:   Exercise Comments:   Discharge Exercise Prescription (Final Exercise Prescription Changes):   Nutrition:  Target Goals: Understanding of nutrition guidelines, daily intake of sodium <1563m, cholesterol <2086m calories 30% from fat and 7% or less from saturated fats, daily to have 5 or more servings of fruits and vegetables.  Biometrics:     Pre Biometrics - 07/03/16 1316      Pre Biometrics   Height 5' 11.5" (1.816 m)   Weight 242 lb 1.6 oz (109.8 kg)   Waist Circumference 45.5 inches   Hip Circumference 47.5 inches   Waist to Hip Ratio 0.96 %   BMI (Calculated) 33.4   Single Leg Stand 30 seconds       Nutrition Therapy Plan and Nutrition Goals:      Nutrition Therapy & Goals - 07/03/16 1351      Nutrition Therapy   Drug/Food  Interactions Statins/Certain Fruits     Personal Nutrition Goals   Comments Matthew Doyle said his wife fixes all his meals. He usually takes left overs for lunch to work.       Nutrition Discharge: Rate Your Plate Scores:   Nutrition Goals Re-Evaluation:   Psychosocial: Target Goals: Acknowledge presence or absence of depression, maximize coping skills, provide positive support system. Participant is able to verbalize types and ability to use techniques and skills needed for reducing stress and depression.  Initial Review & Psychosocial Screening:     Initial Psych Review & Screening - 07/03/16 [place]? Yes   Comments Matthew Doyle's left shoulder fell off a bus years ago and landed on his shoulder so Matthew Doyle can't lift it up over his head. Matthew Doyle reports  "both of my knees are bad". Matthew Doyle reports he has 6 months of PTO time so is getting paid for being off work. He also said he does not have a copay for Cardiac Rehab since he has met his $5000 deducttible. 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      Screening Interventions   Interventions Encouraged to exercise      Quality of Life Scores:   PHQ-9: Recent Review Flowsheet Data    Depression screen PHQ 2/9 07/03/2016   Decreased Interest 0   Down, Depressed, Hopeless 0   PHQ - 2 Score 0   Altered sleeping 3   Tired, decreased energy 3   Change in appetite 3   Feeling bad or failure about yourself  1   Trouble concentrating 1   Moving slowly or fidgety/restless 0   Suicidal thoughts 0   PHQ-9 Score 11   Difficult doing work/chores Somewhat difficult      Psychosocial Evaluation and Intervention:   Psychosocial  Re-Evaluation:   Vocational Rehabilitation: Provide vocational rehab assistance to qualifying candidates.   Vocational Rehab Evaluation & Intervention:   Education: Education Goals: Education classes will be provided on a weekly basis, covering required topics. Participant will state understanding/return demonstration of topics presented.  Learning Barriers/Preferences:   Education Topics: General Nutrition Guidelines/Fats and Fiber: -Group instruction provided by verbal, written material, models and posters to present the general guidelines for heart healthy nutrition. Gives an explanation and review of dietary fats and fiber.   Controlling Sodium/Reading Food Labels: -Group verbal and written material supporting the discussion of sodium use in heart healthy nutrition. Review and explanation with models, verbal and written materials for utilization of the food label.   Exercise Physiology & Risk Factors: - Group verbal and written instruction with models to review the exercise physiology of the cardiovascular system and associated critical values. Details cardiovascular disease risk factors and the goals associated with each risk factor.   Aerobic Exercise & Resistance Training: - Gives group verbal and written discussion on the health impact of inactivity. On the components of aerobic and resistive training programs and the benefits of this training and how to safely progress through these programs.   Flexibility, Balance, General Exercise Guidelines: - Provides group verbal and written instruction on the benefits of flexibility and balance training programs. Provides general exercise guidelines with specific guidelines to those with heart or lung disease. Demonstration  and skill practice provided.   Stress Management: - Provides group verbal and written instruction about the health risks of elevated stress, cause of high stress, and healthy ways to reduce  stress.   Depression: - Provides group verbal and written instruction on the correlation between heart/lung disease and depressed mood, treatment options, and the stigmas associated with seeking treatment.   Anatomy & Physiology of the Heart: - Group verbal and written instruction and models provide basic cardiac anatomy and physiology, with the coronary electrical and arterial systems. Review of: AMI, Angina, Valve disease, Heart Failure, Cardiac Arrhythmia, Pacemakers, and the ICD.   Cardiac Procedures: - Group verbal and written instruction and models to describe the testing methods done to diagnose heart disease. Reviews the outcomes of the test results. Describes the treatment choices: Medical Management, Angioplasty, or Coronary Bypass Surgery.   Cardiac Medications: - Group verbal and written instruction to review commonly prescribed medications for heart disease. Reviews the medication, class of the drug, and side effects. Includes the steps to properly store meds and maintain the prescription regimen.   Go Sex-Intimacy & Heart Disease, Get SMART - Goal Setting: - Group verbal and written instruction through game format to discuss heart disease and the return to sexual intimacy. Provides group verbal and written material to discuss and apply goal setting through the application of the S.M.A.R.T. Method.   Other Matters of the Heart: - Provides group verbal, written materials and models to describe Heart Failure, Angina, Valve Disease, and Diabetes in the realm of heart disease. Includes description of the disease process and treatment options available to the cardiac patient.   Exercise & Equipment Safety: - Individual verbal instruction and demonstration of equipment use and safety with use of the equipment. Flowsheet Row Cardiac Rehab from 07/03/2016 in [facility] Cardiac and Pulmonary Rehab  Date  07/03/16  Educator  C. Kitty Cadavid,RN  Instruction Review Code  1- partially meets,  needs review/practice      Infection Prevention: - Provides verbal and written material to individual with discussion of infection control including proper hand washing and proper equipment cleaning during exercise session. Flowsheet Row Cardiac Rehab from 07/03/2016 in [facility] Cardiac and Pulmonary Rehab  Date  07/03/16  Educator  C. EnterkinRN  Instruction Review Code  2- meets goals/outcomes      Falls Prevention: - Provides verbal and written material to individual with discussion of falls prevention and safety. Flowsheet Row Cardiac Rehab from 07/03/2016 in [facility] Cardiac and Pulmonary Rehab  Date  07/03/16  Educator  C. Kylei Purington,RN  Instruction Review Code  2- meets goals/outcomes      Diabetes: - Individual verbal and written instruction to review signs/symptoms of diabetes, desired ranges of glucose level fasting, after meals and with exercise. Advice that pre and post exercise glucose checks will be done for 3 sessions at entry of program.    Knowledge Questionnaire Score:   Core Components/Risk Factors/Patient Goals at Admission:     Personal Goals and Risk Factors at Admission - 07/03/16 1352      Core Components/Risk Factors/Patient Goals on Admission    Weight Management Yes   Intervention Weight Management: Provide education and appropriate resources to help participant work on and attain dietary goals.   Admit Weight 242 lb (109.8 kg)   Goal Weight: Short Term 232 lb (105.2 kg)   Goal Weight: Long Term 205 lb (93 kg)   Expected Outcomes Short Term: Continue to assess and modify interventions until short term weight is achieved;Weight Maintenance:  Understanding of the daily nutrition guidelines, which includes 25-35% calories from fat, 7% or less cal from saturated fats, less than '200mg'$  cholesterol, less than 1.5gm of sodium, & 5 or more servings of fruits and vegetables daily;Weight Loss: Understanding of general recommendations for a balanced deficit meal plan, which  promotes 1-2 lb weight loss per week and includes a negative energy balance of [phone] kcal/d;Understanding recommendations for meals to include 15-35% energy as protein, 25-35% energy from fat, 35-60% energy from carbohydrates, less than '200mg'$  of dietary cholesterol, 20-35 gm of total fiber daily;Understanding of distribution of calorie intake throughout the day with the consumption of 4-5 meals/snacks;Weight Gain: Understanding of general recommendations for a high calorie, high protein meal plan that promotes weight gain by distributing calorie intake throughout the day with the consumption for 4-5 meals, snacks, and/or supplements   Increase Strength and Stamina Yes   Intervention Provide advice, education, support and counseling about physical activity/exercise needs.;Develop an individualized exercise prescription for aerobic and resistive training based on initial evaluation findings, risk stratification, comorbidities and participant's personal goals.   Expected Outcomes Achievement of increased cardiorespiratory fitness and enhanced flexibility, muscular endurance and strength shown through measurements of functional capacity and personal statement of participant.   Lipids Yes   Intervention Provide education and support for participant on nutrition & aerobic/resistive exercise along with prescribed medications to achieve LDL '70mg'$ , HDL >'40mg'$ .   Expected Outcomes Short Term: Participant states understanding of desired cholesterol values and is compliant with medications prescribed. Participant is following exercise prescription and nutrition guidelines.;Long Term: Cholesterol controlled with medications as prescribed, with individualized exercise RX and with personalized nutrition plan. Value goals: LDL < '70mg'$ , HDL > 40 mg.      Core Components/Risk Factors/Patient Goals Review:    Core Components/Risk Factors/Patient Goals at Discharge (Final Review):    ITP Comments:     ITP Comments     Row Name 07/03/16 1348 07/03/16 1351 07/03/16 1354       ITP Comments Matthew Doyle's left shoulder fell off a bus years ago and landed on his shoulder so Matthew Doyle can't lift it up over his head. Matthew Doyle reports  "both of my knees are bad". Matthew Doyle reports he has 6 months of PTO time so is getting paid for being off work. He also said he does not have a copay for Cardiac Rehab since he has met his $5000 deducttible. 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  Matthew Doyle said his wife fixes all his meals. He usually takes left overs for lunch to work.  I emphasized the importance of Matthew Doyle to carry his NTG sl. I showed him a metal pill container that I carry on my key ring. Matthew Doyle said he had a prescription for NTG after his first heart attack but was not carrying it when he had this last heart attack.         Comments:

## 2016-07-03 NOTE — Telephone Encounter (Signed)
Patient's wife calling in regards to [facility] paper work to see if it has been completed. Please call her.

## 2016-07-03 NOTE — Telephone Encounter (Signed)
Paperwork is on Dr. Tyrell Antonio desk awaiting review.

## 2016-07-03 NOTE — Patient Instructions (Signed)
Patient Instructions  Patient Details  Name: Matthew Doyle MRN: [id number] Date of Birth: 27-Mar-1956 Referring Provider:  Wellington Hampshire, MD  Below are the personal goals you chose as well as exercise and nutrition goals. Our goal is to help you keep on track towards obtaining and maintaining your goals. We will be discussing your progress on these goals with you throughout the program.  Initial Exercise Prescription:     Initial Exercise Prescription - 07/03/16 1300      Date of Initial Exercise RX and Referring Provider   Date 07/03/16   Referring Provider Arida     Treadmill   MPH 2.6   Grade 0.5   Minutes 15   METs 3.17     Bike   Level 1.3   Minutes 15     Recumbant Bike   Level 2   RPM 60   Minutes 15   METs 3.14     NuStep   Level 2   Watts 50   METs 2.6     REL-XR   Level 2   Minutes 15   METs 3     T5 Nustep   Level 2   Minutes 15   METs 2.9     Prescription Details   Frequency (times per week) 3   Duration Progress to 45 minutes of aerobic exercise without signs/symptoms of physical distress     Intensity   THRR 40-80% of Max Heartrate 104-141   Ratings of Perceived Exertion 11-15     Progression   Progression Continue to progress workloads to maintain intensity without signs/symptoms of physical distress.     Resistance Training   Training Prescription Yes   Weight 3      Exercise Goals: Frequency: Be able to perform aerobic exercise three times per week working toward 3-5 days per week.  Intensity: Work with a perceived exertion of 11 (fairly light) - 15 (hard) as tolerated. Follow your new exercise prescription and watch for changes in prescription as you progress with the program. Changes will be reviewed with you when they are made.  Duration: You should be able to do 30 minutes of continuous aerobic exercise in addition to a 5 minute warm-up and a 5 minute cool-down routine.  Nutrition Goals: Your personal nutrition goals  will be established when you do your nutrition analysis with the dietician.  The following are nutrition guidelines to follow: Cholesterol < 200mg /day Sodium < 1500mg /day Fiber: Men over 50 yrs - 30 grams per day  Personal Goals:     Personal Goals and Risk Factors at Admission - 07/03/16 1352      Core Components/Risk Factors/Patient Goals on Admission    Weight Management Yes   Intervention Weight Management: Provide education and appropriate resources to help participant work on and attain dietary goals.   Admit Weight 242 lb (109.8 kg)   Goal Weight: Short Term 232 lb (105.2 kg)   Goal Weight: Long Term 205 lb (93 kg)   Expected Outcomes Short Term: Continue to assess and modify interventions until short term weight is achieved;Weight Maintenance: Understanding of the daily nutrition guidelines, which includes 25-35% calories from fat, 7% or less cal from saturated fats, less than 200mg  cholesterol, less than 1.5gm of sodium, & 5 or more servings of fruits and vegetables daily;Weight Loss: Understanding of general recommendations for a balanced deficit meal plan, which promotes 1-2 lb weight loss per week and includes a negative energy balance of [phone] kcal/d;Understanding recommendations for  meals to include 15-35% energy as protein, 25-35% energy from fat, 35-60% energy from carbohydrates, less than 200mg  of dietary cholesterol, 20-35 gm of total fiber daily;Understanding of distribution of calorie intake throughout the day with the consumption of 4-5 meals/snacks;Weight Gain: Understanding of general recommendations for a high calorie, high protein meal plan that promotes weight gain by distributing calorie intake throughout the day with the consumption for 4-5 meals, snacks, and/or supplements   Increase Strength and Stamina Yes   Intervention Provide advice, education, support and counseling about physical activity/exercise needs.;Develop an individualized exercise prescription for  aerobic and resistive training based on initial evaluation findings, risk stratification, comorbidities and participant's personal goals.   Expected Outcomes Achievement of increased cardiorespiratory fitness and enhanced flexibility, muscular endurance and strength shown through measurements of functional capacity and personal statement of participant.   Lipids Yes   Intervention Provide education and support for participant on nutrition & aerobic/resistive exercise along with prescribed medications to achieve LDL 70mg , HDL >40mg .   Expected Outcomes Short Term: Participant states understanding of desired cholesterol values and is compliant with medications prescribed. Participant is following exercise prescription and nutrition guidelines.;Long Term: Cholesterol controlled with medications as prescribed, with individualized exercise RX and with personalized nutrition plan. Value goals: LDL < 70mg , HDL > 40 mg.      Tobacco Use Initial Evaluation: History  Smoking Status  . Never Smoker  Smokeless Tobacco  . Never Used    Copy of goals given to participant.

## 2016-07-04 NOTE — Telephone Encounter (Signed)
Pt wife is calling back regarding FMLA paperwork. States Monday 8/7 is the last day he can be out of work please call and advise of status.

## 2016-07-04 NOTE — Telephone Encounter (Signed)
Dr. Fletcher Anon will complete pt FMLA paperwork today. Notified pt wife, Matthew Doyle, that this will be ready for pick up Monday. She is appreciative of the call with no further questions at this time.

## 2016-07-07 ENCOUNTER — Encounter: Payer: BC Managed Care – PPO | Admitting: *Deleted

## 2016-07-07 DIAGNOSIS — Z955 Presence of coronary angioplasty implant and graft: Secondary | ICD-10-CM

## 2016-07-07 NOTE — Telephone Encounter (Signed)
FMLA Paperwork left at front desk for pick up. Pt aware.

## 2016-07-07 NOTE — Progress Notes (Signed)
Daily Session Note  Patient Details  Name: Matthew Doyle MRN: [id number] Date of Birth: 1956/07/13 Referring Provider:   Flowsheet Row Cardiac Rehab from 07/03/2016 in [facility] Cardiac and Pulmonary Rehab  Referring Provider  Arida      Encounter Date: 07/07/2016  Check In:     Session Check In - 07/07/16 1752      Check-In   Location [facility]-Cardiac & Pulmonary Rehab   Staff Present Earlean Shawl, BS, ACSM CEP, Exercise Physiologist;Amanda Oletta Darter, BA, ACSM CEP, Exercise Physiologist;Diane Joya Gaskins, RN, BSN   Supervising physician immediately available to respond to emergencies See telemetry face sheet for immediately available ER MD   Medication changes reported     No   Fall or balance concerns reported    No   Warm-up and Cool-down Performed on first and last piece of equipment   Resistance Training Performed Yes   VAD Patient? No     Pain Assessment   Currently in Pain? No/denies   Multiple Pain Sites No         Goals Met:  Independence with exercise equipment Exercise tolerated well Personal goals reviewed No report of cardiac concerns or symptoms Strength training completed today  Goals Unmet:  Not Applicable  Comments: First full day of exercise!  Patient was oriented to gym and equipment including functions, settings, policies, and procedures.  Patient's individual exercise prescription and treatment plan were reviewed.  All starting workloads were established based on the results of the 6 minute walk test done at initial orientation visit.  The plan for exercise progression was also introduced and progression will be customized based on patient's performance and goals.    Dr. Emily Filbert is Medical Director for [place] and [facility].

## 2016-07-09 ENCOUNTER — Encounter: Payer: BC Managed Care – PPO | Admitting: *Deleted

## 2016-07-09 DIAGNOSIS — Z955 Presence of coronary angioplasty implant and graft: Secondary | ICD-10-CM

## 2016-07-09 NOTE — Progress Notes (Signed)
Daily Session Note  Patient Details  Name: [name] MRN: [id number] Date of Birth: 08/17/1956 Referring Provider:   Flowsheet Row Cardiac Rehab from 07/03/2016 in [facility] Cardiac and Pulmonary Rehab  Referring Provider  Arida      Encounter Date: 07/09/2016  Check In:     Session Check In - 07/09/16 1628      Check-In   Location [facility]   Staff Present Susanne Bice, RN, BSN, CCRP;Amanda Sommer, BA, ACSM CEP, Exercise Physiologist;Diane Wright, RN, BSN   Supervising physician immediately available to respond to emergencies See telemetry face sheet for immediately available ER MD   Medication changes reported     No   Fall or balance concerns reported    No   Warm-up and Cool-down Performed on first and last piece of equipment   Resistance Training Performed Yes   VAD Patient? No     Pain Assessment   Currently in Pain? No/denies         Goals Met:  Exercise tolerated well No report of cardiac concerns or symptoms Strength training completed today  Goals Unmet:  Not Applicable  Comments:  Pt able to follow exercise prescription today without complaint.  Will continue to monitor for progression.    Dr. Mark Miller is Medical Director for [facility] and [facility]. 

## 2016-07-10 ENCOUNTER — Encounter: Payer: BC Managed Care – PPO | Admitting: *Deleted

## 2016-07-10 DIAGNOSIS — Z955 Presence of coronary angioplasty implant and graft: Secondary | ICD-10-CM

## 2016-07-10 NOTE — Progress Notes (Signed)
Daily Session Note  Patient Details  Name: Matthew Doyle MRN: [id number] Date of Birth: 04-26-1956 Referring Provider:   Flowsheet Row Cardiac Rehab from 07/03/2016 in [facility] Card[facility] Pul[facility] Rehab  Referring Provider  Arida      Encounter Date: 07/10/2016  Check In:     Session Check In - 07/10/16 1743      Check-In   Location [facility]-Cardiac & Pul[facility] Re[facility]   Staff Present Heath Lark, RN, BSN, Lance Sell, BA, ACSM CEP, Exercise Physiologist;Diane Joya Gaskins, RN, BSN   Supervising physician immediately available to respond to emergencies See telemetry face sheet for immediately available ER MD   Medication changes reported     No   Fall or balance concerns reported    No   Warm-up and Cool-down Performed on first and last piece of equipment   Resistance Training Performed Yes   VAD Patient? No     Pain Assessment   Currently in Pain? No/denies           Exercise Prescription Changes - 07/10/16 1400      Exercise Review   Progression Yes     Response to Exercise   Blood Pressure (Admit) 120/80   Blood Pressure (Exercise) 168/84   Blood Pressure (Exit) 122/70   Heart Rate (Admit) 80 bpm   Heart Rate (Exercise) 124 bpm   Heart Rate (Exit) 77 bpm   Rating of Perceived Exertion (Exercise) 10   Symptoms no   Duration Progress to 45 minutes of aerobic exercise without signs/symptoms of physical distress   Intensity THRR unchanged     Progression   Progression Continue to progress workloads to maintain intensity without signs/symptoms of physical distress.   Average METs 3.26     Resistance Training   Training Prescription Yes   Weight 3     Treadmill   MPH 3   Grade 0.5   Minutes 15   METs 3.5     NuStep   Level 4   METs 2.8      Goals Met:  Independence with exercise equipment Exercise tolerated well No report of cardiac concerns or symptoms Strength training completed today  Goals Unmet:  Not Applicable  Comments:  Pt able to  follow exercise prescription today without complaint.  Will continue to monitor for progression.    Dr. Emily Filbert is Medical Director for [place] and [facility].

## 2016-07-14 ENCOUNTER — Encounter: Payer: BC Managed Care – PPO | Admitting: *Deleted

## 2016-07-14 DIAGNOSIS — Z955 Presence of coronary angioplasty implant and graft: Secondary | ICD-10-CM

## 2016-07-14 NOTE — Progress Notes (Signed)
Daily Session Note  Patient Details  Name: Matthew Doyle MRN: [id number] Date of Birth: Sep 16, 1956 Referring Provider:   Flowsheet Row Cardiac Rehab from 07/03/2016 in [facility] Cardiac [facility]monary Rehab  Referring Provider  Arida      Encounter Date: 07/14/2016  Check In:     Session Check In - 07/14/16 1624      Check-In   Location [facility]-Cardiac & Pulmonary Rehab   Staff Present Earlean Shawl, BS, ACSM CEP, Exercise Physiologist;Amanda Oletta Darter, BA, ACSM CEP, Exercise Physiologist;Diane Joya Gaskins, RN, BSN   Supervising physician immediately available to respond to emergencies See telemetry face sheet for immediately available ER MD   Medication changes reported     No   Fall or balance concerns reported    No   Warm-up and Cool-down Performed on first and last piece of equipment   Resistance Training Performed Yes   VAD Patient? No     Pain Assessment   Currently in Pain? No/denies   Multiple Pain Sites No         Goals Met:  Independence with exercise equipment Exercise tolerated well No report of cardiac concerns or symptoms Strength training completed today  Goals Unmet:  Not Applicable  Comments: Pt able to follow exercise prescription today without complaint.  Will continue to monitor for progression.    Dr. Emily Filbert is Medical Director for [place] and [facility].

## 2016-07-16 ENCOUNTER — Encounter: Payer: Self-pay | Admitting: *Deleted

## 2016-07-16 ENCOUNTER — Encounter: Payer: BC Managed Care – PPO | Admitting: *Deleted

## 2016-07-16 DIAGNOSIS — Z955 Presence of coronary angioplasty implant and graft: Secondary | ICD-10-CM

## 2016-07-16 NOTE — Progress Notes (Signed)
Daily Session Note  Patient Details  Name: Matthew Doyle MRN: [id number] Date of Birth: 05/03/1956 Referring Provider:   Flowsheet Row Cardiac Rehab from 07/03/2016 in [facility] Cardiac and Pulmonary Rehab  Referring Provider  Arida      Encounter Date: 07/16/2016  Check In:     Session Check In - 07/16/16 1639      Check-In   Location [facility]-Cardiac & Pulmonary Rehab   Staff Present Gerlene Burdock, RN, BSN;Diane Joya Gaskins, RN, Vickki Hearing, BA, ACSM CEP, Exercise Physiologist   Supervising physician immediately available to respond to emergencies See telemetry face sheet for immediately available ER MD   Warm-up and Cool-down Performed on first and last piece of equipment     Pain Assessment   Currently in Pain? No/denies         Goals Met:  Proper associated with RPD/PD & O2 Sat Exercise tolerated well  Goals Unmet:  Not Applicable  Comments:     Dr. Emily Filbert is Medical Director for [place] and [facility].

## 2016-07-16 NOTE — Progress Notes (Signed)
Cardiac Individual Treatment Plan  Patient Details  Name: Matthew Doyle MRN: [id number] Date of Birth: 1956/09/10 Referring Provider:   Flowsheet Row Cardiac Rehab from 07/03/2016 in [facility] Cardiac and Pulmonary Rehab  Referring Provider  Arida      Initial Encounter Date:  Flowsheet Row Cardiac Rehab from 07/03/2016 in [facility] Cardiac and Pulmonary Rehab  Date  07/03/16  Referring Provider  Fletcher Anon      Visit Diagnosis: S/P coronary artery stent placement  Patient's Home Medications on Admission:  Current Outpatient Prescriptions:  .  aspirin 81 MG tablet, Take 1 tablet (81 mg total) by mouth daily., Disp: 30 tablet, Rfl:  .  atorvastatin (LIPITOR) 80 MG tablet, Take 1 tablet (80 mg total) by mouth daily at 6 PM., Disp: 90 tablet, Rfl: 3 .  ezetimibe (ZETIA) 10 MG tablet, Take 1 tablet (10 mg total) by mouth daily., Disp: 90 tablet, Rfl: 3 .  losartan (COZAAR) 100 MG tablet, TAKE ONE TABLET BY MOUTH ONCE DAILY, Disp: 90 tablet, Rfl: 3 .  nitroGLYCERIN (NITROSTAT) 0.4 MG SL tablet, Place 1 tablet (0.4 mg total) under the tongue every 5 (five) minutes as needed for chest pain., Disp: 25 tablet, Rfl: 3 .  ondansetron (ZOFRAN) 4 MG tablet, Take 1 tablet (4 mg total) by mouth every 8 (eight) hours as needed for nausea or vomiting. (Patient not taking: Reported on 07/03/2016), Disp: 20 tablet, Rfl: 0 .  ticagrelor (BRILINTA) 90 MG TABS tablet, Take 1 tablet (90 mg total) by mouth 2 (two) times daily., Disp: 180 tablet, Rfl: 3  Past Medical History: Past Medical History:  Diagnosis Date  . Arthritis    knee  . Atrial fibrillation ([place]) post MI   . CAD (coronary artery disease)    a. 09/2003 Cath/PCI: LAD 80%->3.0x23 Cypher DES;  b. 03/2004 Cath ;  c. NSTEMI 12/2012 cath patent LAD stent, included distal right PDA supplying a small territory, 99% proximal OM 2 status post PCI and DES placement  d. cath 06/20/2016 anterolateral STEMI stent to LAD, PCTA to diag and LCx  . Colon polyps   .  Diverticulosis   . Dysrhythmia   . Gout   . Hemorrhoids   . Hyperlipidemia   . Hypertension   . Internal hemorrhoids without mention of complication   . Myocardial infarction ([place])    LAST 12/16/12  . ST elevation (STEMI) myocardial infarction involving left anterior descending coronary artery ([place]) 06/20/2016    Tobacco Use: History  Smoking Status  . Never Smoker  Smokeless Tobacco  . Never Used    Labs: Recent Review Flowsheet Data    Labs for ITP Cardiac and Pulmonary Rehab Latest Ref Rng & Units 05/26/2014 05/28/2015 03/09/2016 06/20/2016 06/20/2016   Cholestrol 0 - 200 mg/dL 119 118 - 120 -   LDLCALC 0 - 99 mg/dL 72 74 - 70 -   HDL >40 mg/dL 35.50(L) 29.90(L) - 32(L) -   Trlycerides <150 mg/dL 57.0 69.0 - 88 -   Hemoglobin A1c 4.8 - 5.6 % 6.4 - 5.7 6.3(H) 6.3(H)   TCO2 0 - 100 mmol/L - - - 18 -       Exercise Target Goals:    Exercise Program Goal: Individual exercise prescription set with THRR, safety & activity barriers. Participant demonstrates ability to understand and report RPE using BORG scale, to self-measure pulse accurately, and to acknowledge the importance of the exercise prescription.  Exercise Prescription Goal: Starting with aerobic activity 30 plus minutes a day, 3 days per  week for initial exercise prescription. Provide home exercise prescription and guidelines that participant acknowledges understanding prior to discharge.  Activity Barriers & Risk Stratification:     Activity Barriers & Cardiac Risk Stratification - 07/03/16 1346      Activity Barriers & Cardiac Risk Stratification   Activity Barriers Arthritis;Deconditioning;Joint Problems   Comments left shoulder fell off a bus years ago and landed on his shoulder so Matthew Doyle can't lift it up over his head. Matthew Doyle reports  "both of my knees are bad".    Cardiac Risk Stratification High      6 Minute Walk:     6 Minute Walk    Row Name 07/03/16 1317         6 Minute Walk   Distance 1390 feet      Walk Time 6 minutes     MPH 2.63     METS 3.2     RPE 12     VO2 Peak 11.25     Resting HR 67 bpm     Resting BP 130/86     Max Ex. HR 84 bpm     Max Ex. BP 144/92        Initial Exercise Prescription:     Initial Exercise Prescription - 07/03/16 1300      Date of Initial Exercise RX and Referring Provider   Date 07/03/16   Referring Provider Arida     Treadmill   MPH 2.6   Grade 0.5   Minutes 15   METs 3.17     Bike   Level 1.3   Minutes 15     Recumbant Bike   Level 2   RPM 60   Minutes 15   METs 3.14     NuStep   Level 2   Watts 50   METs 2.6     REL-XR   Level 2   Minutes 15   METs 3     T5 Nustep   Level 2   Minutes 15   METs 2.9     Prescription Details   Frequency (times per week) 3   Duration Progress to 45 minutes of aerobic exercise without signs/symptoms of physical distress     Intensity   THRR 40-80% of Max Heartrate 104-141   Ratings of Perceived Exertion 11-15     Progression   Progression Continue to progress workloads to maintain intensity without signs/symptoms of physical distress.     Resistance Training   Training Prescription Yes   Weight 3      Perform Capillary Blood Glucose checks as needed.  Exercise Prescription Changes:     Exercise Prescription Changes    Row Name 07/10/16 1400             Exercise Review   Progression Yes         Response to Exercise   Blood Pressure (Admit) 120/80       Blood Pressure (Exercise) 168/84       Blood Pressure (Exit) 122/70       Heart Rate (Admit) 80 bpm       Heart Rate (Exercise) 124 bpm       Heart Rate (Exit) 77 bpm       Rating of Perceived Exertion (Exercise) 10       Symptoms no       Duration Progress to 45 minutes of aerobic exercise without signs/symptoms of physical distress       Intensity THRR unchanged  Progression   Progression Continue to progress workloads to maintain intensity without signs/symptoms of physical distress.        Average METs 3.26         Resistance Training   Training Prescription Yes       Weight 3         Treadmill   MPH 3       Grade 0.5       Minutes 15       METs 3.5         NuStep   Level 4       METs 2.8          Exercise Comments:     Exercise Comments    Row Name 07/07/16 1753 07/10/16 1440         Exercise Comments First full day of exercise!  Patient was oriented to gym and equipment including functions, settings, policies, and procedures.  Patient's individual exercise prescription and treatment plan were reviewed.  All starting workloads were established based on the results of the 6 minute walk test done at initial orientation visit.  The plan for exercise progression was also introduced and progression will be customized based on patient's performance and goals Matthew Doyle is progressing well with execise in his first week.         Discharge Exercise Prescription (Final Exercise Prescription Changes):     Exercise Prescription Changes - 07/10/16 1400      Exercise Review   Progression Yes     Response to Exercise   Blood Pressure (Admit) 120/80   Blood Pressure (Exercise) 168/84   Blood Pressure (Exit) 122/70   Heart Rate (Admit) 80 bpm   Heart Rate (Exercise) 124 bpm   Heart Rate (Exit) 77 bpm   Rating of Perceived Exertion (Exercise) 10   Symptoms no   Duration Progress to 45 minutes of aerobic exercise without signs/symptoms of physical distress   Intensity THRR unchanged     Progression   Progression Continue to progress workloads to maintain intensity without signs/symptoms of physical distress.   Average METs 3.26     Resistance Training   Training Prescription Yes   Weight 3     Treadmill   MPH 3   Grade 0.5   Minutes 15   METs 3.5     NuStep   Level 4   METs 2.8      Nutrition:  Target Goals: Understanding of nutrition guidelines, daily intake of sodium <1560m, cholesterol <2034m calories 30% from fat and 7% or less from saturated fats,  daily to have 5 or more servings of fruits and vegetables.  Biometrics:     Pre Biometrics - 07/03/16 1316      Pre Biometrics   Height 5' 11.5" (1.816 m)   Weight 242 lb 1.6 oz (109.8 kg)   Waist Circumference 45.5 inches   Hip Circumference 47.5 inches   Waist to Hip Ratio 0.96 %   BMI (Calculated) 33.4   Single Leg Stand 30 seconds       Nutrition Therapy Plan and Nutrition Goals:     Nutrition Therapy & Goals - 07/03/16 1351      Nutrition Therapy   Drug/Food Interactions Statins/Certain Fruits     Personal Nutrition Goals   Comments DoTimmothy Soursaid his wife fixes all his meals. He usually takes left overs for lunch to work.       Nutrition Discharge: Rate Your Plate Scores:     Nutrition  Assessments - 07/11/16 0942      Rate Your Plate Scores   Pre Score 57   Pre Score % 63 %      Nutrition Goals Re-Evaluation:   Psychosocial: Target Goals: Acknowledge presence or absence of depression, maximize coping skills, provide positive support system. Participant is able to verbalize types and ability to use techniques and skills needed for reducing stress and depression.  Initial Review & Psychosocial Screening:     Initial Psych Review & Screening - 07/03/16 Darby? Yes   Comments Matthew Doyle's left shoulder fell off a bus years ago and landed on his shoulder so Matthew Doyle can't lift it up over his head. Matthew Doyle reports  "both of my knees are bad". Matthew Doyle reports he has 6 months of PTO time so is getting paid for being off work. He also said he does not have a copay for Cardiac Rehab since he has met his $5000 deducttible. 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      Screening Interventions   Interventions Encouraged to exercise       Quality of Life Scores:   PHQ-9: Recent Review Flowsheet Data    Depression screen PHQ 2/9 07/03/2016   Decreased Interest 0   Down, Depressed, Hopeless 0   PHQ - 2 Score 0   Altered sleeping 3   Tired, decreased energy 3   Change in appetite 3   Feeling bad or failure about yourself  1   Trouble concentrating 1   Moving slowly or fidgety/restless 0   Suicidal thoughts 0   PHQ-9 Score 11   Difficult doing work/chores Somewhat difficult      Psychosocial Evaluation and Intervention:     Psychosocial Evaluation - 07/07/16 1741      Psychosocial Evaluation & Interventions   Comments Counselor met with Mr. Tipler today for initial psychosocial evaluation.  He is a 60 year old who had a heart attack with a stent inserted on 7/21.  This was his third cardiac episode with a stent inserted in 2004 and another in 2014.  Mr. Sidor also has arthritis that he contends with as well.  He has a strong support system with a spouse of 85 years and (2) adult sons who live close by.  Mr. Balkcom states he does not sleep well with intermittent waking and having difficulty going back to sleep.  He says his appetite is better but still not fully back to normal.  Mr. Desmith denies a history of depression or anxiety or any current symptoms.  He states his mood is generally positive and he has minimal stress in his life other than a disabled spouse and finances currently.  Mr. Kurt has goals to get back his stamina and strength while in this program and hopefully start sleeping better.  He plans to join the [facility] and work out with a buddy following this program to maintain consistency in exercise.  Counselor encouraged Mr. Fonda to speak with his Dr. or Pharmacist re: a possible natural OTC sleep aid that is sustained release in order to help with his sleep problems currently.  Counselor will follow with Mr. Kana throughout the course of this program.  Psychosocial Re-Evaluation:   Vocational  Rehabilitation: Provide vocational rehab assistance to qualifying candidates.   Vocational Rehab Evaluation & Intervention:   Education: Education Goals: Education classes will be provided on a weekly basis, covering required topics. Participant will state understanding/return demonstration of topics presented.  Learning Barriers/Preferences:   Education Topics: General Nutrition Guidelines/Fats and Fiber: -Group instruction provided by verbal, written material, models and posters to present the general guidelines for heart healthy nutrition. Gives an explanation and review of dietary fats and fiber.   Controlling Sodium/Reading Food Labels: -Group verbal and written material supporting the discussion of sodium use in heart healthy nutrition. Review and explanation with models, verbal and written materials for utilization of the food label.   Exercise Physiology & Risk Factors: - Group verbal and written instruction with models to review the exercise physiology of the cardiovascular system and associated critical values. Details cardiovascular disease risk factors and the goals associated with each risk factor. Flowsheet Row Cardiac Rehab from 07/14/2016 in [facility] Cardiac and Pulmonary Rehab  Date  07/07/16  Educator  [facility]  Instruction Review Code  2- meets goals/outcomes      Aerobic Exercise & Resistance Training: - Gives group verbal and written discussion on the health impact of inactivity. On the components of aerobic and resistive training programs and the benefits of this training and how to safely progress through these programs. Flowsheet Row Cardiac Rehab from 07/14/2016 in [facility] Cardiac and Pulmonary Rehab  Date  07/09/16  Educator  A. Sommer  Instruction Review Code  2- meets goals/outcomes      Flexibility, Balance, General Exercise Guidelines: - Provides group verbal and written instruction on the benefits of flexibility and balance training programs. Provides general  exercise guidelines with specific guidelines to those with heart or lung disease. Demonstration and skill practice provided. Flowsheet Row Cardiac Rehab from 07/14/2016 in [facility] Cardiac and Pulmonary Rehab  Date  07/14/16  Educator  AS  Instruction Review Code  2- meets goals/outcomes      Stress Management: - Provides group verbal and written instruction about the health risks of elevated stress, cause of high stress, and healthy ways to reduce stress.   Depression: - Provides group verbal and written instruction on the correlation between heart/lung disease and depressed mood, treatment options, and the stigmas associated with seeking treatment.   Anatomy & Physiology of the Heart: - Group verbal and written instruction and models provide basic cardiac anatomy and physiology, with the coronary electrical and arterial systems. Review of: AMI, Angina, Valve disease, Heart Failure, Cardiac Arrhythmia, Pacemakers, and the ICD.   Cardiac Procedures: - Group verbal and written instruction and models to describe the testing methods done to diagnose heart disease. Reviews the outcomes of the test results. Describes the treatment choices: Medical Management, Angioplasty, or Coronary Bypass Surgery.   Cardiac Medications: - Group verbal and written instruction to review commonly prescribed medications for heart disease. Reviews the medication, class of the drug, and side effects. Includes the steps to properly store meds and maintain the prescription regimen.   Go Sex-Intimacy & Heart Disease, Get SMART - Goal Setting: - Group verbal and written instruction through game format to discuss heart disease and the return to sexual intimacy. Provides group verbal and written material to discuss and apply goal setting through the application of the S.M.A.R.T. Method.   Other Matters of the Heart: - Provides group verbal, written materials and models to describe Heart Failure, Angina, Valve Disease,  and Diabetes in the  realm of heart disease. Includes description of the disease process and treatment options available to the cardiac patient.   Exercise & Equipment Safety: - Individual verbal instruction and demonstration of equipment use and safety with use of the equipment. Flowsheet Row Cardiac Rehab from 07/14/2016 in [facility] Cardiac and Pulmonary Rehab  Date  07/03/16  Educator  C. Enterkin,RN  Instruction Review Code  1- partially meets, needs review/practice      Infection Prevention: - Provides verbal and written material to individual with discussion of infection control including proper hand washing and proper equipment cleaning during exercise session. Flowsheet Row Cardiac Rehab from 07/14/2016 in [facility] Cardiac and Pulmonary Rehab  Date  07/03/16  Educator  C. EnterkinRN  Instruction Review Code  2- meets goals/outcomes      Falls Prevention: - Provides verbal and written material to individual with discussion of falls prevention and safety. Flowsheet Row Cardiac Rehab from 07/14/2016 in [facility] Cardiac and Pulmonary Rehab  Date  07/03/16  Educator  C. Enterkin,RN  Instruction Review Code  2- meets goals/outcomes      Diabetes: - Individual verbal and written instruction to review signs/symptoms of diabetes, desired ranges of glucose level fasting, after meals and with exercise. Advice that pre and post exercise glucose checks will be done for 3 sessions at entry of program.    Knowledge Questionnaire Score:   Core Components/Risk Factors/Patient Goals at Admission:     Personal Goals and Risk Factors at Admission - 07/03/16 1352      Core Components/Risk Factors/Patient Goals on Admission    Weight Management Yes   Intervention Weight Management: Provide education and appropriate resources to help participant work on and attain dietary goals.   Admit Weight 242 lb (109.8 kg)   Goal Weight: Short Term 232 lb (105.2 kg)   Goal Weight: Long Term 205 lb (93 kg)    Expected Outcomes Short Term: Continue to assess and modify interventions until short term weight is achieved;Weight Maintenance: Understanding of the daily nutrition guidelines, which includes 25-35% calories from fat, 7% or less cal from saturated fats, less than 229m cholesterol, less than 1.5gm of sodium, & 5 or more servings of fruits and vegetables daily;Weight Loss: Understanding of general recommendations for a balanced deficit meal plan, which promotes 1-2 lb weight loss per week and includes a negative energy balance of [phone] kcal/d;Understanding recommendations for meals to include 15-35% energy as protein, 25-35% energy from fat, 35-60% energy from carbohydrates, less than 208mof dietary cholesterol, 20-35 gm of total fiber daily;Understanding of distribution of calorie intake throughout the day with the consumption of 4-5 meals/snacks;Weight Gain: Understanding of general recommendations for a high calorie, high protein meal plan that promotes weight gain by distributing calorie intake throughout the day with the consumption for 4-5 meals, snacks, and/or supplements   Increase Strength and Stamina Yes   Intervention Provide advice, education, support and counseling about physical activity/exercise needs.;Develop an individualized exercise prescription for aerobic and resistive training based on initial evaluation findings, risk stratification, comorbidities and participant's personal goals.   Expected Outcomes Achievement of increased cardiorespiratory fitness and enhanced flexibility, muscular endurance and strength shown through measurements of functional capacity and personal statement of participant.   Lipids Yes   Intervention Provide education and support for participant on nutrition & aerobic/resistive exercise along with prescribed medications to achieve LDL <70102mHDL >42m98m Expected Outcomes Short Term: Participant states understanding of desired cholesterol values and is  compliant with medications prescribed. Participant is  following exercise prescription and nutrition guidelines.;Long Term: Cholesterol controlled with medications as prescribed, with individualized exercise RX and with personalized nutrition plan. Value goals: LDL < 8m, HDL > 40 mg.      Core Components/Risk Factors/Patient Goals Review:      Goals and Risk Factor Review    Row Name 07/14/16 1805             Core Components/Risk Factors/Patient Goals Review   Personal Goals Review Sedentary;Hypertension;Lipids       Review DTimmothy Soursis in his second week of Heart Track - hewas tired after the first week.  His BP measurements have been good but his cholesterol hasnt been measured recently.  His wife does the cooking and met with the RD to learn healthier techniques.       Expected Outcomes DTimmothy Sourswill continue to improve strength and stamina with regular exercise and see improved measures for BP and cholesterol.          Core Components/Risk Factors/Patient Goals at Discharge (Final Review):      Goals and Risk Factor Review - 07/14/16 1805      Core Components/Risk Factors/Patient Goals Review   Personal Goals Review Sedentary;Hypertension;Lipids   Review DTimmothy Soursis in his second week of Heart Track - hewas tired after the first week.  His BP measurements have been good but his cholesterol hasnt been measured recently.  His wife does the cooking and met with the RD to learn healthier techniques.   Expected Outcomes DTimmothy Sourswill continue to improve strength and stamina with regular exercise and see improved measures for BP and cholesterol.      ITP Comments:     ITP Comments    Row Name 07/03/16 1348 07/03/16 1351 07/03/16 1354 07/16/16 0847     ITP Comments Matthew Doyle's left shoulder fell off a bus years ago and landed on his shoulder so Matthew Doyle can't lift it up over his head. DTimmothy Soursreports  "both of my knees are bad". DTimmothy Soursreports he has 6 months of PTO time so is getting paid for being off work. He also  said he does not have a copay for Cardiac Rehab since he has met his $5000 deducttible. 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 He usually takes left overs for lunch to work.  I emphasized the importance of Matthew Doyle to carry his NTG sl. I showed him a metal pill container that I carry on my key ring. DTimmothy Sourssaid he had a prescription for NTG after his first heart attack but was not carrying it when he had this last heart attack.  30 day review. Continue with ITP unless changes noted by Medical Director at signature of review.       Comments:

## 2016-07-17 DIAGNOSIS — Z955 Presence of coronary angioplasty implant and graft: Secondary | ICD-10-CM

## 2016-07-17 NOTE — Progress Notes (Signed)
Daily Session Note  Patient Details  Name: YONAS BUNDA MRN: [id number] Date of Birth: 01-06-1956 Referring Provider:   Flowsheet Row Cardiac Rehab from 07/03/2016 in [facility] Cardiac and Pulmonary Rehab  Referring Provider  Arida      Encounter Date: 07/17/2016  Check In:     Session Check In - 07/17/16 1747      Check-In   Location [facility]-Cardiac & Pulmonary Rehab   Staff Present Jeanell Sparrow, DPT, Burlene Arnt, BA, ACSM CEP, Exercise Physiologist;Diane Joya Gaskins, RN, BSN   Supervising physician immediately available to respond to emergencies See telemetry face sheet for immediately available ER MD   Medication changes reported     No   Fall or balance concerns reported    No   Warm-up and Cool-down Performed on first and last piece of equipment   Resistance Training Performed Yes   VAD Patient? No     Pain Assessment   Currently in Pain? No/denies   Multiple Pain Sites No         Goals Met:  Independence with exercise equipment  Goals Unmet:  Not Applicable  Comments: Patient completed exercise prescription and all exercise goals during rehab session. The exercise was tolerated well and the patient is progressing in the program.    Dr. Emily Filbert is Medical Director for [place] and [facility].

## 2016-07-21 DIAGNOSIS — Z955 Presence of coronary angioplasty implant and graft: Secondary | ICD-10-CM

## 2016-07-21 NOTE — Progress Notes (Signed)
Daily Session Note  Patient Details  Name: Matthew Doyle MRN: [id number] Date of Birth: [birth year] Referring Provider:   Flowsheet Row Cardiac Rehab from 07/03/2016 in [facility] Cardiac and Pulmonary Rehab  Referring Provider  Arida      Encounter Date: 07/21/2016  Check In:     Session Check In - 07/21/16 1629      Check-In   Staff Present Earlean Shawl, BS, ACSM CEP, Exercise Physiologist;Amanda Oletta Darter, BA, ACSM CEP, Exercise Physiologist;Carroll Enterkin, RN, BSN   Supervising physician immediately available to respond to emergencies See telemetry face sheet for immediately available ER MD   Medication changes reported     No   Fall or balance concerns reported    No   Warm-up and Cool-down Performed on first and last piece of equipment   Resistance Training Performed Yes   VAD Patient? No     Pain Assessment   Currently in Pain? No/denies         Goals Met:  Proper associated with RPD/PD & O2 Sat Independence with exercise equipment No report of cardiac concerns or symptoms Strength training completed today  Goals Unmet:  Not Applicable  Comments: Pt able to follow exercise prescription today without complaint.  Will continue to monitor for progression.    Dr. Emily Filbert is Medical Director for [place] and [facility].

## 2016-07-23 DIAGNOSIS — Z955 Presence of coronary angioplasty implant and graft: Secondary | ICD-10-CM | POA: Diagnosis not present

## 2016-07-23 NOTE — Progress Notes (Signed)
Daily Session Note  Patient Details  Name: Matthew Doyle MRN: [id number] Date of Birth: 1956/06/11 Referring Provider:   Flowsheet Row Cardiac Rehab from 07/03/2016 in [facility] Cardiac and Pulmonary Rehab  Referring Provider  Arida      Encounter Date: 07/23/2016  Check In:     Session Check In - 07/23/16 1707      Check-In   Location [facility]-Cardiac & Pulmonary Rehab   Staff Present Nyoka Cowden, RN, BSN, Willette Pa, MA, ACSM RCEP, Exercise Physiologist;Amanda Oletta Darter, [place], ACSM CEP, Exercise Physiologist   Supervising physician immediately available to respond to emergencies See telemetry face sheet for immediately available ER MD   Medication changes reported     No   Fall or balance concerns reported    No   Warm-up and Cool-down Performed on first and last piece of equipment   Resistance Training Performed Yes   VAD Patient? No     Pain Assessment   Currently in Pain? No/denies   Multiple Pain Sites No         Goals Met:  Independence with exercise equipment Exercise tolerated well Personal goals reviewed No report of cardiac concerns or symptoms Strength training completed today  Goals Unmet:  Not Applicable  Comments: Pt able to follow exercise prescription today without complaint.  Will continue to monitor for progression.    Dr. Emily Filbert is Medical Director for [place] and [facility].

## 2016-07-24 ENCOUNTER — Encounter: Payer: BC Managed Care – PPO | Admitting: *Deleted

## 2016-07-24 DIAGNOSIS — Z955 Presence of coronary angioplasty implant and graft: Secondary | ICD-10-CM

## 2016-07-24 NOTE — Progress Notes (Signed)
Daily Session Note  Patient Details  Name: Matthew Doyle MRN: [id number] Date of Birth: Apr 01, 1956 Referring Provider:   Flowsheet Row Cardiac Rehab from 07/03/2016 in [facility] Cardiac and Pulmonary Rehab  Referring Provider  Arida      Encounter Date: 07/24/2016  Check In:     Session Check In - 07/24/16 1731      Check-In   Staff Present Heath Lark, RN, BSN, CCRP;Mary Kellie Shropshire, RN, BSN, Francene Boyers, DPT, [place] physician immediately available to respond to emergencies See telemetry face sheet for immediately available ER MD   Medication changes reported     No   Fall or balance concerns reported    No   Warm-up and Cool-down Performed on first and last piece of equipment   Resistance Training Performed Yes   VAD Patient? No     Pain Assessment   Currently in Pain? No/denies         Goals Met:  Exercise tolerated well No report of cardiac concerns or symptoms Strength training completed today  Goals Unmet:  Not Applicable  Comments: Doing well with exercise prescription progression.    Dr. Emily Filbert is Medical Director for [place] and [facility].

## 2016-07-28 DIAGNOSIS — Z955 Presence of coronary angioplasty implant and graft: Secondary | ICD-10-CM | POA: Diagnosis not present

## 2016-07-28 NOTE — Progress Notes (Signed)
Daily Session Note  Patient Details  Name: Matthew Doyle MRN: [id number] Date of Birth: February 15, 1956 Referring Provider:   Flowsheet Row Cardiac Rehab from 07/03/2016 in [facility] Cardiac and Pulmonary Rehab  Referring Provider  Arida      Encounter Date: 07/28/2016  Check In:     Session Check In - 07/28/16 1639      Check-In   Location [facility]-Cardiac & Pulmonary Rehab   Staff Present Nyoka Cowden, RN, BSN, [place], BS, ACSM CEP, Exercise Physiologist;Amanda Oletta Darter, [place], ACSM CEP, Exercise Physiologist   Supervising physician immediately available to respond to emergencies See telemetry face sheet for immediately available ER MD   Medication changes reported     No   Fall or balance concerns reported    No   Warm-up and Cool-down Performed on first and last piece of equipment   Resistance Training Performed Yes   VAD Patient? No     Pain Assessment   Currently in Pain? No/denies   Multiple Pain Sites No         Goals Met:  Independence with exercise equipment Exercise tolerated well No report of cardiac concerns or symptoms Strength training completed today  Goals Unmet:  Not Applicable  Comments: Pt able to follow exercise prescription today without complaint.  Will continue to monitor for progression.    Dr. Emily Filbert is Medical Director for [place] and [facility].

## 2016-07-30 ENCOUNTER — Ambulatory Visit (INDEPENDENT_AMBULATORY_CARE_PROVIDER_SITE_OTHER): Payer: BC Managed Care – PPO | Admitting: Internal Medicine

## 2016-07-30 ENCOUNTER — Encounter: Payer: Self-pay | Admitting: Internal Medicine

## 2016-07-30 VITALS — BP 122/78 | HR 66 | Temp 98.5°F | Ht 71.0 in | Wt 232.0 lb

## 2016-07-30 DIAGNOSIS — Z0000 Encounter for general adult medical examination without abnormal findings: Secondary | ICD-10-CM

## 2016-07-30 DIAGNOSIS — Z955 Presence of coronary angioplasty implant and graft: Secondary | ICD-10-CM

## 2016-07-30 DIAGNOSIS — I2583 Coronary atherosclerosis due to lipid rich plaque: Secondary | ICD-10-CM

## 2016-07-30 DIAGNOSIS — I2510 Atherosclerotic heart disease of native coronary artery without angina pectoris: Secondary | ICD-10-CM | POA: Diagnosis not present

## 2016-07-30 DIAGNOSIS — Z23 Encounter for immunization: Secondary | ICD-10-CM

## 2016-07-30 NOTE — Addendum Note (Signed)
Addended by: Pilar Grammes on: 07/30/2016 04:12 PM   Modules accepted: Orders

## 2016-07-30 NOTE — Assessment & Plan Note (Signed)
Will defer PSA Colon due 2019 Will give prevnar given cardiac history now

## 2016-07-30 NOTE — Assessment & Plan Note (Signed)
Frightening event but his mood is okay In cardiac rehab Cardiology follow up Disability till later in September

## 2016-07-30 NOTE — Progress Notes (Signed)
Daily Session Note  Patient Details  Name: Matthew Doyle MRN: [id number] Date of Birth: 04-21-1956 Referring Provider:   Flowsheet Row Cardiac Rehab from 07/03/2016 in [facility] Cardiac and Pulmonary Rehab  Referring Provider  Arida      Encounter Date: 07/30/2016  Check In:     Session Check In - 07/30/16 1713      Check-In   Location [facility]-Cardiac & Pulmonary Rehab   Staff Present Heath Lark, RN, BSN, Lance Sell, BA, ACSM CEP, Exercise Physiologist;Carroll Enterkin, RN, BSN   Supervising physician immediately available to respond to emergencies See telemetry face sheet for immediately available ER MD   Medication changes reported     No   Fall or balance concerns reported    No   Warm-up and Cool-down Performed on first and last piece of equipment   Resistance Training Performed Yes   VAD Patient? No     Pain Assessment   Currently in Pain? No/denies   Multiple Pain Sites No         Goals Met:  Independence with exercise equipment Exercise tolerated well No report of cardiac concerns or symptoms Strength training completed today  Goals Unmet:  Not Applicable  Comments: Pt able to follow exercise prescription today without complaint.  Will continue to monitor for progression.    Dr. Emily Filbert is Medical Director for [place] and [facility].

## 2016-07-30 NOTE — Progress Notes (Signed)
Subjective:    Patient ID: Matthew Doyle, male    DOB: [birth year], 60 y.o.   MRN: [id number]  HPI Here for physical  Reviewed recent cardiac event No chest pain or SOB Has been going to cardiac rehab Hoping to go back to work 9/20 Still feels a lack of stamina Emotionally doing okay---he doesn't feel depressed  Tolerating the medications  Will notice a little shake in his hands and legs at times--he can make it stop May have had slight tremor before GF did have tremor--but in 80's  Current Outpatient Prescriptions on File Prior to Visit  Medication Sig Dispense Refill  . aspirin 81 MG tablet Take 1 tablet (81 mg total) by mouth daily. 30 tablet   . atorvastatin (LIPITOR) 80 MG tablet Take 1 tablet (80 mg total) by mouth daily at 6 PM. 90 tablet 3  . ezetimibe (ZETIA) 10 MG tablet Take 1 tablet (10 mg total) by mouth daily. 90 tablet 3  . losartan (COZAAR) 100 MG tablet TAKE ONE TABLET BY MOUTH ONCE DAILY 90 tablet 3  . nitroGLYCERIN (NITROSTAT) 0.4 MG SL tablet Place 1 tablet (0.4 mg total) under the tongue every 5 (five) minutes as needed for chest pain. 25 tablet 3  . ondansetron (ZOFRAN) 4 MG tablet Take 1 tablet (4 mg total) by mouth every 8 (eight) hours as needed for nausea or vomiting. 20 tablet 0  . ticagrelor (BRILINTA) 90 MG TABS tablet Take 1 tablet (90 mg total) by mouth 2 (two) times daily. 180 tablet 3   No current facility-administered medications on file prior to visit.     Allergies  Allergen Reactions  . Lisinopril Other (See Comments)    REACTION: cough with this and/or benazepril    Past Medical History:  Diagnosis Date  . Arthritis    knee  . Atrial fibrillation ([place]) post MI   . CAD (coronary artery disease)    a. 09/2003 Cath/PCI: LAD 80%->3.0x23 Cypher DES;  b. 03/2004 Cath ;  c. NSTEMI 12/2012 cath patent LAD stent, included distal right PDA supplying a small territory, 99% proximal OM 2 status post PCI and DES placement  d. cath 06/20/2016  anterolateral STEMI stent to LAD, PCTA to diag and LCx  . Colon polyps   . Diverticulosis   . Dysrhythmia   . Gout   . Hemorrhoids   . Hyperlipidemia   . Hypertension   . Internal hemorrhoids without mention of complication   . Myocardial infarction ([place])    LAST 12/16/12  . ST elevation (STEMI) myocardial infarction involving left anterior descending coronary artery ([place]) 06/20/2016    Past Surgical History:  Procedure Laterality Date  . CARDIAC CATHETERIZATION  2014   s/p stent  . CARDIAC CATHETERIZATION N/A 06/20/2016   Procedure: Left Heart Cath and Coronary Angiography;  Surgeon: Sherren Mocha, MD;  Location: [facility] CV LAB;  Service: Cardiovascular;  Laterality: N/A;  . CARDIAC CATHETERIZATION N/A 06/20/2016   Procedure: Coronary Stent Intervention;  Surgeon: Sherren Mocha, MD;  Location: [facility] CV LAB;  Service: Cardiovascular;  Laterality: N/A;  . CHOLECYSTECTOMY N/A 03/13/2016   Procedure: LAPAROSCOPIC CHOLECYSTECTOMY WITH INTRAOPERATIVE CHOLANGIOGRAM;  Surgeon: Christene Lye, MD;  Location: [facility] ORS;  Service: General;  Laterality: N/A;  . CHOLECYSTECTOMY, LAPAROSCOPIC  03/13/16   Infection - lengthy diagnosis d/t lack of gallstones  . COLONOSCOPY  10/2013  . CORONARY ANGIOPLASTY     STENTS  . Cypher Stent  10/04   mid LAD- gupta  .  LEFT HEART CATHETERIZATION WITH CORONARY ANGIOGRAM N/A 12/17/2012   Procedure: LEFT HEART CATHETERIZATION WITH CORONARY ANGIOGRAM;  Surgeon: Peter M Martinique, MD;  Location: [facility] CATH LAB;  Service: Cardiovascular;  Laterality: N/A;  . PERCUTANEOUS CORONARY STENT INTERVENTION (PCI-S) Right 12/17/2012   Procedure: PERCUTANEOUS CORONARY STENT INTERVENTION (PCI-S);  Surgeon: Peter M Martinique, MD;  Location: [facility] CATH LAB;  Service: Cardiovascular;  Laterality: Right;    Family History  Problem Relation Age of Onset  . Diabetes Father   . Coronary artery disease Father     CABG - 5 vessel 15 yr ago  . Heart attack Paternal Uncle     Age  23  . Heart attack Maternal Uncle     Age 61  . Coronary artery disease Maternal Grandfather   . Heart attack Maternal Grandfather     Died in 62s  . Throat cancer Maternal Grandfather   . Hypertension Mother   . Alzheimer's disease Maternal Grandmother   . Hypertension Brother   . Hypertension Brother   . Arrhythmia Brother   . Colon cancer Neg Hx   . Esophageal cancer Neg Hx   . Rectal cancer Neg Hx   . Stomach cancer Neg Hx     Social History   Social History  . Marital status: Married    Spouse name: N/A  . Number of children: 2  . Years of education: N/A   Occupational History  .     [employer] [place] History Main Topics  . Smoking status: Never Smoker  . Smokeless tobacco: Never Used  . Alcohol use No  . Drug use: No  . Sexual activity: Not on file   Other Topics Concern  . Not on file   Social History Narrative   Retired as Social research officer, government for [employer]            Review of Systems  Constitutional: Positive for fatigue.       Has lost some weight since MI--being more careful with eating. Working with nutritionist at cardiac rehab Wears seat belt  HENT: Positive for hearing loss and tinnitus. Negative for dental problem.        Rarely goes to dentist  Eyes: Negative for visual disturbance.       No diplopia or unilateral vision loss  Respiratory: Negative for cough, chest tightness and shortness of breath.   Cardiovascular: Negative for chest pain, palpitations and leg swelling.  Gastrointestinal: Negative for abdominal pain, blood in stool, constipation and nausea.       No heartburn  Endocrine: Negative for polydipsia and polyuria.  Genitourinary: Negative for difficulty urinating and urgency.       No sexual problems  Musculoskeletal: Positive for arthralgias. Negative for back pain and joint swelling.  Skin: Negative for rash.       No rash or suspicious skin lesions  Allergic/Immunologic: Negative for  environmental allergies and immunocompromised state.  Neurological: Positive for dizziness. Negative for syncope, weakness, light-headedness and headaches.  Hematological: Negative for adenopathy. Does not bruise/bleed easily.  Psychiatric/Behavioral: Negative for dysphoric mood. The patient is not nervous/anxious.        Sleeps slowly improving since leaving hospital. Tried melatonin--didn't help       Objective:   Physical Exam  Constitutional: He is oriented to person, place, and time. He appears well-developed and well-nourished. No distress.  HENT:  Head: Normocephalic and atraumatic.  Right Ear: External ear normal.  Left Ear: External ear normal.  Mouth/Throat: Oropharynx is clear and moist. No oropharyngeal exudate.  Eyes: Conjunctivae are normal. Pupils are equal, round, and reactive to light.  Neck: Normal range of motion. Neck supple. No thyromegaly present.  Cardiovascular: Normal rate, regular rhythm, normal heart sounds and intact distal pulses.  Exam reveals no gallop.   No murmur heard. Pulmonary/Chest: Effort normal and breath sounds normal. No respiratory distress. He has no wheezes. He has no rales.  Abdominal: Soft. There is no tenderness.  Musculoskeletal: He exhibits no edema or tenderness.  Lymphadenopathy:    He has no cervical adenopathy.  Neurological: He is alert and oriented to person, place, and time.  Skin: No rash noted. No erythema.  Psychiatric: He has a normal mood and affect. His behavior is normal.          Assessment & Plan:

## 2016-07-31 DIAGNOSIS — Z955 Presence of coronary angioplasty implant and graft: Secondary | ICD-10-CM

## 2016-07-31 NOTE — Progress Notes (Signed)
Daily Session Note  Patient Details  Name: Matthew Doyle MRN: [id number] Date of Birth: [birth year] Referring Provider:   Flowsheet Row Cardiac Rehab from 07/03/2016 in [facility] Cardiac and Pulmonary Rehab  Referring Provider  Arida      Encounter Date: 07/31/2016  Check In:     Session Check In - 07/31/16 1715      Check-In   Location [facility]-Cardiac & Pulmonary Rehab   Staff Present Jeanell Sparrow, DPT, CEEA;Carroll Enterkin, RN, Vickki Hearing, BA, ACSM CEP, Exercise Physiologist   Supervising physician immediately available to respond to emergencies See telemetry face sheet for immediately available ER MD   Medication changes reported     No   Fall or balance concerns reported    No   Warm-up and Cool-down Performed on first and last piece of equipment   Resistance Training Performed No   VAD Patient? No     Pain Assessment   Currently in Pain? No/denies   Multiple Pain Sites No         Goals Met:  Independence with exercise equipment  Goals Unmet:  Not Applicable  Comments: Patient completed exercise prescription and all exercise goals during rehab session. The exercise was tolerated well and the patient is progressing in the program.   Dr. Emily Filbert is Medical Director for [place] and [facility].

## 2016-08-06 ENCOUNTER — Encounter: Payer: BC Managed Care – PPO | Attending: Cardiovascular Disease | Admitting: *Deleted

## 2016-08-06 DIAGNOSIS — Z955 Presence of coronary angioplasty implant and graft: Secondary | ICD-10-CM | POA: Insufficient documentation

## 2016-08-06 DIAGNOSIS — Z7982 Long term (current) use of aspirin: Secondary | ICD-10-CM | POA: Insufficient documentation

## 2016-08-06 DIAGNOSIS — I2510 Atherosclerotic heart disease of native coronary artery without angina pectoris: Secondary | ICD-10-CM | POA: Insufficient documentation

## 2016-08-06 DIAGNOSIS — I252 Old myocardial infarction: Secondary | ICD-10-CM | POA: Diagnosis not present

## 2016-08-06 NOTE — Progress Notes (Signed)
Daily Session Note  Patient Details  Name: Matthew Doyle MRN: [id number] Date of Birth: 01-27-1956 Referring Provider:   Flowsheet Row Cardiac Rehab from 07/03/2016 in [facility] Cardiac and Pulmonary Rehab  Referring Provider  Arida      Encounter Date: 08/06/2016  Check In:     Session Check In - 08/06/16 1819      Check-In   Location [facility]-Cardiac & Pulmonary Rehab   Staff Present Heath Lark, RN, BSN, CCRP;Carroll Enterkin, RN, Vickki Hearing, BA, ACSM CEP, Exercise Physiologist   Supervising physician immediately available to respond to emergencies See telemetry face sheet for immediately available ER MD   Medication changes reported     No   Fall or balance concerns reported    No   Warm-up and Cool-down Performed on first and last piece of equipment   Resistance Training Performed Yes   VAD Patient? No     Pain Assessment   Currently in Pain? No/denies         Goals Met:  Exercise tolerated well No report of cardiac concerns or symptoms Strength training completed today  Goals Unmet:  Not Applicable  Comments: Doing well with exercise prescription progression.    Dr. Emily Filbert is Medical Director for [place] and [facility].

## 2016-08-07 DIAGNOSIS — Z955 Presence of coronary angioplasty implant and graft: Secondary | ICD-10-CM

## 2016-08-07 NOTE — Progress Notes (Signed)
Daily Session Note  Patient Details  Name: Matthew Doyle MRN: [id number] Date of Birth: December 21, 1955 Referring Provider:   Flowsheet Row Cardiac Rehab from 07/03/2016 in [facility] Cardiac and Pulmonary Rehab  Referring Provider  Arida      Encounter Date: 08/07/2016  Check In:     Session Check In - 08/07/16 1716      Check-In   Location [facility]-Cardiac & Pulmonary Rehab   Staff Present Alberteen Sam, MA, ACSM RCEP, Exercise Physiologist;Amanda Oletta Darter, BA, ACSM CEP, Exercise Physiologist;Carroll Enterkin, RN, BSN   Supervising physician immediately available to respond to emergencies See telemetry face sheet for immediately available ER MD   Medication changes reported     No   Fall or balance concerns reported    No   Warm-up and Cool-down Performed on first and last piece of equipment   Resistance Training Performed Yes   VAD Patient? No     Pain Assessment   Currently in Pain? No/denies           Exercise Prescription Changes - 08/07/16 1100      Exercise Review   Progression Yes     Response to Exercise   Blood Pressure (Admit) 138/84   Blood Pressure (Exercise) 164/86   Blood Pressure (Exit) 138/84   Heart Rate (Admit) 58 bpm   Heart Rate (Exercise) 124 bpm   Heart Rate (Exit) 72 bpm   Rating of Perceived Exertion (Exercise) 12   Symptoms none   Duration Progress to 45 minutes of aerobic exercise without signs/symptoms of physical distress   Intensity THRR unchanged     Progression   Progression Continue to progress workloads to maintain intensity without signs/symptoms of physical distress.   Average METs 4.97     Resistance Training   Training Prescription Yes   Weight 4     Interval Training   Interval Training No     Treadmill   MPH 3   Grade 2.5   Minutes 15   METs 4.54     NuStep   Level 7   METs 4.54      Goals Met:  Independence with exercise equipment Exercise tolerated well No report of cardiac concerns or symptoms Strength training  completed today  Goals Unmet:  Not Applicable  Comments: Pt able to follow exercise prescription today without complaint.  Will continue to monitor for progression.    Dr. Emily Filbert is Medical Director for [place] and [facility].

## 2016-08-08 ENCOUNTER — Encounter: Payer: Self-pay | Admitting: Cardiovascular Disease

## 2016-08-11 ENCOUNTER — Encounter: Payer: BC Managed Care – PPO | Admitting: *Deleted

## 2016-08-11 DIAGNOSIS — Z955 Presence of coronary angioplasty implant and graft: Secondary | ICD-10-CM | POA: Diagnosis not present

## 2016-08-11 NOTE — Progress Notes (Signed)
Daily Session Note  Patient Details  Name: Matthew Doyle MRN: [id number] Date of Birth: [birth year] Referring Provider:   Flowsheet Row Cardiac Rehab from 07/03/2016 in [facility] Cardiac and Pulmonary Rehab  Referring Provider  Arida      Encounter Date: 08/11/2016  Check In:     Session Check In - 08/11/16 1643      Check-In   Location [facility]-Cardiac & Pulmonary Rehab   Staff Present Gerlene Burdock, RN, BSN;Susanne Bice, RN, BSN, Laveda Norman, BS, ACSM CEP, Exercise Physiologist   Supervising physician immediately available to respond to emergencies See telemetry face sheet for immediately available ER MD   Medication changes reported     No   Fall or balance concerns reported    No   Warm-up and Cool-down Performed on first and last piece of equipment   Resistance Training Performed Yes   VAD Patient? No     Pain Assessment   Currently in Pain? No/denies   Multiple Pain Sites No         Goals Met:  Independence with exercise equipment Exercise tolerated well No report of cardiac concerns or symptoms Strength training completed today  Goals Unmet:  Not Applicable  Comments: Pt able to follow exercise prescription today without complaint.  Will continue to monitor for progression.    Dr. Emily Filbert is Medical Director for [place] and [facility].

## 2016-08-13 ENCOUNTER — Encounter: Payer: Self-pay | Admitting: *Deleted

## 2016-08-13 ENCOUNTER — Encounter: Payer: BC Managed Care – PPO | Admitting: *Deleted

## 2016-08-13 DIAGNOSIS — Z955 Presence of coronary angioplasty implant and graft: Secondary | ICD-10-CM

## 2016-08-13 NOTE — Progress Notes (Signed)
Daily Session Note  Patient Details  Name: CHUCKY HOMES MRN: [id number] Date of Birth: 1955-12-10 Referring Provider:   Flowsheet Row Cardiac Rehab from 07/03/2016 in [facility] Cardiac and Pulmonary Rehab  Referring Provider  Arida      Encounter Date: 08/13/2016  Check In:     Session Check In - 08/13/16 1708      Check-In   Location [facility]-Cardiac & Pulmonary Rehab   Staff Present Nyoka Cowden, RN, BSN, MA;Excell Neyland, RN, Vickki Hearing, BA, ACSM CEP, Exercise Physiologist   Supervising physician immediately available to respond to emergencies See telemetry face sheet for immediately available ER MD   Medication changes reported     No   Fall or balance concerns reported    No   Warm-up and Cool-down Performed on first and last piece of equipment   Resistance Training Performed Yes   VAD Patient? No     Pain Assessment   Currently in Pain? No/denies         Goals Met:  Proper associated with RPD/PD & O2 Sat No report of cardiac concerns or symptoms  Goals Unmet:  Not Applicable  Comments:     Dr. Emily Filbert is Medical Director for [place] and [facility].

## 2016-08-13 NOTE — Progress Notes (Unsigned)
Patient came to office to check response.    Patient needs a letter stating he can return to work no restrictions on 08/21/16 and be able to complete rehab with last visit being 9/28.  Patient still has plenty of sick time and would be ok with staying out of work until 9/29 when rehab is completed if this is advised by Dr. Fletcher Anon.     Please let patient know when letter is ready for pick up.

## 2016-08-13 NOTE — Progress Notes (Signed)
Cardiac Individual Treatment Plan  Patient Details  Name: Matthew Doyle MRN: [id number] Date of Birth: 08-15-1956 Referring Provider:   Flowsheet Row Cardiac Rehab from 07/03/2016 in [facility] Cardiac and Pulmonary Rehab  Referring Provider  Arida      Initial Encounter Date:  Flowsheet Row Cardiac Rehab from 07/03/2016 in [facility] Cardiac and Pulmonary Rehab  Date  07/03/16  Referring Provider  Fletcher Anon      Visit Diagnosis: S/P coronary artery stent placement  Patient's Home Medications on Admission:  Current Outpatient Prescriptions:  .  aspirin 81 MG tablet, Take 1 tablet (81 mg total) by mouth daily., Disp: 30 tablet, Rfl:  .  atorvastatin (LIPITOR) 80 MG tablet, Take 1 tablet (80 mg total) by mouth daily at 6 PM., Disp: 90 tablet, Rfl: 3 .  ezetimibe (ZETIA) 10 MG tablet, Take 1 tablet (10 mg total) by mouth daily., Disp: 90 tablet, Rfl: 3 .  losartan (COZAAR) 100 MG tablet, TAKE ONE TABLET BY MOUTH ONCE DAILY, Disp: 90 tablet, Rfl: 3 .  nitroGLYCERIN (NITROSTAT) 0.4 MG SL tablet, Place 1 tablet (0.4 mg total) under the tongue every 5 (five) minutes as needed for chest pain., Disp: 25 tablet, Rfl: 3 .  ondansetron (ZOFRAN) 4 MG tablet, Take 1 tablet (4 mg total) by mouth every 8 (eight) hours as needed for nausea or vomiting., Disp: 20 tablet, Rfl: 0 .  ticagrelor (BRILINTA) 90 MG TABS tablet, Take 1 tablet (90 mg total) by mouth 2 (two) times daily., Disp: 180 tablet, Rfl: 3  Past Medical History: Past Medical History:  Diagnosis Date  . Arthritis    knee  . Atrial fibrillation ([place]) post MI   . CAD (coronary artery disease)    a. 09/2003 Cath/PCI: LAD 80%->3.0x23 Cypher DES;  b. 03/2004 Cath ;  c. NSTEMI 12/2012 cath patent LAD stent, included distal right PDA supplying a small territory, 99% proximal OM 2 status post PCI and DES placement  d. cath 06/20/2016 anterolateral STEMI stent to LAD, PCTA to diag and LCx  . Colon polyps   . Diverticulosis   . Dysrhythmia   . Gout   .  Hemorrhoids   . Hyperlipidemia   . Hypertension   . Internal hemorrhoids without mention of complication   . Myocardial infarction ([place])    LAST 12/16/12  . ST elevation (STEMI) myocardial infarction involving left anterior descending coronary artery ([place]) 06/20/2016    Tobacco Use: History  Smoking Status  . Never Smoker  Smokeless Tobacco  . Never Used    Labs: Recent Review Flowsheet Data    Labs for ITP Cardiac and Pulmonary Rehab Latest Ref Rng & Units 05/26/2014 05/28/2015 03/09/2016 06/20/2016 06/20/2016   Cholestrol 0 - 200 mg/dL 119 118 - 120 -   LDLCALC 0 - 99 mg/dL 72 74 - 70 -   HDL >40 mg/dL 35.50(L) 29.90(L) - 32(L) -   Trlycerides <150 mg/dL 57.0 69.0 - 88 -   Hemoglobin A1c 4.8 - 5.6 % 6.4 - 5.7 6.3(H) 6.3(H)   TCO2 0 - 100 mmol/L - - - 18 -       Exercise Target Goals:    Exercise Program Goal: Individual exercise prescription set with THRR, safety & activity barriers. Participant demonstrates ability to understand and report RPE using BORG scale, to self-measure pulse accurately, and to acknowledge the importance of the exercise prescription.  Exercise Prescription Goal: Starting with aerobic activity 30 plus minutes a day, 3 days per week for initial exercise prescription. Provide  home exercise prescription and guidelines that participant acknowledges understanding prior to discharge.  Activity Barriers & Risk Stratification:     Activity Barriers & Cardiac Risk Stratification - 07/03/16 1346      Activity Barriers & Cardiac Risk Stratification   Activity Barriers Arthritis;Deconditioning;Joint Problems   Comments left shoulder fell off a bus years ago and landed on his shoulder so Matthew Doyle can't lift it up over his head. Matthew Doyle reports  "both of my knees are bad".    Cardiac Risk Stratification High      6 Minute Walk:     6 Minute Walk    Row Name 07/03/16 1317         6 Minute Walk   Distance 1390 feet     Walk Time 6 minutes     MPH 2.63     METS  3.2     RPE 12     VO2 Peak 11.25     Resting HR 67 bpm     Resting BP 130/86     Max Ex. HR 84 bpm     Max Ex. BP 144/92        Initial Exercise Prescription:     Initial Exercise Prescription - 07/03/16 1300      Date of Initial Exercise RX and Referring Provider   Date 07/03/16   Referring Provider Arida     Treadmill   MPH 2.6   Grade 0.5   Minutes 15   METs 3.17     Bike   Level 1.3   Minutes 15     Recumbant Bike   Level 2   RPM 60   Minutes 15   METs 3.14     NuStep   Level 2   Watts 50   METs 2.6     REL-XR   Level 2   Minutes 15   METs 3     T5 Nustep   Level 2   Minutes 15   METs 2.9     Prescription Details   Frequency (times per week) 3   Duration Progress to 45 minutes of aerobic exercise without signs/symptoms of physical distress     Intensity   THRR 40-80% of Max Heartrate 104-141   Ratings of Perceived Exertion 11-15     Progression   Progression Continue to progress workloads to maintain intensity without signs/symptoms of physical distress.     Resistance Training   Training Prescription Yes   Weight 3      Perform Capillary Blood Glucose checks as needed.  Exercise Prescription Changes:     Exercise Prescription Changes    Row Name 07/10/16 1400 07/25/16 1000 08/07/16 1100         Exercise Review   Progression Yes Yes Yes       Response to Exercise   Blood Pressure (Admit) 120/80 118/62 138/84     Blood Pressure (Exercise) 168/84 160/72 164/86     Blood Pressure (Exit) 122/70 104/72 138/84     Heart Rate (Admit) 80 bpm 80 bpm 58 bpm     Heart Rate (Exercise) 124 bpm 126 bpm 124 bpm     Heart Rate (Exit) 77 bpm 81 bpm 72 bpm     Rating of Perceived Exertion (Exercise) '10 13 12     '$ Symptoms no none none     Duration Progress to 45 minutes of aerobic exercise without signs/symptoms of physical distress Progress to 45 minutes of aerobic exercise without signs/symptoms of physical  distress Progress to 45 minutes  of aerobic exercise without signs/symptoms of physical distress     Intensity THRR unchanged THRR unchanged THRR unchanged       Progression   Progression Continue to progress workloads to maintain intensity without signs/symptoms of physical distress. Continue to progress workloads to maintain intensity without signs/symptoms of physical distress. Continue to progress workloads to maintain intensity without signs/symptoms of physical distress.     Average METs 3.26 4.25 4.97       Resistance Training   Training Prescription Yes Yes Yes     Weight '3 4 4       '$ Interval Training   Interval Training  - No No       Oxygen   Oxygen  - Intermittent  -       Treadmill   MPH '3 3 3     '$ Grade 0.5 2.5 2.5     Minutes '15 15 15     '$ METs 3.5 4.54 4.54       NuStep   Level '4 7 7     '$ METs 2.8 4.5 4.54       Biostep-RELP   Level  - 7  -     Minutes  - 15  -     METs  - 4  -       Home Exercise Plan   Plans to continue exercise at  [facility] (comment)  [facility] [facility] [place] 2 additional days to program exercise sessions.  -        Exercise Comments:     Exercise Comments    Row Name 07/07/16 1753 07/10/16 1440 07/25/16 1018 08/07/16 1152     Exercise Comments First full day of exercise!  Patient was oriented to gym and equipment including functions, settings, policies, and procedures.  Patient's individual exercise prescription and treatment plan were reviewed.  All starting workloads were established based on the results of the 6 minute walk test done at initial orientation visit.  The plan for exercise progression was also introduced and progression will be customized based on patient's performance and goals Matthew Doyle is progressing well with execise in his first week. Matthew Doyle has done well with exercise and plans to [facility]. Matthew Doyle continues to progress well with exercise.       Discharge Exercise Prescription (Final Exercise Prescription  Changes):     Exercise Prescription Changes - 08/07/16 1100      Exercise Review   Progression Yes     Response to Exercise   Blood Pressure (Admit) 138/84   Blood Pressure (Exercise) 164/86   Blood Pressure (Exit) 138/84   Heart Rate (Admit) 58 bpm   Heart Rate (Exercise) 124 bpm   Heart Rate (Exit) 72 bpm   Rating of Perceived Exertion (Exercise) 12   Symptoms none   Duration Progress to 45 minutes of aerobic exercise without signs/symptoms of physical distress   Intensity THRR unchanged     Progression   Progression Continue to progress workloads to maintain intensity without signs/symptoms of physical distress.   Average METs 4.97     Resistance Training   Training Prescription Yes   Weight 4     Interval Training   Interval Training No     Treadmill   MPH 3   Grade 2.5   Minutes 15   METs 4.54     NuStep  Level 7   METs 4.54      Nutrition:  Target Goals: Understanding of nutrition guidelines, daily intake of sodium '1500mg'$ , cholesterol '200mg'$ , calories 30% from fat and 7% or less from saturated fats, daily to have 5 or more servings of fruits and vegetables.  Biometrics:     Pre Biometrics - 07/03/16 1316      Pre Biometrics   Height 5' 11.5" (1.816 m)   Weight 242 lb 1.6 oz (109.8 kg)   Waist Circumference 45.5 inches   Hip Circumference 47.5 inches   Waist to Hip Ratio 0.96 %   BMI (Calculated) 33.4   Single Leg Stand 30 seconds       Nutrition Therapy Plan and Nutrition Goals:     Nutrition Therapy & Goals - 07/14/16 1208      Nutrition Therapy   Diet Instructed patient's wife on heart healthy dietary guidelines based on 1900 calorie meal plan.  Patient's wife came for the appointment. She does all of the cooking.   Drug/Food Interactions Statins/Doyle Fruits   Protein (specify units) 8   Fiber 30 grams   Whole Grain Foods 3 servings   Saturated Fats 13 max. grams   Fruits and Vegetables 5 servings/day  5-8   Sodium 2000 grams   1500 mg ideal     Personal Nutrition Goals   Personal Goal #1 Encouraged that both she and patient read labels for saturated fat, trans fat and sodium.   Personal Goal #2 Suggested that a fruit or vegetable or both be included in all meals.   Personal Goal #3 Encouraged to measure some portions, especially of starchy foods.   Comments Patient's wife had understood that her husband did not need to come for the appointment. Did not want to turn her away as she had many questions regarding her husband'sdiet. Told her that I would be glad to schedule with her husband if he desires.     Intervention Plan   Intervention Prescribe, educate and counsel regarding individualized specific dietary modifications aiming towards targeted core components such as weight, hypertension, lipid management, diabetes, heart failure and other comorbidities.;Nutrition handout(s) given to patient.  Patient's wife was instructed.      Nutrition Discharge: Rate Your Plate Scores:     Nutrition Assessments - 07/11/16 0942      Rate Your Plate Scores   Pre Score 57   Pre Score % 63 %      Nutrition Goals Re-Evaluation:   Psychosocial: Target Goals: Acknowledge presence or absence of depression, maximize coping skills, provide positive support system. Participant is able to verbalize types and ability to use techniques and skills needed for reducing stress and depression.  Initial Review & Psychosocial Screening:     Initial Psych Review & Screening - 07/03/16 [place]? Yes   Comments Matthew Doyle's left shoulder fell off a bus years ago and landed on his shoulder so Matthew Doyle can't lift it up over his head. Matthew Doyle reports  "both of my knees are bad". Matthew Doyle reports he has 6 months of PTO time so is getting paid for being off work. He also said he does not have a copay for Cardiac Rehab since he has met his $5000 deducttible. 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      Screening Interventions   Interventions Encouraged to exercise      Quality of Life Scores:   PHQ-9: Recent Review Flowsheet Data    Depression screen [facility] 2/9 07/03/2016   Decreased Interest 0   Down, Depressed, Hopeless 0   PHQ - 2 Score 0   Altered sleeping 3   Tired, decreased energy 3   Change in appetite 3   Feeling bad or failure about yourself  1   Trouble concentrating 1   Moving slowly or fidgety/restless 0   Suicidal thoughts 0   PHQ-9 Score 11   Difficult doing work/chores Somewhat difficult      Psychosocial Evaluation and Intervention:     Psychosocial Evaluation - 07/07/16 1741      Psychosocial Evaluation & Interventions   Comments Counselor met with Matthew Doyle today for initial psychosocial evaluation.  He is a 60 year old who had a heart attack with a stent inserted on 7/21.  This was his third cardiac episode with a stent inserted in 2004 and another in 2014.  Matthew Doyle also has arthritis that he contends with as well.  He has a strong support system with a spouse of 49 years and (2) adult sons who live close by.  Matthew Doyle states he does not sleep well with intermittent waking and having difficulty going back to sleep.  He says his appetite is better but still not fully back to normal.  Matthew Doyle denies a history of depression or anxiety or any current symptoms.  He states his mood is generally positive and he has minimal stress in his life other than a disabled spouse and finances currently.  Matthew Doyle has goals to get back his stamina and strength while in this program and hopefully start sleeping better.  He plans to join the [facility] and work out with a buddy following this program to maintain consistency in exercise.  Counselor encouraged Matthew Doyle to speak with his Dr.  or Pharmacist re: a possible natural OTC sleep aid that is sustained release in order to help with his sleep problems currently.  Counselor will follow with Matthew Doyle throughout the course of this program.        Psychosocial Re-Evaluation:   Vocational Rehabilitation: Provide vocational rehab assistance to qualifying candidates.   Vocational Rehab Evaluation & Intervention:   Education: Education Goals: Education classes will be provided on a weekly basis, covering required topics. Participant will state understanding/return demonstration of topics presented.  Learning Barriers/Preferences:   Education Topics: General Nutrition Guidelines/Fats and Fiber: -Group instruction provided by verbal, written material, models and posters to present the general guidelines for heart healthy nutrition. Gives an explanation and review of dietary fats and fiber.   Controlling Sodium/Reading Food Labels: -Group verbal and written material supporting the discussion of sodium use in heart healthy nutrition. Review and explanation with models, verbal and written materials for utilization of the food label.   Exercise Physiology & Risk Factors: - Group verbal and written instruction with models to review the exercise physiology of the cardiovascular system and associated critical values. Details cardiovascular disease risk factors and the goals associated with each risk factor. Flowsheet Row Cardiac Rehab from 08/11/2016 in [facility] Cardiac and Pulmonary Rehab  Date  07/07/16  Educator  [facility]  Instruction Review Code  2- meets goals/outcomes      Aerobic Exercise & Resistance Training: - Gives group  verbal and written discussion on the health impact of inactivity. On the components of aerobic and resistive training programs and the benefits of this training and how to safely progress through these programs. Flowsheet Row Cardiac Rehab from 08/11/2016 in [facility] Cardiac and Pulmonary Rehab  Date  07/09/16   Educator  A. Sommer  Instruction Review Code  2- meets goals/outcomes      Flexibility, Balance, General Exercise Guidelines: - Provides group verbal and written instruction on the benefits of flexibility and balance training programs. Provides general exercise guidelines with specific guidelines to those with heart or lung disease. Demonstration and skill practice provided. Flowsheet Row Cardiac Rehab from 08/11/2016 in [facility] Cardiac and Pulmonary Rehab  Date  07/14/16  Educator  AS  Instruction Review Code  2- meets goals/outcomes      Stress Management: - Provides group verbal and written instruction about the health risks of elevated stress, cause of high stress, and healthy ways to reduce stress. Flowsheet Row Cardiac Rehab from 08/11/2016 in [facility] Cardiac and Pulmonary Rehab  Date  07/16/16  Azalia Bilis, MSW  Instruction Review Code  2- meets goals/outcomes      Depression: - Provides group verbal and written instruction on the correlation between heart/lung disease and depressed mood, treatment options, and the stigmas associated with seeking treatment.   Anatomy & Physiology of the Heart: - Group verbal and written instruction and models provide basic cardiac anatomy and physiology, with the coronary electrical and arterial systems. Review of: AMI, Angina, Valve disease, Heart Failure, Cardiac Arrhythmia, Pacemakers, and the ICD. Flowsheet Row Cardiac Rehab from 08/11/2016 in [facility] Cardiac and Pulmonary Rehab  Date  07/21/16  Educator  CE  Instruction Review Code  2- meets goals/outcomes      Cardiac Procedures: - Group verbal and written instruction and models to describe the testing methods done to diagnose heart disease. Reviews the outcomes of the test results. Describes the treatment choices: Medical Management, Angioplasty, or Coronary Bypass Surgery. Flowsheet Row Cardiac Rehab from 08/11/2016 in [facility] Cardiac and Pulmonary Rehab  Date  07/28/16  Educator   [place]  Instruction Review Code  2- meets goals/outcomes      Cardiac Medications: - Group verbal and written instruction to review commonly prescribed medications for heart disease. Reviews the medication, class of the drug, and side effects. Includes the steps to properly store meds and maintain the prescription regimen. Flowsheet Row Cardiac Rehab from 08/11/2016 in [facility] Cardiac and Pulmonary Rehab  Date  08/06/16  Educator  SB  Instruction Review Code  2- meets goals/outcomes      Go Sex-Intimacy & Heart Disease, Get SMART - Goal Setting: - Group verbal and written instruction through game format to discuss heart disease and the return to sexual intimacy. Provides group verbal and written material to discuss and apply goal setting through the application of the S.M.A.R.T. Method. Flowsheet Row Cardiac Rehab from 08/11/2016 in [facility] Cardiac and Pulmonary Rehab  Date  07/28/16  Educator  [place]  Instruction Review Code  2- meets goals/outcomes      Other Matters of the Heart: - Provides group verbal, written materials and models to describe Heart Failure, Angina, Valve Disease, and Diabetes in the realm of heart disease. Includes description of the disease process and treatment options available to the cardiac patient. Flowsheet Row Cardiac Rehab from 08/11/2016 in [facility] Cardiac and Pulmonary Rehab  Date  07/21/16  Educator  CE  Instruction Review Code  2- meets goals/outcomes  Exercise & Equipment Safety: - Individual verbal instruction and demonstration of equipment use and safety with use of the equipment. Flowsheet Row Cardiac Rehab from 08/11/2016 in [facility] Cardiac and Pulmonary Rehab  Date  07/03/16  Educator  C. Enterkin,RN  Instruction Review Code  1- partially meets, needs review/practice      Infection Prevention: - Provides verbal and written material to individual with discussion of infection control including proper hand washing and proper equipment cleaning during  exercise session. Flowsheet Row Cardiac Rehab from 08/11/2016 in [facility] Cardiac and Pulmonary Rehab  Date  07/03/16  Educator  C. EnterkinRN  Instruction Review Code  2- meets goals/outcomes      Falls Prevention: - Provides verbal and written material to individual with discussion of falls prevention and safety. Flowsheet Row Cardiac Rehab from 08/11/2016 in [facility] Cardiac and Pulmonary Rehab  Date  07/03/16  Educator  C. Enterkin,RN  Instruction Review Code  2- meets goals/outcomes      Diabetes: - Individual verbal and written instruction to review signs/symptoms of diabetes, desired ranges of glucose level fasting, after meals and with exercise. Advice that pre and post exercise glucose checks will be done for 3 sessions at entry of program.    Knowledge Questionnaire Score:   Core Components/Risk Factors/Patient Goals at Admission:     Personal Goals and Risk Factors at Admission - 07/03/16 1352      Core Components/Risk Factors/Patient Goals on Admission    Weight Management Yes   Intervention Weight Management: Provide education and appropriate resources to help participant work on and attain dietary goals.   Admit Weight 242 lb (109.8 kg)   Goal Weight: Short Term 232 lb (105.2 kg)   Goal Weight: Long Term 205 lb (93 kg)   Expected Outcomes Short Term: Continue to assess and modify interventions until short term weight is achieved;Weight Maintenance: Understanding of the daily nutrition guidelines, which includes 25-35% calories from fat, 7% or less cal from saturated fats, less than '200mg'$  cholesterol, less than 1.5gm of sodium, & 5 or more servings of fruits and vegetables daily;Weight Loss: Understanding of general recommendations for a balanced deficit meal plan, which promotes 1-2 lb weight loss per week and includes a negative energy balance of [phone] kcal/d;Understanding recommendations for meals to include 15-35% energy as protein, 25-35% energy from fat, 35-60% energy  from carbohydrates, less than '200mg'$  of dietary cholesterol, 20-35 gm of total fiber daily;Understanding of distribution of calorie intake throughout the day with the consumption of 4-5 meals/snacks;Weight Gain: Understanding of general recommendations for a high calorie, high protein meal plan that promotes weight gain by distributing calorie intake throughout the day with the consumption for 4-5 meals, snacks, and/or supplements   Increase Strength and Stamina Yes   Intervention Provide advice, education, support and counseling about physical activity/exercise needs.;Develop an individualized exercise prescription for aerobic and resistive training based on initial evaluation findings, risk stratification, comorbidities and participant's personal goals.   Expected Outcomes Achievement of increased cardiorespiratory fitness and enhanced flexibility, muscular endurance and strength shown through measurements of functional capacity and personal statement of participant.   Lipids Yes   Intervention Provide education and support for participant on nutrition & aerobic/resistive exercise along with prescribed medications to achieve LDL '70mg'$ , HDL >'40mg'$ .   Expected Outcomes Short Term: Participant states understanding of desired cholesterol values and is compliant with medications prescribed. Participant is following exercise prescription and nutrition guidelines.;Long Term: Cholesterol controlled with medications as prescribed, with individualized exercise RX and with personalized  nutrition plan. Value goals: LDL < '70mg'$ , HDL > 40 mg.      Core Components/Risk Factors/Patient Goals Review:      Goals and Risk Factor Review    Row Name 07/14/16 1805 07/24/16 1040           Core Components/Risk Factors/Patient Goals Review   Personal Goals Review Sedentary;Hypertension;Lipids Weight Management/Obesity;Stress;Hypertension      Review Matthew Doyle is in his second week of Heart Track - hewas tired after the first  week.  His BP measurements have been good but his cholesterol hasnt been measured recently.  His wife does the cooking and met with the RD to learn healthier techniques. Matthew Doyle's wife met with the RD and has changed what she cooks to the recommendations from the RD.  Matthew Doyle has lost 5 lb.  He is not working now so stress levels are low.      Expected Outcomes Matthew Doyle will continue to improve strength and stamina with regular exercise and see improved measures for BP and cholesterol. Matthew Doyle will continue to exercise and monitor his vitals.  He will see an overall improvement in health.         Core Components/Risk Factors/Patient Goals at Discharge (Final Review):      Goals and Risk Factor Review - 07/24/16 1040      Core Components/Risk Factors/Patient Goals Review   Personal Goals Review Weight Management/Obesity;Stress;Hypertension   Review Matthew Doyle's wife met with the RD and has changed what she cooks to the recommendations from the RD.  Matthew Doyle has lost 5 lb.  He is not working now so stress levels are low.   Expected Outcomes Matthew Doyle will continue to exercise and monitor his vitals.  He will see an overall improvement in health.      ITP Comments:     ITP Comments    Row Name 07/03/16 1348 07/03/16 1351 07/03/16 1354 07/16/16 0847 07/25/16 1021   ITP Comments Matthew Doyle's left shoulder fell off a bus years ago and landed on his shoulder so Matthew Doyle can't lift it up over his head. Matthew Doyle reports  "both of my knees are bad". Matthew Doyle reports he has 6 months of PTO time so is getting paid for being off work. He also said he does not have a copay for Cardiac Rehab since he has met his $5000 deducttible. 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  Matthew Doyle said his wife fixes all his meals. He usually takes left overs for lunch to work.  I  emphasized the importance of Matthew Doyle to carry his NTG sl. I showed him a metal pill container that I carry on my key ring. Matthew Doyle said he had a prescription for NTG after his first heart attack but was not carrying it when he had this last heart attack.  30 day review. Continue with ITP unless changes noted by Medical Director at signature of review. Matthew Doyle has done well with exercise and plans to [facility] the [facility].   Lane Name 08/07/16 1153 08/13/16 0714         ITP Comments Matthew Doyle continues to progress well with exercise. 30 day review. Continue with ITP unless changes noted by Medical Director at signature of review.         Comments:

## 2016-08-14 ENCOUNTER — Encounter: Payer: BC Managed Care – PPO | Admitting: *Deleted

## 2016-08-14 ENCOUNTER — Encounter: Payer: Self-pay | Admitting: Cardiovascular Disease

## 2016-08-14 DIAGNOSIS — Z955 Presence of coronary angioplasty implant and graft: Secondary | ICD-10-CM

## 2016-08-14 NOTE — Progress Notes (Signed)
Daily Session Note  Patient Details  Name: MELDRICK BUTTERY MRN: [id number] Date of Birth: 09/27/1956 Referring Provider:   Flowsheet Row Cardiac Rehab from 07/03/2016 in [facility] Cardiac and Pulmonary Rehab  Referring Provider  Arida      Encounter Date: 08/14/2016  Check In:     Session Check In - 08/14/16 1709      Check-In   Location [facility]-Cardiac & Pulmonary Rehab   Staff Present Nyoka Cowden, RN, BSN, MA;Letina Luckett, RN, Vickki Hearing, BA, ACSM CEP, Exercise Physiologist   Supervising physician immediately available to respond to emergencies See telemetry face sheet for immediately available ER MD   Medication changes reported     No   Fall or balance concerns reported    No   Warm-up and Cool-down Performed on first and last piece of equipment   Resistance Training Performed Yes   VAD Patient? No     Pain Assessment   Currently in Pain? No/denies         Goals Met:  Proper associated with RPD/PD & O2 Sat Exercise tolerated well  Goals Unmet:  Not Applicable  Comments:    Dr. Emily Filbert is Medical Director for [place] and [facility].

## 2016-08-18 ENCOUNTER — Encounter: Payer: BC Managed Care – PPO | Admitting: *Deleted

## 2016-08-18 DIAGNOSIS — Z955 Presence of coronary angioplasty implant and graft: Secondary | ICD-10-CM | POA: Diagnosis not present

## 2016-08-18 NOTE — Progress Notes (Signed)
Daily Session Note  Patient Details  Name: Matthew Doyle MRN: [id number] Date of Birth: [birth year] Referring Provider:   Flowsheet Row Cardiac Rehab from 07/03/2016 in [facility] Cardiac and Pulmonary Rehab  Referring Provider  Arida      Encounter Date: 08/18/2016  Check In:     Session Check In - 08/18/16 1630      Check-In   Location [facility]-Cardiac & Pulmonary Rehab   Staff Present Heath Lark, RN, BSN, CCRP;Carroll Enterkin, RN, Moises Blood, BS, ACSM CEP, Exercise Physiologist   Supervising physician immediately available to respond to emergencies See telemetry face sheet for immediately available ER MD   Medication changes reported     No   Fall or balance concerns reported    No   Warm-up and Cool-down Performed on first and last piece of equipment   Resistance Training Performed Yes   VAD Patient? No     Pain Assessment   Currently in Pain? No/denies   Multiple Pain Sites No         Goals Met:  Independence with exercise equipment Exercise tolerated well No report of cardiac concerns or symptoms Strength training completed today  Goals Unmet:  Not Applicable  Comments: Pt able to follow exercise prescription today without complaint.  Will continue to monitor for progression.    Dr. Emily Filbert is Medical Director for [place] and [facility].

## 2016-08-20 ENCOUNTER — Encounter: Payer: BC Managed Care – PPO | Admitting: *Deleted

## 2016-08-20 DIAGNOSIS — Z955 Presence of coronary angioplasty implant and graft: Secondary | ICD-10-CM | POA: Diagnosis not present

## 2016-08-20 NOTE — Progress Notes (Signed)
Daily Session Note  Patient Details  Name: Matthew Doyle MRN: [id number] Date of Birth: 05/13/1956 Referring Provider:   Flowsheet Row Cardiac Rehab from 07/03/2016 in [facility] Cardiac and Pulmonary Rehab  Referring Provider  Arida      Encounter Date: 08/20/2016  Check In:     Session Check In - 08/20/16 1706      Check-In   Location [facility]-Cardiac & Pulmonary Rehab   Staff Present Nyoka Cowden, RN, BSN, MA;Carroll Enterkin, RN, Vickki Hearing, BA, ACSM CEP, Exercise Physiologist   Supervising physician immediately available to respond to emergencies See telemetry face sheet for immediately available ER MD   Medication changes reported     No   Fall or balance concerns reported    No   Warm-up and Cool-down Performed on first and last piece of equipment   Resistance Training Performed Yes   VAD Patient? No     Pain Assessment   Currently in Pain? No/denies         Goals Met:  Independence with exercise equipment Exercise tolerated well No report of cardiac concerns or symptoms Strength training completed today  Goals Unmet:  Not Applicable  Comments: Pt able to follow exercise prescription today without complaint.  Will continue to monitor for progression.   Dr. Emily Filbert is Medical Director for [place] and [facility].

## 2016-08-21 ENCOUNTER — Encounter: Payer: BC Managed Care – PPO | Admitting: *Deleted

## 2016-08-21 DIAGNOSIS — Z955 Presence of coronary angioplasty implant and graft: Secondary | ICD-10-CM | POA: Diagnosis not present

## 2016-08-21 NOTE — Progress Notes (Signed)
Daily Session Note  Patient Details  Name: Matthew Doyle MRN: [id number] Date of Birth: [birth year] Referring Provider:   Flowsheet Row Cardiac Rehab from 07/03/2016 in [facility] Cardiac and Pulmonary Rehab  Referring Provider  Arida      Encounter Date: 08/21/2016  Check In:     Session Check In - 08/21/16 1822      Check-In   Location [facility]-Cardiac & Pulmonary Rehab   Staff Present Heath Lark, RN, BSN, CCRP;Amanda Sommer, BA, ACSM CEP, Exercise Physiologist  Jaclynn Guarneri RN   Supervising physician immediately available to respond to emergencies See telemetry face sheet for immediately available ER MD   Medication changes reported     No   Fall or balance concerns reported    No   Warm-up and Cool-down Performed on first and last piece of equipment   Resistance Training Performed Yes   VAD Patient? No     Pain Assessment   Currently in Pain? No/denies           Exercise Prescription Changes - 08/21/16 1100      Exercise Review   Progression Yes     Response to Exercise   Blood Pressure (Admit) 116/78   Blood Pressure (Exercise) 164/70   Blood Pressure (Exit) 110/70   Heart Rate (Admit) 64 bpm   Heart Rate (Exercise) 130 bpm   Heart Rate (Exit) 86 bpm   Rating of Perceived Exertion (Exercise) 13   Symptoms none   Duration Progress to 45 minutes of aerobic exercise without signs/symptoms of physical distress   Intensity THRR unchanged     Progression   Progression Continue to progress workloads to maintain intensity without signs/symptoms of physical distress.     Resistance Training   Training Prescription Yes   Weight 4     Interval Training   Interval Training No     Treadmill   MPH 3   Grade 2.5   Minutes 15   METs 4.54     Elliptical   Level 1   Minutes 15      Goals Met:  Exercise tolerated well No report of cardiac concerns or symptoms Strength training completed today  Goals Unmet:  Not Applicable  Comments: Doing well with  exercise prescription progression.    Dr. Emily Filbert is Medical Director for [place] and [facility].

## 2016-08-25 ENCOUNTER — Encounter: Payer: BC Managed Care – PPO | Admitting: *Deleted

## 2016-08-25 DIAGNOSIS — Z955 Presence of coronary angioplasty implant and graft: Secondary | ICD-10-CM

## 2016-08-25 NOTE — Progress Notes (Signed)
Daily Session Note  Patient Details  Name: Matthew Doyle MRN: [id number] Date of Birth: 03/26/1956 Referring Provider:   Flowsheet Row Cardiac Rehab from 07/03/2016 in [facility] Cardiac and Pulmonary Rehab  Referring Provider  Arida      Encounter Date: 08/25/2016  Check In:     Session Check In - 08/25/16 1634      Check-In   Staff Present Heath Lark, RN, BSN, CCRP;Carroll Enterkin, RN, Moises Blood, BS, ACSM CEP, Exercise Physiologist   Supervising physician immediately available to respond to emergencies See telemetry face sheet for immediately available ER MD   Medication changes reported     No   Fall or balance concerns reported    No   Resistance Training Performed Yes   VAD Patient? No     Pain Assessment   Currently in Pain? No/denies         Goals Met:  Independence with exercise equipment Exercise tolerated well Personal goals reviewed No report of cardiac concerns or symptoms Strength training completed today  Goals Unmet:  Not Applicable  Comments: Doing well with exercise prescription progression.    Dr. Emily Filbert is Medical Director for [place] and [facility].

## 2016-08-27 ENCOUNTER — Encounter: Payer: BC Managed Care – PPO | Admitting: *Deleted

## 2016-08-27 DIAGNOSIS — Z955 Presence of coronary angioplasty implant and graft: Secondary | ICD-10-CM | POA: Diagnosis not present

## 2016-08-27 NOTE — Progress Notes (Signed)
Cardiac Individual Treatment Plan  Patient Details  Name: Matthew Doyle: [id number] Date of Birth: 08-15-1956 Referring Provider:   Flowsheet Row Cardiac Rehab from 07/03/2016 in [facility] Cardiac and Pulmonary Rehab  Referring Provider  Arida      Initial Encounter Date:  Flowsheet Row Cardiac Rehab from 07/03/2016 in [facility] Cardiac and Pulmonary Rehab  Date  07/03/16  Referring Provider  Fletcher Anon      Visit Diagnosis: S/P coronary artery stent placement  Patient's Home Medications on Admission:  Current Outpatient Prescriptions:  .  aspirin 81 MG tablet, Take 1 tablet (81 mg total) by mouth daily., Disp: 30 tablet, Rfl:  .  atorvastatin (LIPITOR) 80 MG tablet, Take 1 tablet (80 mg total) by mouth daily at 6 PM., Disp: 90 tablet, Rfl: 3 .  ezetimibe (ZETIA) 10 MG tablet, Take 1 tablet (10 mg total) by mouth daily., Disp: 90 tablet, Rfl: 3 .  losartan (COZAAR) 100 MG tablet, TAKE ONE TABLET BY MOUTH ONCE DAILY, Disp: 90 tablet, Rfl: 3 .  nitroGLYCERIN (NITROSTAT) 0.4 MG SL tablet, Place 1 tablet (0.4 mg total) under the tongue every 5 (five) minutes as needed for chest pain., Disp: 25 tablet, Rfl: 3 .  ondansetron (ZOFRAN) 4 MG tablet, Take 1 tablet (4 mg total) by mouth every 8 (eight) hours as needed for nausea or vomiting., Disp: 20 tablet, Rfl: 0 .  ticagrelor (BRILINTA) 90 MG TABS tablet, Take 1 tablet (90 mg total) by mouth 2 (two) times daily., Disp: 180 tablet, Rfl: 3  Past Medical History: Past Medical History:  Diagnosis Date  . Arthritis    knee  . Atrial fibrillation ([place]) post MI   . CAD (coronary artery disease)    a. 09/2003 Cath/PCI: LAD 80%->3.0x23 Cypher DES;  b. 03/2004 Cath ;  c. NSTEMI 12/2012 cath patent LAD stent, included distal right PDA supplying a small territory, 99% proximal OM 2 status post PCI and DES placement  d. cath 06/20/2016 anterolateral STEMI stent to LAD, PCTA to diag and LCx  . Colon polyps   . Diverticulosis   . Dysrhythmia   . Gout   .  Hemorrhoids   . Hyperlipidemia   . Hypertension   . Internal hemorrhoids without mention of complication   . Myocardial infarction ([place])    LAST 12/16/12  . ST elevation (STEMI) myocardial infarction involving left anterior descending coronary artery ([place]) 06/20/2016    Tobacco Use: History  Smoking Status  . Never Smoker  Smokeless Tobacco  . Never Used    Labs: Recent Review Flowsheet Data    Labs for ITP Cardiac and Pulmonary Rehab Latest Ref Rng & Units 05/26/2014 05/28/2015 03/09/2016 06/20/2016 06/20/2016   Cholestrol 0 - 200 mg/dL 119 118 - 120 -   LDLCALC 0 - 99 mg/dL 72 74 - 70 -   HDL >40 mg/dL 35.50(L) 29.90(L) - 32(L) -   Trlycerides <150 mg/dL 57.0 69.0 - 88 -   Hemoglobin A1c 4.8 - 5.6 % 6.4 - 5.7 6.3(H) 6.3(H)   TCO2 0 - 100 mmol/L - - - 18 -       Exercise Target Goals:    Exercise Program Goal: Individual exercise prescription set with THRR, safety & activity barriers. Participant demonstrates ability to understand and report RPE using BORG scale, to self-measure pulse accurately, and to acknowledge the importance of the exercise prescription.  Exercise Prescription Goal: Starting with aerobic activity 30 plus minutes a day, 3 days per week for initial exercise prescription. Provide  home exercise prescription and guidelines that participant acknowledges understanding prior to discharge.  Activity Barriers & Risk Stratification:     Activity Barriers & Cardiac Risk Stratification - 07/03/16 1346      Activity Barriers & Cardiac Risk Stratification   Activity Barriers Arthritis;Deconditioning;Joint Problems   Comments left shoulder fell off a bus years ago and landed on his shoulder so Don can't lift it up over his head. Timmothy Sours reports  "both of my knees are bad".    Cardiac Risk Stratification High      6 Minute Walk:     6 Minute Walk    Row Name 07/03/16 1317         6 Minute Walk   Distance 1390 feet     Walk Time 6 minutes     MPH 2.63     METS  3.2     RPE 12     VO2 Peak 11.25     Resting HR 67 bpm     Resting BP 130/86     Max Ex. HR 84 bpm     Max Ex. BP 144/92        Initial Exercise Prescription:     Initial Exercise Prescription - 07/03/16 1300      Date of Initial Exercise RX and Referring Provider   Date 07/03/16   Referring Provider Arida     Treadmill   MPH 2.6   Grade 0.5   Minutes 15   METs 3.17     Bike   Level 1.3   Minutes 15     Recumbant Bike   Level 2   RPM 60   Minutes 15   METs 3.14     NuStep   Level 2   Watts 50   METs 2.6     REL-XR   Level 2   Minutes 15   METs 3     T5 Nustep   Level 2   Minutes 15   METs 2.9     Prescription Details   Frequency (times per week) 3   Duration Progress to 45 minutes of aerobic exercise without signs/symptoms of physical distress     Intensity   THRR 40-80% of Max Heartrate 104-141   Ratings of Perceived Exertion 11-15     Progression   Progression Continue to progress workloads to maintain intensity without signs/symptoms of physical distress.     Resistance Training   Training Prescription Yes   Weight 3      Perform Capillary Blood Glucose checks as needed.  Exercise Prescription Changes:     Exercise Prescription Changes    Row Name 07/10/16 1400 07/25/16 1000 08/07/16 1100 08/21/16 1100       Exercise Review   Progression Yes Yes Yes Yes      Response to Exercise   Blood Pressure (Admit) 120/80 118/62 138/84 116/78    Blood Pressure (Exercise) 168/84 160/72 164/86 164/70    Blood Pressure (Exit) 122/70 104/72 138/84 110/70    Heart Rate (Admit) 80 bpm 80 bpm 58 bpm 64 bpm    Heart Rate (Exercise) 124 bpm 126 bpm 124 bpm 130 bpm    Heart Rate (Exit) 77 bpm 81 bpm 72 bpm 86 bpm    Rating of Perceived Exertion (Exercise) _0 Symptoms no none none none    Duration Progress to 45 minutes of aerobic exercise without signs/symptoms of physical distress Progress to 45 minutes of aerobic exercise without  signs/symptoms of physical distress Progress to 45 minutes of aerobic exercise without signs/symptoms of physical distress Progress to 45 minutes of aerobic exercise without signs/symptoms of physical distress    Intensity THRR unchanged THRR unchanged THRR unchanged THRR unchanged      Progression   Progression Continue to progress workloads to maintain intensity without signs/symptoms of physical distress. Continue to progress workloads to maintain intensity without signs/symptoms of physical distress. Continue to progress workloads to maintain intensity without signs/symptoms of physical distress. Continue to progress workloads to maintain intensity without signs/symptoms of physical distress.    Average METs 3.26 4.25 4.97  -      Resistance Training   Training Prescription Yes Yes Yes Yes    Weight _0 Interval Training   Interval Training  - No No No      Oxygen   Oxygen  - Intermittent  -  -      Treadmill   MPH _1 Grade 0.5 2.5 2.5 2.5    Minutes _2 METs 3.5 4.54 4.54 4.54      NuStep   Level _3 -    METs 2.8 4.5 4.54  -      Elliptical   Level  -  -  - 1    Minutes  -  -  - 15      Biostep-RELP   Level  - 7  -  -    Minutes  - 15  -  -    METs  - 4  -  -      Home Exercise Plan   Plans to continue exercise at  - [employer] (comment)  [employer] [facility] [place] 2 additional days to program exercise sessions.  -  -       Exercise Comments:     Exercise Comments    Row Name 07/07/16 1753 07/10/16 1440 07/25/16 1018 08/07/16 1152 08/21/16 1131   Exercise Comments First full day of exercise!  Patient was oriented to gym and equipment including functions, settings, policies, and procedures.  Patient's individual exercise prescription and treatment plan were reviewed.  All starting workloads were established based on the results of the 6 minute walk test done at initial orientation visit.  The plan for  exercise progression was also introduced and progression will be customized based on patient's performance and goals Timmothy Sours is progressing well with execise in his first week. Timmothy Sours has done well with exercise and plans to join the [facility]. Timmothy Sours continues to progress well with exercise. Timmothy Sours has progressed well with exercise.      Discharge Exercise Prescription (Final Exercise Prescription Changes):     Exercise Prescription Changes - 08/21/16 1100      Exercise Review   Progression Yes     Response to Exercise   Blood Pressure (Admit) 116/78   Blood Pressure (Exercise) 164/70   Blood Pressure (Exit) 110/70   Heart Rate (Admit) 64 bpm   Heart Rate (Exercise) 130 bpm   Heart Rate (Exit) 86 bpm   Rating of Perceived Exertion (Exercise) 13   Symptoms none   Duration Progress to 45 minutes of aerobic exercise without signs/symptoms of physical distress   Intensity THRR unchanged     Progression   Progression Continue to progress workloads to  maintain intensity without signs/symptoms of physical distress.     Resistance Training   Training Prescription Yes   Weight 4     Interval Training   Interval Training No     Treadmill   MPH 3   Grade 2.5   Minutes 15   METs 4.54     Elliptical   Level 1   Minutes 15      Nutrition:  Target Goals: Understanding of nutrition guidelines, daily intake of sodium <1545m, cholesterol <207m calories 30% from fat and 7% or less from saturated fats, daily to have 5 or more servings of fruits and vegetables.  Biometrics:     Pre Biometrics - 07/03/16 1316      Pre Biometrics   Height 5' 11.5" (1.816 m)   Weight 242 lb 1.6 oz (109.8 kg)   Waist Circumference 45.5 inches   Hip Circumference 47.5 inches   Waist to Hip Ratio 0.96 %   BMI (Calculated) 33.4   Single Leg Stand 30 seconds       Nutrition Therapy Plan and Nutrition Goals:     Nutrition Therapy & Goals - 08/25/16 1727      Personal Nutrition Goals    Personal Goal #1 Encouraged that both she and patient read labels for saturated fat, trans fat and sodium.   Personal Goal #2 Suggested that a fruit or vegetable or both be included in all meals.   Personal Goal #3 Encouraged to measure some portions, especially of starchy foods.      Nutrition Discharge: Rate Your Plate Scores:     Nutrition Assessments - 08/27/16 1813      Rate Your Plate Scores   Post Score 76   Post Score % 84.4 %      Nutrition Goals Re-Evaluation:     Nutrition Goals Re-Evaluation    Row Name 08/25/16 1728             Personal Goal #1 Re-Evaluation   Personal Goal #1 Encouraged that both she and patient read labels for saturated fat, trans fat and sodium.       Goal Progress Seen Yes       Comments Wife does the food prep and she is following guidelines provided by RD         Personal Goal #2 Re-Evaluation   Personal Goal #2 Suggested that a fruit or vegetable or both be included in all meals.  Goal: Continue to follow RD suggestion       Goal Progress Seen Yes       Comments Most meals does have fruit or veg. Eating more fruits than before. Usually fruit is a snack. Goal: Continue to follow RD suggestion         Personal Goal #3 Re-Evaluation   Personal Goal #3 Encouraged to measure some portions, especially of starchy foods.  Goal: Continue to follow RD suggestion       Goal Progress Seen Yes          Psychosocial: Target Goals: Acknowledge presence or absence of depression, maximize coping skills, provide positive support system. Participant is able to verbalize types and ability to use techniques and skills needed for reducing stress and depression.  Initial Review & Psychosocial Screening:     Initial Psych Review & Screening - 07/03/16 13FordYes   Comments Don's left shoulder fell off a bus years ago and landed on his shoulder  so Don can't lift it up over his head. Timmothy Sours reports  "both of my  knees are bad". Timmothy Sours reports he has 6 months of PTO time so is getting paid for being off work. He also said he does not have a copay for Cardiac Rehab since he has met his $5000 deducttible. 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      Screening Interventions   Interventions Encouraged to exercise      Quality of Life Scores:     Quality of Life - 08/27/16 1815      Quality of Life Scores   Health/Function Post 26 %   Socioeconomic Post 24.07 %   Psych/Spiritual Post 28.07 %   Family Post 28.8 %   GLOBAL Post 26.44 %      PHQ-9: Recent Review Flowsheet Data    Depression screen [facility] 2/9 08/27/2016 07/03/2016   Decreased Interest 0 0   Down, Depressed, Hopeless 0 0   PHQ - 2 Score 0 0   Altered sleeping 1 3   Tired, decreased energy 0 3   Change in appetite 0 3   Feeling bad or failure about yourself  0 1   Trouble concentrating 0 1   Moving slowly or fidgety/restless 0 0   Suicidal thoughts 0 0   PHQ-9 Score 1 11   Difficult doing work/chores Not difficult at all Somewhat difficult      Psychosocial Evaluation and Intervention:     Psychosocial Evaluation - 07/07/16 1741      Psychosocial Evaluation & Interventions   Comments Counselor met with Mr. Pustejovsky today for initial psychosocial evaluation.  He is a 60 year old who had a heart attack with a stent inserted on 7/21.  This was his third cardiac episode with a stent inserted in 2004 and another in 2014.  Mr. Seelman also has arthritis that he contends with as well.  He has a strong support system with a spouse of 29 years and (2) adult sons who live close by.  Mr. Matsumura states he does not sleep well with intermittent waking and having difficulty going back to sleep.  He says his appetite is better but still not fully  back to normal.  Mr. Bagot denies a history of depression or anxiety or any current symptoms.  He states his mood is generally positive and he has minimal stress in his life other than a disabled spouse and finances currently.  Mr. Davies has goals to get back his stamina and strength while in this program and hopefully start sleeping better.  He plans to join the [facility] and work out with a buddy following this program to maintain consistency in exercise.  Counselor encouraged Mr. Febo to speak with his Dr. or Pharmacist re: a possible natural OTC sleep aid that is sustained release in order to help with his sleep problems currently.  Counselor will follow with Mr. Mexicano throughout the course of this program.        Psychosocial Re-Evaluation:     Psychosocial Re-Evaluation    Row Name 08/27/16 1813 08/27/16 1814           Psychosocial Re-Evaluation   Interventions Encouraged to attend Cardiac Rehabilitation for the exercise  -  Comments Timmothy Sours has done well in Cardiac Rehab and said it has helped him. His PQ( Post questionarre went from a 11 at pre to 1 which is a much better score.          Vocational Rehabilitation: Provide vocational rehab assistance to qualifying candidates.   Vocational Rehab Evaluation & Intervention:   Education: Education Goals: Education classes will be provided on a weekly basis, covering required topics. Participant will state understanding/return demonstration of topics presented.  Learning Barriers/Preferences:   Education Topics: General Nutrition Guidelines/Fats and Fiber: -Group instruction provided by verbal, written material, models and posters to present the general guidelines for heart healthy nutrition. Gives an explanation and review of dietary fats and fiber. Flowsheet Row Cardiac Rehab from 08/25/2016 in [facility] Cardiac and Pulmonary Rehab  Date  08/18/16  Educator  PI  Instruction Review Code  2- meets goals/outcomes      Controlling  Sodium/Reading Food Labels: -Group verbal and written material supporting the discussion of sodium use in heart healthy nutrition. Review and explanation with models, verbal and written materials for utilization of the food label. Flowsheet Row Cardiac Rehab from 08/25/2016 in [facility] Cardiac and Pulmonary Rehab  Date  08/25/16  Educator  PI  Instruction Review Code  2- meets goals/outcomes      Exercise Physiology & Risk Factors: - Group verbal and written instruction with models to review the exercise physiology of the cardiovascular system and associated critical values. Details cardiovascular disease risk factors and the goals associated with each risk factor. Flowsheet Row Cardiac Rehab from 08/25/2016 in [facility] Cardiac and Pulmonary Rehab  Date  07/07/16  Educator  [facility]  Instruction Review Code  2- meets goals/outcomes      Aerobic Exercise & Resistance Training: - Gives group verbal and written discussion on the health impact of inactivity. On the components of aerobic and resistive training programs and the benefits of this training and how to safely progress through these programs. Flowsheet Row Cardiac Rehab from 08/25/2016 in [facility] Cardiac and Pulmonary Rehab  Date  07/09/16  Educator  A. Sommer  Instruction Review Code  2- meets goals/outcomes      Flexibility, Balance, General Exercise Guidelines: - Provides group verbal and written instruction on the benefits of flexibility and balance training programs. Provides general exercise guidelines with specific guidelines to those with heart or lung disease. Demonstration and skill practice provided. Flowsheet Row Cardiac Rehab from 08/25/2016 in [facility] Cardiac and Pulmonary Rehab  Date  07/14/16  Educator  AS  Instruction Review Code  2- meets goals/outcomes      Stress Management: - Provides group verbal and written instruction about the health risks of elevated stress, cause of high stress, and healthy ways to reduce  stress. Flowsheet Row Cardiac Rehab from 08/25/2016 in [facility] Cardiac and Pulmonary Rehab  Date  07/16/16  Azalia Bilis, MSW  Instruction Review Code  2- meets goals/outcomes      Depression: - Provides group verbal and written instruction on the correlation between heart/lung disease and depressed mood, treatment options, and the stigmas associated with seeking treatment. Flowsheet Row Cardiac Rehab from 08/25/2016 in [facility] Cardiac and Pulmonary Rehab  Date  08/20/16  Educator  [facility]  Instruction Review Code  2- meets goals/outcomes      Anatomy & Physiology of the Heart: - Group verbal and written instruction and models provide basic cardiac anatomy and physiology, with the coronary electrical and arterial systems. Review of: AMI, Angina, Valve disease, Heart Failure,  Cardiac Arrhythmia, Pacemakers, and the ICD. Flowsheet Row Cardiac Rehab from 08/25/2016 in [facility] Cardiac and Pulmonary Rehab  Date  07/21/16  Educator  CE  Instruction Review Code  2- meets goals/outcomes      Cardiac Procedures: - Group verbal and written instruction and models to describe the testing methods done to diagnose heart disease. Reviews the outcomes of the test results. Describes the treatment choices: Medical Management, Angioplasty, or Coronary Bypass Surgery. Flowsheet Row Cardiac Rehab from 08/25/2016 in [facility] Cardiac and Pulmonary Rehab  Date  07/28/16  Educator  [place]  Instruction Review Code  2- meets goals/outcomes      Cardiac Medications: - Group verbal and written instruction to review commonly prescribed medications for heart disease. Reviews the medication, class of the drug, and side effects. Includes the steps to properly store meds and maintain the prescription regimen. Flowsheet Row Cardiac Rehab from 08/25/2016 in [facility] Cardiac and Pulmonary Rehab  Date  08/06/16  Educator  SB  Instruction Review Code  2- meets goals/outcomes      Go Sex-Intimacy & Heart Disease, Get SMART - Goal  Setting: - Group verbal and written instruction through game format to discuss heart disease and the return to sexual intimacy. Provides group verbal and written material to discuss and apply goal setting through the application of the S.M.A.R.T. Method. Flowsheet Row Cardiac Rehab from 08/25/2016 in [facility] Cardiac and Pulmonary Rehab  Date  07/28/16  Educator  [place]  Instruction Review Code  2- meets goals/outcomes      Other Matters of the Heart: - Provides group verbal, written materials and models to describe Heart Failure, Angina, Valve Disease, and Diabetes in the realm of heart disease. Includes description of the disease process and treatment options available to the cardiac patient. Flowsheet Row Cardiac Rehab from 08/25/2016 in [facility] Cardiac and Pulmonary Rehab  Date  07/21/16  Educator  CE  Instruction Review Code  2- meets goals/outcomes      Exercise & Equipment Safety: - Individual verbal instruction and demonstration of equipment use and safety with use of the equipment. Flowsheet Row Cardiac Rehab from 08/25/2016 in [facility] Cardiac and Pulmonary Rehab  Date  07/03/16  Educator  C. Enterkin,RN  Instruction Review Code  1- partially meets, needs review/practice      Infection Prevention: - Provides verbal and written material to individual with discussion of infection control including proper hand washing and proper equipment cleaning during exercise session. Flowsheet Row Cardiac Rehab from 08/25/2016 in [facility] Cardiac and Pulmonary Rehab  Date  07/03/16  Educator  C. EnterkinRN  Instruction Review Code  2- meets goals/outcomes      Falls Prevention: - Provides verbal and written material to individual with discussion of falls prevention and safety. Flowsheet Row Cardiac Rehab from 08/25/2016 in [facility] Cardiac and Pulmonary Rehab  Date  07/03/16  Educator  C. Enterkin,RN  Instruction Review Code  2- meets goals/outcomes      Diabetes: - Individual verbal and written  instruction to review signs/symptoms of diabetes, desired ranges of glucose level fasting, after meals and with exercise. Advice that pre and post exercise glucose checks will be done for 3 sessions at entry of program.    Knowledge Questionnaire Score:     Knowledge Questionnaire Score - 08/27/16 1812      Knowledge Questionnaire Score   Post Score 27      Core Components/Risk Factors/Patient Goals at Admission:     Personal Goals and Risk Factors at Admission - 07/03/16  1352      Core Components/Risk Factors/Patient Goals on Admission    Weight Management Yes   Intervention Weight Management: Provide education and appropriate resources to help participant work on and attain dietary goals.   Admit Weight 242 lb (109.8 kg)   Goal Weight: Short Term 232 lb (105.2 kg)   Goal Weight: Long Term 205 lb (93 kg)   Expected Outcomes Short Term: Continue to assess and modify interventions until short term weight is achieved;Weight Maintenance: Understanding of the daily nutrition guidelines, which includes 25-35% calories from fat, 7% or less cal from saturated fats, less than 211m cholesterol, less than 1.5gm of sodium, & 5 or more servings of fruits and vegetables daily;Weight Loss: Understanding of general recommendations for a balanced deficit meal plan, which promotes 1-2 lb weight loss per week and includes a negative energy balance of [phone] kcal/d;Understanding recommendations for meals to include 15-35% energy as protein, 25-35% energy from fat, 35-60% energy from carbohydrates, less than 2015mof dietary cholesterol, 20-35 gm of total fiber daily;Understanding of distribution of calorie intake throughout the day with the consumption of 4-5 meals/snacks;Weight Gain: Understanding of general recommendations for a high calorie, high protein meal plan that promotes weight gain by distributing calorie intake throughout the day with the consumption for 4-5 meals, snacks, and/or supplements    Increase Strength and Stamina Yes   Intervention Provide advice, education, support and counseling about physical activity/exercise needs.;Develop an individualized exercise prescription for aerobic and resistive training based on initial evaluation findings, risk stratification, comorbidities and participant's personal goals.   Expected Outcomes Achievement of increased cardiorespiratory fitness and enhanced flexibility, muscular endurance and strength shown through measurements of functional capacity and personal statement of participant.   Lipids Yes   Intervention Provide education and support for participant on nutrition & aerobic/resistive exercise along with prescribed medications to achieve LDL <7053mHDL >50m28m Expected Outcomes Short Term: Participant states understanding of desired cholesterol values and is compliant with medications prescribed. Participant is following exercise prescription and nutrition guidelines.;Long Term: Cholesterol controlled with medications as prescribed, with individualized exercise RX and with personalized nutrition plan. Value goals: LDL < 70mg26mL > 40 mg.      Core Components/Risk Factors/Patient Goals Review:      Goals and Risk Factor Review    Row Name 07/14/16 1805 07/24/16 1040 08/25/16 1748         Core Components/Risk Factors/Patient Goals Review   Personal Goals Review Sedentary;Hypertension;Lipids Weight Management/Obesity;Stress;Hypertension Lipids     Review Don iTimmothy Soursn his second week of Heart Track - hewas tired after the first week.  His BP measurements have been good but his cholesterol hasnt been measured recently.  His wife does the cooking and met with the RD to learn healthier techniques. Don's wife met with the RD and has changed what she cooks to the recommendations from the RD.  Don hTimmothy Sourslost 5 lb.  He is not working now so stress levels are low. DonalDaschelloss weight since starting the program. He is down to about 228 from 242. He  has accomplished this by decreasing fat intake,exercising as prescribed.  He goes to gym on none hearttrack days.     Expected Outcomes Don wTimmothy Sours continue to improve strength and stamina with regular exercise and see improved measures for BP and cholesterol. Don wTimmothy Sours continue to exercise and monitor his vitals.  He will see an overall improvement in health. Don wTimmothy Sours continue to exercise and monitor his  vitals.  He will see an overall improvement in health.        Core Components/Risk Factors/Patient Goals at Discharge (Final Review):      Goals and Risk Factor Review - 08/25/16 1748      Core Components/Risk Factors/Patient Goals Review   Personal Goals Review Lipids   Review Hope has loss weight since starting the program. He is down to about 228 from 242. He has accomplished this by decreasing fat intake,exercising as prescribed.  He goes to gym on none hearttrack days.   Expected Outcomes Timmothy Sours will continue to exercise and monitor his vitals.  He will see an overall improvement in health.      ITP Comments:     ITP Comments    Row Name 07/03/16 1348 07/03/16 1351 07/03/16 1354 07/16/16 0847 07/25/16 1021   ITP Comments Don's left shoulder fell off a bus years ago and landed on his shoulder so Don can't lift it up over his head. Timmothy Sours reports  "both of my knees are bad". Timmothy Sours reports he has 6 months of PTO time so is getting paid for being off work. He also said he does not have a copay for Cardiac Rehab since he has met his $5000 deducttible. 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  Timmothy Sours said his wife fixes all his meals. He usually takes left overs for lunch to work.  I emphasized the importance of Don to carry his NTG sl. I showed him a metal pill container that I carry on my key ring. Timmothy Sours  said he had a prescription for NTG after his first heart attack but was not carrying it when he had this last heart attack.  30 day review. Continue with ITP unless changes noted by Medical Director at signature of review. Timmothy Sours has done well with exercise and plans to [facility].   [place] Name 08/07/16 1153 08/13/16 0714         ITP Comments Timmothy Sours continues to progress well with exercise. 30 day review. Continue with ITP unless changes noted by Medical Director at signature of review.         Comments:

## 2016-08-27 NOTE — Progress Notes (Signed)
Cardiac Individual Treatment Plan  Patient Details  Name: Matthew Doyle MRN: [id number] Date of Birth: 02/28/1956 Referring Provider:   Flowsheet Row Cardiac Rehab from 07/03/2016 in [facility]  Referring Provider  Arida      Initial Encounter Date:  Flowsheet Row Cardiac Rehab from 07/03/2016 in [facility]  Date  07/03/16  Referring Provider  Arida      Visit Diagnosis: S/P coronary artery stent placement  Patient's Home Medications on Admission:  Current Outpatient Prescriptions:  .  aspirin 81 MG tablet, Take 1 tablet (81 mg total) by mouth daily., Disp: 30 tablet, Rfl:  .  atorvastatin (LIPITOR) 80 MG tablet, Take 1 tablet (80 mg total) by mouth daily at 6 PM., Disp: 90 tablet, Rfl: 3 .  ezetimibe (ZETIA) 10 MG tablet, Take 1 tablet (10 mg total) by mouth daily., Disp: 90 tablet, Rfl: 3 .  losartan (COZAAR) 100 MG tablet, TAKE ONE TABLET BY MOUTH ONCE DAILY, Disp: 90 tablet, Rfl: 3 .  nitroGLYCERIN (NITROSTAT) 0.4 MG SL tablet, Place 1 tablet (0.4 mg total) under the tongue every 5 (five) minutes as needed for chest pain., Disp: 25 tablet, Rfl: 3 .  ondansetron (ZOFRAN) 4 MG tablet, Take 1 tablet (4 mg total) by mouth every 8 (eight) hours as needed for nausea or vomiting., Disp: 20 tablet, Rfl: 0 .  ticagrelor (BRILINTA) 90 MG TABS tablet, Take 1 tablet (90 mg total) by mouth 2 (two) times daily., Disp: 180 tablet, Rfl: 3  Past Medical History: Past Medical History:  Diagnosis Date  . Arthritis    knee  . Atrial fibrillation (HCC) post MI   . CAD (coronary artery disease)    a. 09/2003 Cath/PCI: LAD 80%->3.0x23 Cypher DES;  b. 03/2004 Cath ;  c. NSTEMI 12/2012 cath patent LAD stent, included distal right PDA supplying a small territory, 99% proximal OM 2 status post PCI and DES placement  d. cath 06/20/2016 anterolateral STEMI stent to LAD, PCTA to diag and LCx  . Colon polyps   . Diverticulosis   . Dysrhythmia   . Gout   .  Hemorrhoids   . Hyperlipidemia   . Hypertension   . Internal hemorrhoids without mention of complication   . Myocardial infarction (HCC)    LAST 12/16/12  . ST elevation (STEMI) myocardial infarction involving left anterior descending coronary artery (HCC) 06/20/2016    Tobacco Use: History  Smoking Status  . Never Smoker  Smokeless Tobacco  . Never Used    Labs: Recent Review Flowsheet Data    Labs for ITP Cardiac and Pulmonary Rehab Latest Ref Rng & Units 05/26/2014 05/28/2015 03/09/2016 06/20/2016 06/20/2016   Cholestrol 0 - 200 mg/dL 119 118 - 120 -   LDLCALC 0 - 99 mg/dL 72 74 - 70 -   HDL >40 mg/dL 35.50(L) 29.90(L) - 32(L) -   Trlycerides <150 mg/dL 57.0 69.0 - 88 -   Hemoglobin A1c 4.8 - 5.6 % 6.4 - 5.7 6.3(H) 6.3(H)   TCO2 0 - 100 mmol/L - - - 18 -       Exercise Target Goals:    Exercise Program Goal: Individual exercise prescription set with THRR, safety & activity barriers. Participant demonstrates ability to understand and report RPE using BORG scale, to self-measure pulse accurately, and to acknowledge the importance of the exercise prescription.  Exercise Prescription Goal: Starting with aerobic activity 30 plus minutes a day, 3 days per week for initial exercise prescription. Provide   home exercise prescription and guidelines that participant acknowledges understanding prior to discharge.  Activity Barriers & Risk Stratification:     Activity Barriers & Cardiac Risk Stratification - 07/03/16 1346      Activity Barriers & Cardiac Risk Stratification   Activity Barriers Arthritis;Deconditioning;Joint Problems   Comments left shoulder fell off a bus years ago and landed on his shoulder so Matthew Doyle can't lift it up over his head. Matthew Doyle reports  "both of my knees are bad".    Cardiac Risk Stratification High      6 Minute Walk:     6 Minute Walk    Row Name 07/03/16 1317         6 Minute Walk   Distance 1390 feet     Walk Time 6 minutes     MPH 2.63     METS  3.2     RPE 12     VO2 Peak 11.25     Resting HR 67 bpm     Resting BP 130/86     Max Ex. HR 84 bpm     Max Ex. BP 144/92        Initial Exercise Prescription:     Initial Exercise Prescription - 07/03/16 1300      Date of Initial Exercise RX and Referring Provider   Date 07/03/16   Referring Provider Arida     Treadmill   MPH 2.6   Grade 0.5   Minutes 15   METs 3.17     Bike   Level 1.3   Minutes 15     Recumbant Bike   Level 2   RPM 60   Minutes 15   METs 3.14     NuStep   Level 2   Watts 50   METs 2.6     REL-XR   Level 2   Minutes 15   METs 3     T5 Nustep   Level 2   Minutes 15   METs 2.9     Prescription Details   Frequency (times per week) 3   Duration Progress to 45 minutes of aerobic exercise without signs/symptoms of physical distress     Intensity   THRR 40-80% of Max Heartrate 104-141   Ratings of Perceived Exertion 11-15     Progression   Progression Continue to progress workloads to maintain intensity without signs/symptoms of physical distress.     Resistance Training   Training Prescription Yes   Weight 3      Perform Capillary Blood Glucose checks as needed.  Exercise Prescription Changes:     Exercise Prescription Changes    Row Name 07/10/16 1400 07/25/16 1000 08/07/16 1100 08/21/16 1100       Exercise Review   Progression Yes Yes Yes Yes      Response to Exercise   Blood Pressure (Admit) 120/80 118/62 138/84 116/78    Blood Pressure (Exercise) 168/84 160/72 164/86 164/70    Blood Pressure (Exit) 122/70 104/72 138/84 110/70    Heart Rate (Admit) 80 bpm 80 bpm 58 bpm 64 bpm    Heart Rate (Exercise) 124 bpm 126 bpm 124 bpm 130 bpm    Heart Rate (Exit) 77 bpm 81 bpm 72 bpm 86 bpm    Rating of Perceived Exertion (Exercise) _0 Symptoms no none none none    Duration Progress to 45 minutes of aerobic exercise without signs/symptoms of physical distress Progress to 45 minutes of aerobic exercise without  signs/symptoms of physical distress Progress to 45 minutes of aerobic exercise without signs/symptoms of physical distress Progress to 45 minutes of aerobic exercise without signs/symptoms of physical distress    Intensity THRR unchanged THRR unchanged THRR unchanged THRR unchanged      Progression   Progression Continue to progress workloads to maintain intensity without signs/symptoms of physical distress. Continue to progress workloads to maintain intensity without signs/symptoms of physical distress. Continue to progress workloads to maintain intensity without signs/symptoms of physical distress. Continue to progress workloads to maintain intensity without signs/symptoms of physical distress.    Average METs 3.26 4.25 4.97  -      Resistance Training   Training Prescription Yes Yes Yes Yes    Weight _0 Interval Training   Interval Training  - No No No      Oxygen   Oxygen  - Intermittent  -  -      Treadmill   MPH _1 Grade 0.5 2.5 2.5 2.5    Minutes _2 METs 3.5 4.54 4.54 4.54      NuStep   Level _3 -    METs 2.8 4.5 4.54  -      Elliptical   Level  -  -  - 1    Minutes  -  -  - 15      Biostep-RELP   Level  - 7  -  -    Minutes  - 15  -  -    METs  - 4  -  -      Home Exercise Plan   Plans to continue exercise at  - [employer] (comment)  [employer] [facility] [place] 2 additional days to program exercise sessions.  -  -       Exercise Comments:     Exercise Comments    Row Name 07/07/16 1753 07/10/16 1440 07/25/16 1018 08/07/16 1152 08/21/16 1131   Exercise Comments First full day of exercise!  Patient was oriented to gym and equipment including functions, settings, policies, and procedures.  Patient's individual exercise prescription and treatment plan were reviewed.  All starting workloads were established based on the results of the 6 minute walk test done at initial orientation visit.  The plan for  exercise progression was also introduced and progression will be customized based on patient's performance and goals Matthew Doyle is progressing well with execise in his first week. Matthew Doyle has done well with exercise and plans to [facility]. Matthew Doyle continues to progress well with exercise. Matthew Doyle has progressed well with exercise.      Discharge Exercise Prescription (Final Exercise Prescription Changes):     Exercise Prescription Changes - 08/21/16 1100      Exercise Review   Progression Yes     Response to Exercise   Blood Pressure (Admit) 116/78   Blood Pressure (Exercise) 164/70   Blood Pressure (Exit) 110/70   Heart Rate (Admit) 64 bpm   Heart Rate (Exercise) 130 bpm   Heart Rate (Exit) 86 bpm   Rating of Perceived Exertion (Exercise) 13   Symptoms none   Duration Progress to 45 minutes of aerobic exercise without signs/symptoms of physical distress   Intensity THRR unchanged     Progression   Progression Continue to progress workloads to  maintain intensity without signs/symptoms of physical distress.     Resistance Training   Training Prescription Yes   Weight 4     Interval Training   Interval Training No     Treadmill   MPH 3   Grade 2.5   Minutes 15   METs 4.54     Elliptical   Level 1   Minutes 15      Nutrition:  Target Goals: Understanding of nutrition guidelines, daily intake of sodium <1500mg, cholesterol <200mg, calories 30% from fat and 7% or less from saturated fats, daily to have 5 or more servings of fruits and vegetables.  Biometrics:     Pre Biometrics - 07/03/16 1316      Pre Biometrics   Height 5' 11.5" (1.816 m)   Weight 242 lb 1.6 oz (109.8 kg)   Waist Circumference 45.5 inches   Hip Circumference 47.5 inches   Waist to Hip Ratio 0.96 %   BMI (Calculated) 33.4   Single Leg Stand 30 seconds       Nutrition Therapy Plan and Nutrition Goals:     Nutrition Therapy & Goals - 08/25/16 1727      Personal Nutrition Goals    Personal Goal #1 Encouraged that both she and patient read labels for saturated fat, trans fat and sodium.   Personal Goal #2 Suggested that a fruit or vegetable or both be included in all meals.   Personal Goal #3 Encouraged to measure some portions, especially of starchy foods.      Nutrition Discharge: Rate Your Plate Scores:     Nutrition Assessments - 08/27/16 1813      Rate Your Plate Scores   Post Score 76   Post Score % 84.4 %      Nutrition Goals Re-Evaluation:     Nutrition Goals Re-Evaluation    Row Name 08/25/16 1728             Personal Goal #1 Re-Evaluation   Personal Goal #1 Encouraged that both she and patient read labels for saturated fat, trans fat and sodium.       Goal Progress Seen Yes       Comments Wife does the food prep and she is following guidelines provided by RD         Personal Goal #2 Re-Evaluation   Personal Goal #2 Suggested that a fruit or vegetable or both be included in all meals.  Goal: Continue to follow RD suggestion       Goal Progress Seen Yes       Comments Most meals does have fruit or veg. Eating more fruits than before. Usually fruit is a snack. Goal: Continue to follow RD suggestion         Personal Goal #3 Re-Evaluation   Personal Goal #3 Encouraged to measure some portions, especially of starchy foods.  Goal: Continue to follow RD suggestion       Goal Progress Seen Yes          Psychosocial: Target Goals: Acknowledge presence or absence of depression, maximize coping skills, provide positive support system. Participant is able to verbalize types and ability to use techniques and skills needed for reducing stress and depression.  Initial Review & Psychosocial Screening:     Initial Psych Review & Screening - 07/03/16 1351      Family Dynamics   Good Support System? Yes   Comments Matthew Doyle's left shoulder fell off a bus years ago and landed on his shoulder   so Matthew Doyle can't lift it up over his head. Matthew Doyle reports  "both of my  knees are bad". Matthew Doyle reports he has 6 months of PTO time so is getting paid for being off work. He also said he does not have a copay for Cardiac Rehab since he has met his $5000 deducttible. 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      Screening Interventions   Interventions Encouraged to exercise      Quality of Life Scores:     Quality of Life - 08/27/16 1815      Quality of Life Scores   Health/Function Post 26 %   Socioeconomic Post 24.07 %   Psych/Spiritual Post 28.07 %   Family Post 28.8 %   GLOBAL Post 26.44 %      PHQ-9: Recent Review Flowsheet Data    Depression screen [facility] 2/9 08/27/2016 07/03/2016   Decreased Interest 0 0   Down, Depressed, Hopeless 0 0   PHQ - 2 Score 0 0   Altered sleeping 1 3   Tired, decreased energy 0 3   Change in appetite 0 3   Feeling bad or failure about yourself  0 1   Trouble concentrating 0 1   Moving slowly or fidgety/restless 0 0   Suicidal thoughts 0 0   PHQ-9 Score 1 11   Difficult doing work/chores Not difficult at all Somewhat difficult      Psychosocial Evaluation and Intervention:     Psychosocial Evaluation - 07/07/16 1741      Psychosocial Evaluation & Interventions   Comments Counselor met with Matthew Doyle today for initial psychosocial evaluation.  He is a 60 year old who had a heart attack with a stent inserted on 7/21.  This was his third cardiac episode with a stent inserted in 2004 and another in 2014.  Matthew Doyle also has arthritis that he contends with as well.  He has a strong support system with a spouse of 29 years and (2) adult sons who live close by.  Matthew Doyle states he does not sleep well with intermittent waking and having difficulty going back to sleep.  He says his appetite is better but still not fully  back to normal.  Matthew Doyle denies a history of depression or anxiety or any current symptoms.  He states his mood is generally positive and he has minimal stress in his life other than a disabled spouse and finances currently.  Matthew Doyle has goals to get back his stamina and strength while in this program and hopefully start sleeping better.  He plans to join the [facility] and work out with a buddy following this program to maintain consistency in exercise.  Counselor encouraged Matthew Doyle to speak with his Dr. or Pharmacist re: a possible natural OTC sleep aid that is sustained release in order to help with his sleep problems currently.  Counselor will follow with Matthew Doyle throughout the course of this program.        Psychosocial Re-Evaluation:     Psychosocial Re-Evaluation    Row Name 08/27/16 1813 08/27/16 1814           Psychosocial Re-Evaluation   Interventions Encouraged to attend Cardiac Rehabilitation for the exercise  -  Comments Matthew Doyle has done well in Cardiac Rehab and said it has helped him. His PQ( Post questionarre went from a 11 at pre to 1 which is a much better score.          Vocational Rehabilitation: Provide vocational rehab assistance to qualifying candidates.   Vocational Rehab Evaluation & Intervention:   Education: Education Goals: Education classes will be provided on a weekly basis, covering required topics. Participant will state understanding/return demonstration of topics presented.  Learning Barriers/Preferences:   Education Topics: General Nutrition Guidelines/Fats and Fiber: -Group instruction provided by verbal, written material, models and posters to present the general guidelines for heart healthy nutrition. Gives an explanation and review of dietary fats and fiber. Flowsheet Row Cardiac Rehab from 08/25/2016 in [facility]  Date  08/18/16  Educator  PI  Instruction Review Code  2- meets goals/outcomes      Controlling  Sodium/Reading Food Labels: -Group verbal and written material supporting the discussion of sodium use in heart healthy nutrition. Review and explanation with models, verbal and written materials for utilization of the food label. Flowsheet Row Cardiac Rehab from 08/25/2016 in [facility]  Date  08/25/16  Educator  PI  Instruction Review Code  2- meets goals/outcomes      Exercise Physiology & Risk Factors: - Group verbal and written instruction with models to review the exercise physiology of the cardiovascular system and associated critical values. Details cardiovascular disease risk factors and the goals associated with each risk factor. Flowsheet Row Cardiac Rehab from 08/25/2016 in [facility]  Date  07/07/16  Educator  KH  Instruction Review Code  2- meets goals/outcomes      Aerobic Exercise & Resistance Training: - Gives group verbal and written discussion on the health impact of inactivity. On the components of aerobic and resistive training programs and the benefits of this training and how to safely progress through these programs. Flowsheet Row Cardiac Rehab from 08/25/2016 in [facility]  Date  07/09/16  Educator  A. Sommer  Instruction Review Code  2- meets goals/outcomes      Flexibility, Balance, General Exercise Guidelines: - Provides group verbal and written instruction on the benefits of flexibility and balance training programs. Provides general exercise guidelines with specific guidelines to those with heart or lung disease. Demonstration and skill practice provided. Flowsheet Row Cardiac Rehab from 08/25/2016 in [facility]  Date  07/14/16  Educator  AS  Instruction Review Code  2- meets goals/outcomes      Stress Management: - Provides group verbal and written instruction about the health risks of elevated stress, cause of high stress, and healthy ways to reduce  stress. Flowsheet Row Cardiac Rehab from 08/25/2016 in [facility]  Date  07/16/16  Educator  C. Clayton, MSW  Instruction Review Code  2- meets goals/outcomes      Depression: - Provides group verbal and written instruction on the correlation between heart/lung disease and depressed mood, treatment options, and the stigmas associated with seeking treatment. Flowsheet Row Cardiac Rehab from 08/25/2016 in [facility]  Date  08/20/16  Educator  KC  Instruction Review Code  2- meets goals/outcomes      Anatomy & Physiology of the Heart: - Group verbal and written instruction and models provide basic cardiac anatomy and physiology, with the coronary electrical and arterial systems. Review of: AMI, Angina, Valve disease, Heart Failure,   Cardiac Arrhythmia, Pacemakers, and the ICD. Flowsheet Row Cardiac Rehab from 08/25/2016 in [facility]  Date  07/21/16  Educator  CE  Instruction Review Code  2- meets goals/outcomes      Cardiac Procedures: - Group verbal and written instruction and models to describe the testing methods done to diagnose heart disease. Reviews the outcomes of the test results. Describes the treatment choices: Medical Management, Angioplasty, or Coronary Bypass Surgery. Flowsheet Row Cardiac Rehab from 08/25/2016 in [facility]  Date  07/28/16  Educator  MJA  Instruction Review Code  2- meets goals/outcomes      Cardiac Medications: - Group verbal and written instruction to review commonly prescribed medications for heart disease. Reviews the medication, class of the drug, and side effects. Includes the steps to properly store meds and maintain the prescription regimen. Flowsheet Row Cardiac Rehab from 08/25/2016 in [facility]  Date  08/06/16  Educator  SB  Instruction Review Code  2- meets goals/outcomes      Go Sex-Intimacy & Heart Disease, Get SMART - Goal  Setting: - Group verbal and written instruction through game format to discuss heart disease and the return to sexual intimacy. Provides group verbal and written material to discuss and apply goal setting through the application of the S.M.A.R.T. Method. Flowsheet Row Cardiac Rehab from 08/25/2016 in [facility]  Date  07/28/16  Educator  MJA  Instruction Review Code  2- meets goals/outcomes      Other Matters of the Heart: - Provides group verbal, written materials and models to describe Heart Failure, Angina, Valve Disease, and Diabetes in the realm of heart disease. Includes description of the disease process and treatment options available to the cardiac patient. Flowsheet Row Cardiac Rehab from 08/25/2016 in [facility]  Date  07/21/16  Educator  CE  Instruction Review Code  2- meets goals/outcomes      Exercise & Equipment Safety: - Individual verbal instruction and demonstration of equipment use and safety with use of the equipment. Flowsheet Row Cardiac Rehab from 08/25/2016 in [facility]  Date  07/03/16  Educator  C. Cherine Drumgoole,RN  Instruction Review Code  1- partially meets, needs review/practice      Infection Prevention: - Provides verbal and written material to individual with discussion of infection control including proper hand washing and proper equipment cleaning during exercise session. Flowsheet Row Cardiac Rehab from 08/25/2016 in [facility]  Date  07/03/16  Educator  C. EnterkinRN  Instruction Review Code  2- meets goals/outcomes      Falls Prevention: - Provides verbal and written material to individual with discussion of falls prevention and safety. Flowsheet Row Cardiac Rehab from 08/25/2016 in [facility]  Date  07/03/16  Educator  C. Lander Eslick,RN  Instruction Review Code  2- meets goals/outcomes      Diabetes: - Individual verbal and written  instruction to review signs/symptoms of diabetes, desired ranges of glucose level fasting, after meals and with exercise. Advice that pre and post exercise glucose checks will be done for 3 sessions at entry of program.    Knowledge Questionnaire Score:     Knowledge Questionnaire Score - 08/27/16 1812      Knowledge Questionnaire Score   Post Score 27      Core Components/Risk Factors/Patient Goals at Admission:     Personal Goals and Risk Factors at Admission - 07/03/16   1352      Core Components/Risk Factors/Patient Goals on Admission    Weight Management Yes   Intervention Weight Management: Provide education and appropriate resources to help participant work on and attain dietary goals.   Admit Weight 242 lb (109.8 kg)   Goal Weight: Short Term 232 lb (105.2 kg)   Goal Weight: Long Term 205 lb (93 kg)   Expected Outcomes Short Term: Continue to assess and modify interventions until short term weight is achieved;Weight Maintenance: Understanding of the daily nutrition guidelines, which includes 25-35% calories from fat, 7% or less cal from saturated fats, less than 200mg cholesterol, less than 1.5gm of sodium, & 5 or more servings of fruits and vegetables daily;Weight Loss: Understanding of general recommendations for a balanced deficit meal plan, which promotes 1-2 lb weight loss per week and includes a negative energy balance of 500-1000 kcal/d;Understanding recommendations for meals to include 15-35% energy as protein, 25-35% energy from fat, 35-60% energy from carbohydrates, less than 200mg of dietary cholesterol, 20-35 gm of total fiber daily;Understanding of distribution of calorie intake throughout the day with the consumption of 4-5 meals/snacks;Weight Gain: Understanding of general recommendations for a high calorie, high protein meal plan that promotes weight gain by distributing calorie intake throughout the day with the consumption for 4-5 meals, snacks, and/or supplements    Increase Strength and Stamina Yes   Intervention Provide advice, education, support and counseling about physical activity/exercise needs.;Develop an individualized exercise prescription for aerobic and resistive training based on initial evaluation findings, risk stratification, comorbidities and participant's personal goals.   Expected Outcomes Achievement of increased cardiorespiratory fitness and enhanced flexibility, muscular endurance and strength shown through measurements of functional capacity and personal statement of participant.   Lipids Yes   Intervention Provide education and support for participant on nutrition & aerobic/resistive exercise along with prescribed medications to achieve LDL <70mg, HDL >40mg.   Expected Outcomes Short Term: Participant states understanding of desired cholesterol values and is compliant with medications prescribed. Participant is following exercise prescription and nutrition guidelines.;Long Term: Cholesterol controlled with medications as prescribed, with individualized exercise RX and with personalized nutrition plan. Value goals: LDL < 70mg, HDL > 40 mg.      Core Components/Risk Factors/Patient Goals Review:      Goals and Risk Factor Review    Row Name 07/14/16 1805 07/24/16 1040 08/25/16 1748         Core Components/Risk Factors/Patient Goals Review   Personal Goals Review Sedentary;Hypertension;Lipids Weight Management/Obesity;Stress;Hypertension Lipids     Review Matthew Doyle is in his second week of Heart Track - hewas tired after the first week.  His BP measurements have been good but his cholesterol hasnt been measured recently.  His wife does the cooking and met with the RD to learn healthier techniques. Matthew Doyle's wife met with the RD and has changed what she cooks to the recommendations from the RD.  Matthew Doyle has lost 5 lb.  He is not working now so stress levels are low. Dnaiel has loss weight since starting the program. He is down to about 228 from 242. He  has accomplished this by decreasing fat intake,exercising as prescribed.  He goes to gym on none hearttrack days.     Expected Outcomes Matthew Doyle will continue to improve strength and stamina with regular exercise and see improved measures for BP and cholesterol. Matthew Doyle will continue to exercise and monitor his vitals.  He will see an overall improvement in health. Matthew Doyle will continue to exercise and monitor his   vitals.  He will see an overall improvement in health.        Core Components/Risk Factors/Patient Goals at Discharge (Final Review):      Goals and Risk Factor Review - 08/25/16 1748      Core Components/Risk Factors/Patient Goals Review   Personal Goals Review Lipids   Review Matthew Doyle has loss weight since starting the program. He is down to about 228 from 242. He has accomplished this by decreasing fat intake,exercising as prescribed.  He goes to gym on none hearttrack days.   Expected Outcomes Matthew Doyle will continue to exercise and monitor his vitals.  He will see an overall improvement in health.      ITP Comments:     ITP Comments    Row Name 07/03/16 1348 07/03/16 1351 07/03/16 1354 07/16/16 0847 07/25/16 1021   ITP Comments Matthew Doyle's left shoulder fell off a bus years ago and landed on his shoulder so Matthew Doyle can't lift it up over his head. Matthew Doyle reports  "both of my knees are bad". Matthew Doyle reports he has 6 months of PTO time so is getting paid for being off work. He also said he does not have a copay for Cardiac Rehab since he has met his $5000 deducttible. Matthew Doyle said he has taken more time off of work for his wife's medical problems Ie back surgery, radiation for sinus cancer?. 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  Matthew Doyle said his wife fixes all his meals. He usually takes left overs for lunch to work.  I emphasized the importance of Matthew Doyle to carry his NTG sl. I showed him a metal pill container that I carry on my key ring. Matthew Doyle  said he had a prescription for NTG after his first heart attack but was not carrying it when he had this last heart attack.  30 day review. Continue with ITP unless changes noted by Medical Director at signature of review. Matthew Doyle has done well with exercise and plans to [facility] [place].   Row Name 08/07/16 1153 08/13/16 0714         ITP Comments Matthew Doyle continues to progress well with exercise. 30 day review. Continue with ITP unless changes noted by Medical Director at signature of review.         Comments:  

## 2016-08-27 NOTE — Patient Instructions (Addendum)
Discharge Instructions  Patient Details  Name: Matthew Doyle MRN: [id number] Date of Birth: 10-25-1956 Referring Provider:  Wellington Hampshire, MD   Number of Visits:   Reason for Discharge:  Patient reached a stable level of exercise. Patient independent in their exercise.  Smoking History:  History  Smoking Status  . Never Smoker  Smokeless Tobacco  . Never Used    Diagnosis:  S/P coronary artery stent placement  Initial Exercise Prescription:     Initial Exercise Prescription - 07/03/16 1300      Date of Initial Exercise RX and Referring Provider   Date 07/03/16   Referring Provider Arida     Treadmill   MPH 2.6   Grade 0.5   Minutes 15   METs 3.17     Bike   Level 1.3   Minutes 15     Recumbant Bike   Level 2   RPM 60   Minutes 15   METs 3.14     NuStep   Level 2   Watts 50   METs 2.6     REL-XR   Level 2   Minutes 15   METs 3     T5 Nustep   Level 2   Minutes 15   METs 2.9     Prescription Details   Frequency (times per week) 3   Duration Progress to 45 minutes of aerobic exercise without signs/symptoms of physical distress     Intensity   THRR 40-80% of Max Heartrate 104-141   Ratings of Perceived Exertion 11-15     Progression   Progression Continue to progress workloads to maintain intensity without signs/symptoms of physical distress.     Resistance Training   Training Prescription Yes   Weight 3      Discharge Exercise Prescription (Final Exercise Prescription Changes):     Exercise Prescription Changes - 08/21/16 1100      Exercise Review   Progression Yes     Response to Exercise   Blood Pressure (Admit) 116/78   Blood Pressure (Exercise) 164/70   Blood Pressure (Exit) 110/70   Heart Rate (Admit) 64 bpm   Heart Rate (Exercise) 130 bpm   Heart Rate (Exit) 86 bpm   Rating of Perceived Exertion (Exercise) 13   Symptoms none   Duration Progress to 45 minutes of aerobic exercise without signs/symptoms of  physical distress   Intensity THRR unchanged     Progression   Progression Continue to progress workloads to maintain intensity without signs/symptoms of physical distress.     Resistance Training   Training Prescription Yes   Weight 4     Interval Training   Interval Training No     Treadmill   MPH 3   Grade 2.5   Minutes 15   METs 4.54     Elliptical   Level 1   Minutes 15      Functional Capacity:     6 Minute Walk    Row Name 07/03/16 1317         6 Minute Walk   Distance 1390 feet     Walk Time 6 minutes     MPH 2.63     METS 3.2     RPE 12     VO2 Peak 11.25     Resting HR 67 bpm     Resting BP 130/86     Max Ex. HR 84 bpm     Max Ex. BP 144/92  Quality of Life:     Quality of Life - 08/27/16 1815      Quality of Life Scores   Health/Function Post 26 %   Socioeconomic Post 24.07 %   Psych/Spiritual Post 28.07 %   Family Post 28.8 %   GLOBAL Post 26.44 %      Personal Goals: Goals established at orientation with interventions provided to work toward goal.     Personal Goals and Risk Factors at Admission - 07/03/16 1352      Core Components/Risk Factors/Patient Goals on Admission    Weight Management Yes   Intervention Weight Management: Provide education and appropriate resources to help participant work on and attain dietary goals.   Admit Weight 242 lb (109.8 kg)   Goal Weight: Short Term 232 lb (105.2 kg)   Goal Weight: Long Term 205 lb (93 kg)   Expected Outcomes Short Term: Continue to assess and modify interventions until short term weight is achieved;Weight Maintenance: Understanding of the daily nutrition guidelines, which includes 25-35% calories from fat, 7% or less cal from saturated fats, less than 200mg  cholesterol, less than 1.5gm of sodium, & 5 or more servings of fruits and vegetables daily;Weight Loss: Understanding of general recommendations for a balanced deficit meal plan, which promotes 1-2 lb weight loss per  week and includes a negative energy balance of [phone] kcal/d;Understanding recommendations for meals to include 15-35% energy as protein, 25-35% energy from fat, 35-60% energy from carbohydrates, less than 200mg  of dietary cholesterol, 20-35 gm of total fiber daily;Understanding of distribution of calorie intake throughout the day with the consumption of 4-5 meals/snacks;Weight Gain: Understanding of general recommendations for a high calorie, high protein meal plan that promotes weight gain by distributing calorie intake throughout the day with the consumption for 4-5 meals, snacks, and/or supplements   Increase Strength and Stamina Yes   Intervention Provide advice, education, support and counseling about physical activity/exercise needs.;Develop an individualized exercise prescription for aerobic and resistive training based on initial evaluation findings, risk stratification, comorbidities and participant's personal goals.   Expected Outcomes Achievement of increased cardiorespiratory fitness and enhanced flexibility, muscular endurance and strength shown through measurements of functional capacity and personal statement of participant.   Lipids Yes   Intervention Provide education and support for participant on nutrition & aerobic/resistive exercise along with prescribed medications to achieve LDL 70mg , HDL >40mg .   Expected Outcomes Short Term: Participant states understanding of desired cholesterol values and is compliant with medications prescribed. Participant is following exercise prescription and nutrition guidelines.;Long Term: Cholesterol controlled with medications as prescribed, with individualized exercise RX and with personalized nutrition plan. Value goals: LDL < 70mg , HDL > 40 mg.       Personal Goals Discharge:     Goals and Risk Factor Review - 08/25/16 1748      Core Components/Risk Factors/Patient Goals Review   Personal Goals Review Lipids   Review Daylan has loss weight  since starting the program. He is down to about 228 from 242. He has accomplished this by decreasing fat intake,exercising as prescribed.  He goes to gym on none hearttrack days.   Expected Outcomes Timmothy Sours will continue to exercise and monitor his vitals.  He will see an overall improvement in health.      Nutrition & Weight - Outcomes:     Pre Biometrics - 07/03/16 1316      Pre Biometrics   Height 5' 11.5" (1.816 m)   Weight 242 lb 1.6 oz (109.8 kg)  Waist Circumference 45.5 inches   Hip Circumference 47.5 inches   Waist to Hip Ratio 0.96 %   BMI (Calculated) 33.4   Single Leg Stand 30 seconds       Nutrition:     Nutrition Therapy & Goals - 08/25/16 1727      Personal Nutrition Goals   Personal Goal #1 Encouraged that both she and patient read labels for saturated fat, trans fat and sodium.   Personal Goal #2 Suggested that a fruit or vegetable or both be included in all meals.   Personal Goal #3 Encouraged to measure some portions, especially of starchy foods.      Nutrition Discharge:     Nutrition Assessments - 08/27/16 1813      Rate Your Plate Scores   Post Score 76   Post Score % 84.4 %      Education Questionnaire Score:     Knowledge Questionnaire Score - 08/27/16 1812      Knowledge Questionnaire Score   Post Score 27      Goals reviewed with patient; copy given to patient. Discharge Instructions  Patient Details  Name: Matthew Doyle MRN: [id number] Date of Birth: 11/17/1956 Referring Provider:  Wellington Hampshire, MD   Number of Visits:   Reason for Discharge:  Patient reached a stable level of exercise. Patient independent in their exercise.  Smoking History:  History  Smoking Status  . Never Smoker  Smokeless Tobacco  . Never Used    Diagnosis:  S/P coronary artery stent placement  Initial Exercise Prescription:     Initial Exercise Prescription - 07/03/16 1300      Date of Initial Exercise RX and Referring Provider    Date 07/03/16   Referring Provider Arida     Treadmill   MPH 2.6   Grade 0.5   Minutes 15   METs 3.17     Bike   Level 1.3   Minutes 15     Recumbant Bike   Level 2   RPM 60   Minutes 15   METs 3.14     NuStep   Level 2   Watts 50   METs 2.6     REL-XR   Level 2   Minutes 15   METs 3     T5 Nustep   Level 2   Minutes 15   METs 2.9     Prescription Details   Frequency (times per week) 3   Duration Progress to 45 minutes of aerobic exercise without signs/symptoms of physical distress     Intensity   THRR 40-80% of Max Heartrate 104-141   Ratings of Perceived Exertion 11-15     Progression   Progression Continue to progress workloads to maintain intensity without signs/symptoms of physical distress.     Resistance Training   Training Prescription Yes   Weight 3      Discharge Exercise Prescription (Final Exercise Prescription Changes):     Exercise Prescription Changes - 08/21/16 1100      Exercise Review   Progression Yes     Response to Exercise   Blood Pressure (Admit) 116/78   Blood Pressure (Exercise) 164/70   Blood Pressure (Exit) 110/70   Heart Rate (Admit) 64 bpm   Heart Rate (Exercise) 130 bpm   Heart Rate (Exit) 86 bpm   Rating of Perceived Exertion (Exercise) 13   Symptoms none   Duration Progress to 45 minutes of aerobic exercise without signs/symptoms of physical distress  Intensity THRR unchanged     Progression   Progression Continue to progress workloads to maintain intensity without signs/symptoms of physical distress.     Resistance Training   Training Prescription Yes   Weight 4     Interval Training   Interval Training No     Treadmill   MPH 3   Grade 2.5   Minutes 15   METs 4.54     Elliptical   Level 1   Minutes 15      Functional Capacity:     6 Minute Walk    Row Name 07/03/16 1317         6 Minute Walk   Distance 1390 feet     Walk Time 6 minutes     MPH 2.63     METS 3.2     RPE 12      VO2 Peak 11.25     Resting HR 67 bpm     Resting BP 130/86     Max Ex. HR 84 bpm     Max Ex. BP 144/92        Quality of Life:     Quality of Life - 08/27/16 1815      Quality of Life Scores   Health/Function Post 26 %   Socioeconomic Post 24.07 %   Psych/Spiritual Post 28.07 %   Family Post 28.8 %   GLOBAL Post 26.44 %      Personal Goals: Goals established at orientation with interventions provided to work toward goal.     Personal Goals and Risk Factors at Admission - 07/03/16 1352      Core Components/Risk Factors/Patient Goals on Admission    Weight Management Yes   Intervention Weight Management: Provide education and appropriate resources to help participant work on and attain dietary goals.   Admit Weight 242 lb (109.8 kg)   Goal Weight: Short Term 232 lb (105.2 kg)   Goal Weight: Long Term 205 lb (93 kg)   Expected Outcomes Short Term: Continue to assess and modify interventions until short term weight is achieved;Weight Maintenance: Understanding of the daily nutrition guidelines, which includes 25-35% calories from fat, 7% or less cal from saturated fats, less than 200mg  cholesterol, less than 1.5gm of sodium, & 5 or more servings of fruits and vegetables daily;Weight Loss: Understanding of general recommendations for a balanced deficit meal plan, which promotes 1-2 lb weight loss per week and includes a negative energy balance of [phone] kcal/d;Understanding recommendations for meals to include 15-35% energy as protein, 25-35% energy from fat, 35-60% energy from carbohydrates, less than 200mg  of dietary cholesterol, 20-35 gm of total fiber daily;Understanding of distribution of calorie intake throughout the day with the consumption of 4-5 meals/snacks;Weight Gain: Understanding of general recommendations for a high calorie, high protein meal plan that promotes weight gain by distributing calorie intake throughout the day with the consumption for 4-5 meals, snacks,  and/or supplements   Increase Strength and Stamina Yes   Intervention Provide advice, education, support and counseling about physical activity/exercise needs.;Develop an individualized exercise prescription for aerobic and resistive training based on initial evaluation findings, risk stratification, comorbidities and participant's personal goals.   Expected Outcomes Achievement of increased cardiorespiratory fitness and enhanced flexibility, muscular endurance and strength shown through measurements of functional capacity and personal statement of participant.   Lipids Yes   Intervention Provide education and support for participant on nutrition & aerobic/resistive exercise along with prescribed medications to achieve LDL 70mg , HDL >40mg .  Expected Outcomes Short Term: Participant states understanding of desired cholesterol values and is compliant with medications prescribed. Participant is following exercise prescription and nutrition guidelines.;Long Term: Cholesterol controlled with medications as prescribed, with individualized exercise RX and with personalized nutrition plan. Value goals: LDL < 70mg , HDL > 40 mg.       Personal Goals Discharge:     Goals and Risk Factor Review - 08/25/16 1748      Core Components/Risk Factors/Patient Goals Review   Personal Goals Review Lipids   Review Valin has loss weight since starting the program. He is down to about 228 from 242. He has accomplished this by decreasing fat intake,exercising as prescribed.  He goes to gym on none hearttrack days.   Expected Outcomes Timmothy Sours will continue to exercise and monitor his vitals.  He will see an overall improvement in health.      Nutrition & Weight - Outcomes:     Pre Biometrics - 07/03/16 1316      Pre Biometrics   Height 5' 11.5" (1.816 m)   Weight 242 lb 1.6 oz (109.8 kg)   Waist Circumference 45.5 inches   Hip Circumference 47.5 inches   Waist to Hip Ratio 0.96 %   BMI (Calculated) 33.4    Single Leg Stand 30 seconds       Nutrition:     Nutrition Therapy & Goals - 08/25/16 1727      Personal Nutrition Goals   Personal Goal #1 Encouraged that both she and patient read labels for saturated fat, trans fat and sodium.   Personal Goal #2 Suggested that a fruit or vegetable or both be included in all meals.   Personal Goal #3 Encouraged to measure some portions, especially of starchy foods.      Nutrition Discharge:     Nutrition Assessments - 08/27/16 1813      Rate Your Plate Scores   Post Score 76   Post Score % 84.4 %      Education Questionnaire Score:     Knowledge Questionnaire Score - 08/27/16 1812      Knowledge Questionnaire Score   Post Score 27      Goals reviewed with patient; copy given to patient.

## 2016-08-27 NOTE — Progress Notes (Signed)
Discharge Summary  Patient Details  Name: Matthew Doyle MRN: [id number] Date of Birth: 12/13/1955 Referring Provider:   Flowsheet Row Cardiac Rehab from 07/03/2016 in [facility] Cardiac and Pulmonary Rehab  Referring Provider  [place]       Number of Visits:   Reason for Discharge:  Patient reached a stable level of exercise. Patient independent in their exercise.  Smoking History:  History  Smoking Status  . Never Smoker  Smokeless Tobacco  . Never Used    Diagnosis:  S/P coronary artery stent placement  ADL UCSD:   Initial Exercise Prescription:     Initial Exercise Prescription - 07/03/16 1300      Date of Initial Exercise RX and Referring Provider   Date 07/03/16   Referring Provider Arida     Treadmill   MPH 2.6   Grade 0.5   Minutes 15   METs 3.17     Bike   Level 1.3   Minutes 15     Recumbant Bike   Level 2   RPM 60   Minutes 15   METs 3.14     NuStep   Level 2   Watts 50   METs 2.6     REL-XR   Level 2   Minutes 15   METs 3     T5 Nustep   Level 2   Minutes 15   METs 2.9     Prescription Details   Frequency (times per week) 3   Duration Progress to 45 minutes of aerobic exercise without signs/symptoms of physical distress     Intensity   THRR 40-80% of Max Heartrate 104-141   Ratings of Perceived Exertion 11-15     Progression   Progression Continue to progress workloads to maintain intensity without signs/symptoms of physical distress.     Resistance Training   Training Prescription Yes   Weight 3      Discharge Exercise Prescription (Final Exercise Prescription Changes):     Exercise Prescription Changes - 08/21/16 1100      Exercise Review   Progression Yes     Response to Exercise   Blood Pressure (Admit) 116/78   Blood Pressure (Exercise) 164/70   Blood Pressure (Exit) 110/70   Heart Rate (Admit) 64 bpm   Heart Rate (Exercise) 130 bpm   Heart Rate (Exit) 86 bpm   Rating of Perceived Exertion (Exercise) 13    Symptoms none   Duration Progress to 45 minutes of aerobic exercise without signs/symptoms of physical distress   Intensity THRR unchanged     Progression   Progression Continue to progress workloads to maintain intensity without signs/symptoms of physical distress.     Resistance Training   Training Prescription Yes   Weight 4     Interval Training   Interval Training No     Treadmill   MPH 3   Grade 2.5   Minutes 15   METs 4.54     Elliptical   Level 1   Minutes 15      Functional Capacity:     6 Minute Walk    Row Name 07/03/16 1317         6 Minute Walk   Distance 1390 feet     Walk Time 6 minutes     MPH 2.63     METS 3.2     RPE 12     VO2 Peak 11.25     Resting HR 67 bpm     Resting BP  130/86     Max Ex. HR 84 bpm     Max Ex. BP 144/92        Psychological, QOL, Others - Outcomes: PHQ 2/9: Depression screen [facility] 2/9 08/27/2016 07/03/2016  Decreased Interest 0 0  Down, Depressed, Hopeless 0 0  PHQ - 2 Score 0 0  Altered sleeping 1 3  Tired, decreased energy 0 3  Change in appetite 0 3  Feeling bad or failure about yourself  0 1  Trouble concentrating 0 1  Moving slowly or fidgety/restless 0 0  Suicidal thoughts 0 0  PHQ-9 Score 1 11  Difficult doing work/chores Not difficult at all Somewhat difficult    Quality of Life:     Quality of Life - 08/27/16 1815      Quality of Life Scores   Health/Function Post 26 %   Socioeconomic Post 24.07 %   Psych/Spiritual Post 28.07 %   Family Post 28.8 %   GLOBAL Post 26.44 %      Personal Goals: Goals established at orientation with interventions provided to work toward goal.     Personal Goals and Risk Factors at Admission - 07/03/16 1352      Core Components/Risk Factors/Patient Goals on Admission    Weight Management Yes   Intervention Weight Management: Provide education and appropriate resources to help participant work on and attain dietary goals.   Admit Weight 242 lb (109.8 kg)    Goal Weight: Short Term 232 lb (105.2 kg)   Goal Weight: Long Term 205 lb (93 kg)   Expected Outcomes Short Term: Continue to assess and modify interventions until short term weight is achieved;Weight Maintenance: Understanding of the daily nutrition guidelines, which includes 25-35% calories from fat, 7% or less cal from saturated fats, less than 242m cholesterol, less than 1.5gm of sodium, & 5 or more servings of fruits and vegetables daily;Weight Loss: Understanding of general recommendations for a balanced deficit meal plan, which promotes 1-2 lb weight loss per week and includes a negative energy balance of [phone] kcal/d;Understanding recommendations for meals to include 15-35% energy as protein, 25-35% energy from fat, 35-60% energy from carbohydrates, less than 20105mof dietary cholesterol, 20-35 gm of total fiber daily;Understanding of distribution of calorie intake throughout the day with the consumption of 4-5 meals/snacks;Weight Gain: Understanding of general recommendations for a high calorie, high protein meal plan that promotes weight gain by distributing calorie intake throughout the day with the consumption for 4-5 meals, snacks, and/or supplements   Increase Strength and Stamina Yes   Intervention Provide advice, education, support and counseling about physical activity/exercise needs.;Develop an individualized exercise prescription for aerobic and resistive training based on initial evaluation findings, risk stratification, comorbidities and participant's personal goals.   Expected Outcomes Achievement of increased cardiorespiratory fitness and enhanced flexibility, muscular endurance and strength shown through measurements of functional capacity and personal statement of participant.   Lipids Yes   Intervention Provide education and support for participant on nutrition & aerobic/resistive exercise along with prescribed medications to achieve LDL <7018mHDL >42m72m Expected Outcomes  Short Term: Participant states understanding of desired cholesterol values and is compliant with medications prescribed. Participant is following exercise prescription and nutrition guidelines.;Long Term: Cholesterol controlled with medications as prescribed, with individualized exercise RX and with personalized nutrition plan. Value goals: LDL < 70mg77mL > 40 mg.       Personal Goals Discharge:     Goals and Risk Factor Review    Row Name  07/14/16 1805 07/24/16 1040 08/25/16 1748         Core Components/Risk Factors/Patient Goals Review   Personal Goals Review Sedentary;Hypertension;Lipids Weight Management/Obesity;Stress;Hypertension Lipids     Review Matthew Doyle is in his second week of Heart Track - hewas tired after the first week.  His BP measurements have been good but his cholesterol hasnt been measured recently.  His wife does the cooking and met with the RD to learn healthier techniques. Matthew Doyle's wife met with the RD and has changed what she cooks to the recommendations from the RD.  Matthew Doyle has lost 5 lb.  He is not working now so stress levels are low. Almus has loss weight since starting the program. He is down to about 228 from 242. He has accomplished this by decreasing fat intake,exercising as prescribed.  He goes to gym on none hearttrack days.     Expected Outcomes Matthew Doyle will continue to improve strength and stamina with regular exercise and see improved measures for BP and cholesterol. Matthew Doyle will continue to exercise and monitor his vitals.  He will see an overall improvement in health. Matthew Doyle will continue to exercise and monitor his vitals.  He will see an overall improvement in health.        Nutrition & Weight - Outcomes:     Pre Biometrics - 07/03/16 1316      Pre Biometrics   Height 5' 11.5" (1.816 m)   Weight 242 lb 1.6 oz (109.8 kg)   Waist Circumference 45.5 inches   Hip Circumference 47.5 inches   Waist to Hip Ratio 0.96 %   BMI (Calculated) 33.4   Single Leg Stand 30 seconds        Nutrition:     Nutrition Therapy & Goals - 08/25/16 1727      Personal Nutrition Goals   Personal Goal #1 Encouraged that both she and patient read labels for saturated fat, trans fat and sodium.   Personal Goal #2 Suggested that a fruit or vegetable or both be included in all meals.   Personal Goal #3 Encouraged to measure some portions, especially of starchy foods.      Nutrition Discharge:     Nutrition Assessments - 08/27/16 1813      Rate Your Plate Scores   Post Score 76   Post Score % 84.4 %      Education Questionnaire Score:     Knowledge Questionnaire Score - 08/27/16 1812      Knowledge Questionnaire Score   Post Score 27      Goals reviewed with patient; copy given to patient.

## 2016-08-27 NOTE — Progress Notes (Signed)
Daily Session Note  Patient Details  Name: Matthew Doyle MRN: [id number] Date of Birth: [birth year] Referring Provider:   Flowsheet Row Cardiac Rehab from 07/03/2016 in [facility] Cardiac and Pulmonary Rehab  Referring Provider  [place]      Encounter Date: 08/27/2016  Check In:     Session Check In - 08/27/16 1723      Check-In   Staff Present Gerlene Burdock, RN, BSN;Susanne Bice, RN, BSN, Lance Sell, BA, ACSM CEP, Exercise Physiologist   Supervising physician immediately available to respond to emergencies See telemetry face sheet for immediately available ER MD   Medication changes reported     No   Fall or balance concerns reported    No   Warm-up and Cool-down Performed on first and last piece of equipment   Resistance Training Performed Yes   VAD Patient? No     Pain Assessment   Currently in Pain? No/denies         Goals Met:  Exercise tolerated well No report of cardiac concerns or symptoms Strength training completed today  Goals Unmet:  Not Applicable  Comments: Doing well with exercise prescription progression.    Dr. Emily Filbert is Medical Director for [place] and [facility].

## 2016-08-28 ENCOUNTER — Other Ambulatory Visit: Payer: Self-pay | Admitting: Neurology

## 2016-08-28 VITALS — Ht 71.5 in

## 2016-08-28 DIAGNOSIS — Z955 Presence of coronary angioplasty implant and graft: Secondary | ICD-10-CM | POA: Diagnosis not present

## 2016-08-28 DIAGNOSIS — R29898 Other symptoms and signs involving the musculoskeletal system: Secondary | ICD-10-CM

## 2016-08-28 NOTE — Progress Notes (Signed)
Discharge Summary  Patient Details  Name: Matthew Doyle MRN: [id number] Date of Birth: 1956-01-23 Referring Provider:   Flowsheet Row Cardiac Rehab from 07/03/2016 in [facility] Cardiac and Pulmonary Rehab  Referring Provider  [place]       Number of Visits: 76   Reason for Discharge:  Patient reached a stable level of exercise. Patient independent in their exercise.  Smoking History:  History  Smoking Status  . Never Smoker  Smokeless Tobacco  . Never Used    Diagnosis:  S/P coronary artery stent placement  ADL UCSD:   Initial Exercise Prescription:     Initial Exercise Prescription - 07/03/16 1300      Date of Initial Exercise RX and Referring Provider   Date 07/03/16   Referring Provider Arida     Treadmill   MPH 2.6   Grade 0.5   Minutes 15   METs 3.17     Bike   Level 1.3   Minutes 15     Recumbant Bike   Level 2   RPM 60   Minutes 15   METs 3.14     NuStep   Level 2   Watts 50   METs 2.6     REL-XR   Level 2   Minutes 15   METs 3     T5 Nustep   Level 2   Minutes 15   METs 2.9     Prescription Details   Frequency (times per week) 3   Duration Progress to 45 minutes of aerobic exercise without signs/symptoms of physical distress     Intensity   THRR 40-80% of Max Heartrate 104-141   Ratings of Perceived Exertion 11-15     Progression   Progression Continue to progress workloads to maintain intensity without signs/symptoms of physical distress.     Resistance Training   Training Prescription Yes   Weight 3      Discharge Exercise Prescription (Final Exercise Prescription Changes):     Exercise Prescription Changes - 08/21/16 1100      Exercise Review   Progression Yes     Response to Exercise   Blood Pressure (Admit) 116/78   Blood Pressure (Exercise) 164/70   Blood Pressure (Exit) 110/70   Heart Rate (Admit) 64 bpm   Heart Rate (Exercise) 130 bpm   Heart Rate (Exit) 86 bpm   Rating of Perceived Exertion (Exercise) 13    Symptoms none   Duration Progress to 45 minutes of aerobic exercise without signs/symptoms of physical distress   Intensity THRR unchanged     Progression   Progression Continue to progress workloads to maintain intensity without signs/symptoms of physical distress.     Resistance Training   Training Prescription Yes   Weight 4     Interval Training   Interval Training No     Treadmill   MPH 3   Grade 2.5   Minutes 15   METs 4.54     Elliptical   Level 1   Minutes 15      Functional Capacity:     6 Minute Walk    Row Name 07/03/16 1317 08/28/16 1625       6 Minute Walk   Phase  - Discharge    Distance 1390 feet 1733 feet    Distance % Change  - 24.6 %    Walk Time 6 minutes 6 minutes    # of Rest Breaks  - 0    MPH 2.63 3.28  METS 3.2 4.24    RPE 12 12    VO2 Peak 11.25 14.84    Symptoms  - No    Resting HR 67 bpm  -    Resting BP 130/86  -    Max Ex. HR 84 bpm  -    Max Ex. BP 144/92  -       Psychological, QOL, Others - Outcomes: PHQ 2/9: Depression screen [facility] 2/9 08/27/2016 07/03/2016  Decreased Interest 0 0  Down, Depressed, Hopeless 0 0  PHQ - 2 Score 0 0  Altered sleeping 1 3  Tired, decreased energy 0 3  Change in appetite 0 3  Feeling bad or failure about yourself  0 1  Trouble concentrating 0 1  Moving slowly or fidgety/restless 0 0  Suicidal thoughts 0 0  PHQ-9 Score 1 11  Difficult doing work/chores Not difficult at all Somewhat difficult    Quality of Life:     Quality of Life - 08/27/16 1815      Quality of Life Scores   Health/Function Post 26 %   Socioeconomic Post 24.07 %   Psych/Spiritual Post 28.07 %   Family Post 28.8 %   GLOBAL Post 26.44 %      Personal Goals: Goals established at orientation with interventions provided to work toward goal.     Personal Goals and Risk Factors at Admission - 07/03/16 1352      Core Components/Risk Factors/Patient Goals on Admission    Weight Management Yes   Intervention  Weight Management: Provide education and appropriate resources to help participant work on and attain dietary goals.   Admit Weight 242 lb (109.8 kg)   Goal Weight: Short Term 232 lb (105.2 kg)   Goal Weight: Long Term 205 lb (93 kg)   Expected Outcomes Short Term: Continue to assess and modify interventions until short term weight is achieved;Weight Maintenance: Understanding of the daily nutrition guidelines, which includes 25-35% calories from fat, 7% or less cal from saturated fats, less than 25m cholesterol, less than 1.5gm of sodium, & 5 or more servings of fruits and vegetables daily;Weight Loss: Understanding of general recommendations for a balanced deficit meal plan, which promotes 1-2 lb weight loss per week and includes a negative energy balance of [phone] kcal/d;Understanding recommendations for meals to include 15-35% energy as protein, 25-35% energy from fat, 35-60% energy from carbohydrates, less than 2052mof dietary cholesterol, 20-35 gm of total fiber daily;Understanding of distribution of calorie intake throughout the day with the consumption of 4-5 meals/snacks;Weight Gain: Understanding of general recommendations for a high calorie, high protein meal plan that promotes weight gain by distributing calorie intake throughout the day with the consumption for 4-5 meals, snacks, and/or supplements   Increase Strength and Stamina Yes   Intervention Provide advice, education, support and counseling about physical activity/exercise needs.;Develop an individualized exercise prescription for aerobic and resistive training based on initial evaluation findings, risk stratification, comorbidities and participant's personal goals.   Expected Outcomes Achievement of increased cardiorespiratory fitness and enhanced flexibility, muscular endurance and strength shown through measurements of functional capacity and personal statement of participant.   Lipids Yes   Intervention Provide education and  support for participant on nutrition & aerobic/resistive exercise along with prescribed medications to achieve LDL <7063mHDL >52m57m Expected Outcomes Short Term: Participant states understanding of desired cholesterol values and is compliant with medications prescribed. Participant is following exercise prescription and nutrition guidelines.;Long Term: Cholesterol controlled with medications as prescribed, with  individualized exercise RX and with personalized nutrition plan. Value goals: LDL < 34m, HDL > 40 mg.       Personal Goals Discharge:     Goals and Risk Factor Review    Row Name 07/14/16 1805 07/24/16 1040 08/25/16 1748         Core Components/Risk Factors/Patient Goals Review   Personal Goals Review Sedentary;Hypertension;Lipids Weight Management/Obesity;Stress;Hypertension Lipids     Review DTimmothy Soursis in his second week of Heart Track - hewas tired after the first week.  His BP measurements have been good but his cholesterol hasnt been measured recently.  His wife does the cooking and met with the RD to learn healthier techniques. Don's wife met with the RD and has changed what she cooks to the recommendations from the RD.  DTimmothy Sourshas lost 5 lb.  He is not working now so stress levels are low. DKailenhas loss weight since starting the program. He is down to about 228 from 242. He has accomplished this by decreasing fat intake,exercising as prescribed.  He goes to gym on none hearttrack days.     Expected Outcomes DTimmothy Sourswill continue to improve strength and stamina with regular exercise and see improved measures for BP and cholesterol. DTimmothy Sourswill continue to exercise and monitor his vitals.  He will see an overall improvement in health. DTimmothy Sourswill continue to exercise and monitor his vitals.  He will see an overall improvement in health.        Nutrition & Weight - Outcomes:     Pre Biometrics - 07/03/16 1316      Pre Biometrics   Height 5' 11.5" (1.816 m)   Weight 242 lb 1.6 oz (109.8 kg)    Waist Circumference 45.5 inches   Hip Circumference 47.5 inches   Waist to Hip Ratio 0.96 %   BMI (Calculated) 33.4   Single Leg Stand 30 seconds         Post Biometrics - 08/28/16 1624       Post  Biometrics   Height 5' 11.5" (1.816 m)   Waist Circumference 44.5 inches   Hip Circumference 46 inches   Waist to Hip Ratio 0.97 %      Nutrition:     Nutrition Therapy & Goals - 08/25/16 1727      Personal Nutrition Goals   Personal Goal #1 Encouraged that both she and patient read labels for saturated fat, trans fat and sodium.   Personal Goal #2 Suggested that a fruit or vegetable or both be included in all meals.   Personal Goal #3 Encouraged to measure some portions, especially of starchy foods.      Nutrition Discharge:     Nutrition Assessments - 08/27/16 1813      Rate Your Plate Scores   Post Score 76   Post Score % 84.4 %      Education Questionnaire Score:     Knowledge Questionnaire Score - 08/27/16 1812      Knowledge Questionnaire Score   Post Score 27      Goals reviewed with patient; copy given to patient.

## 2016-08-28 NOTE — Progress Notes (Signed)
Daily Session Note  Patient Details  Name: Matthew Doyle MRN: [id number] Date of Birth: July 26, 1956 Referring Provider:   Flowsheet Row Cardiac Rehab from 07/03/2016 in [facility] Cardiac and Pulmonary Rehab  Referring Provider  Arida      Encounter Date: 08/28/2016  Check In:     Session Check In - 08/28/16 1629      Check-In   Location [facility]-Cardiac & Pulmonary Rehab   Staff Present Heath Lark, RN, BSN, CCRP;Laureen Owens Shark, BS, RRT, Respiratory Dareen Piano, BA, ACSM CEP, Exercise Physiologist   Supervising physician immediately available to respond to emergencies See telemetry face sheet for immediately available ER MD   Medication changes reported     No   Fall or balance concerns reported    No   Warm-up and Cool-down Performed on first and last piece of equipment   Resistance Training Performed Yes   VAD Patient? No     Pain Assessment   Currently in Pain? No/denies         Goals Met:  Independence with exercise equipment Exercise tolerated well No report of cardiac concerns or symptoms Strength training completed today  Goals Unmet:  Not Applicable  Comments:  Dempsey graduated today from cardiac rehab with 36 sessions completed.  Details of the patient's exercise prescription and what he needs to do in order to continue the prescription and progress were discussed with patient.  Patient was given a copy of prescription and goals.  Patient verbalized understanding.  Kayden plans to continue to exercise by joining the [facility].    Dr. Emily Filbert is Medical Director for [place] and [facility].

## 2016-08-28 NOTE — Patient Instructions (Signed)
Discharge Instructions  Patient Details  Name: Matthew Doyle MRN: [id number] Date of Birth: [birth year] Referring Provider:  Wellington Hampshire, MD   Number of Visits: 43  Reason for Discharge:  Patient reached a stable level of exercise. Patient independent in their exercise.  Smoking History:  History  Smoking Status  . Never Smoker  Smokeless Tobacco  . Never Used    Diagnosis:  S/P coronary artery stent placement  Initial Exercise Prescription:     Initial Exercise Prescription - 07/03/16 1300      Date of Initial Exercise RX and Referring Provider   Date 07/03/16   Referring Provider Arida     Treadmill   MPH 2.6   Grade 0.5   Minutes 15   METs 3.17     Bike   Level 1.3   Minutes 15     Recumbant Bike   Level 2   RPM 60   Minutes 15   METs 3.14     NuStep   Level 2   Watts 50   METs 2.6     REL-XR   Level 2   Minutes 15   METs 3     T5 Nustep   Level 2   Minutes 15   METs 2.9     Prescription Details   Frequency (times per week) 3   Duration Progress to 45 minutes of aerobic exercise without signs/symptoms of physical distress     Intensity   THRR 40-80% of Max Heartrate 104-141   Ratings of Perceived Exertion 11-15     Progression   Progression Continue to progress workloads to maintain intensity without signs/symptoms of physical distress.     Resistance Training   Training Prescription Yes   Weight 3      Discharge Exercise Prescription (Final Exercise Prescription Changes):     Exercise Prescription Changes - 08/21/16 1100      Exercise Review   Progression Yes     Response to Exercise   Blood Pressure (Admit) 116/78   Blood Pressure (Exercise) 164/70   Blood Pressure (Exit) 110/70   Heart Rate (Admit) 64 bpm   Heart Rate (Exercise) 130 bpm   Heart Rate (Exit) 86 bpm   Rating of Perceived Exertion (Exercise) 13   Symptoms none   Duration Progress to 45 minutes of aerobic exercise without signs/symptoms of  physical distress   Intensity THRR unchanged     Progression   Progression Continue to progress workloads to maintain intensity without signs/symptoms of physical distress.     Resistance Training   Training Prescription Yes   Weight 4     Interval Training   Interval Training No     Treadmill   MPH 3   Grade 2.5   Minutes 15   METs 4.54     Elliptical   Level 1   Minutes 15      Functional Capacity:     Anthony Name 07/03/16 1317 08/28/16 1625       6 Minute Walk   Phase  - Discharge    Distance 1390 feet 1733 feet    Distance % Change  - 24.6 %    Walk Time 6 minutes 6 minutes    # of Rest Breaks  - 0    MPH 2.63 3.28    METS 3.2 4.24    RPE 12 12    VO2 Peak 11.25 14.84    Symptoms  -  No    Resting HR 67 bpm  -    Resting BP 130/86  -    Max Ex. HR 84 bpm  -    Max Ex. BP 144/92  -       Quality of Life:     Quality of Life - 08/27/16 1815      Quality of Life Scores   Health/Function Post 26 %   Socioeconomic Post 24.07 %   Psych/Spiritual Post 28.07 %   Family Post 28.8 %   GLOBAL Post 26.44 %      Personal Goals: Goals established at orientation with interventions provided to work toward goal.     Personal Goals and Risk Factors at Admission - 07/03/16 1352      Core Components/Risk Factors/Patient Goals on Admission    Weight Management Yes   Intervention Weight Management: Provide education and appropriate resources to help participant work on and attain dietary goals.   Admit Weight 242 lb (109.8 kg)   Goal Weight: Short Term 232 lb (105.2 kg)   Goal Weight: Long Term 205 lb (93 kg)   Expected Outcomes Short Term: Continue to assess and modify interventions until short term weight is achieved;Weight Maintenance: Understanding of the daily nutrition guidelines, which includes 25-35% calories from fat, 7% or less cal from saturated fats, less than 200mg  cholesterol, less than 1.5gm of sodium, & 5 or more servings of  fruits and vegetables daily;Weight Loss: Understanding of general recommendations for a balanced deficit meal plan, which promotes 1-2 lb weight loss per week and includes a negative energy balance of [phone] kcal/d;Understanding recommendations for meals to include 15-35% energy as protein, 25-35% energy from fat, 35-60% energy from carbohydrates, less than 200mg  of dietary cholesterol, 20-35 gm of total fiber daily;Understanding of distribution of calorie intake throughout the day with the consumption of 4-5 meals/snacks;Weight Gain: Understanding of general recommendations for a high calorie, high protein meal plan that promotes weight gain by distributing calorie intake throughout the day with the consumption for 4-5 meals, snacks, and/or supplements   Increase Strength and Stamina Yes   Intervention Provide advice, education, support and counseling about physical activity/exercise needs.;Develop an individualized exercise prescription for aerobic and resistive training based on initial evaluation findings, risk stratification, comorbidities and participant's personal goals.   Expected Outcomes Achievement of increased cardiorespiratory fitness and enhanced flexibility, muscular endurance and strength shown through measurements of functional capacity and personal statement of participant.   Lipids Yes   Intervention Provide education and support for participant on nutrition & aerobic/resistive exercise along with prescribed medications to achieve LDL 70mg , HDL >40mg .   Expected Outcomes Short Term: Participant states understanding of desired cholesterol values and is compliant with medications prescribed. Participant is following exercise prescription and nutrition guidelines.;Long Term: Cholesterol controlled with medications as prescribed, with individualized exercise RX and with personalized nutrition plan. Value goals: LDL < 70mg , HDL > 40 mg.       Personal Goals Discharge:     Goals and Risk  Factor Review - 08/25/16 1748      Core Components/Risk Factors/Patient Goals Review   Personal Goals Review Lipids   Review Silverio has loss weight since starting the program. He is down to about 228 from 242. He has accomplished this by decreasing fat intake,exercising as prescribed.  He goes to gym on none hearttrack days.   Expected Outcomes Timmothy Sours will continue to exercise and monitor his vitals.  He will see an overall improvement in health.  Nutrition & Weight - Outcomes:     Pre Biometrics - 07/03/16 1316      Pre Biometrics   Height 5' 11.5" (1.816 m)   Weight 242 lb 1.6 oz (109.8 kg)   Waist Circumference 45.5 inches   Hip Circumference 47.5 inches   Waist to Hip Ratio 0.96 %   BMI (Calculated) 33.4   Single Leg Stand 30 seconds         Post Biometrics - 08/28/16 1624       Post  Biometrics   Height 5' 11.5" (1.816 m)   Waist Circumference 44.5 inches   Hip Circumference 46 inches   Waist to Hip Ratio 0.97 %      Nutrition:     Nutrition Therapy & Goals - 08/25/16 1727      Personal Nutrition Goals   Personal Goal #1 Encouraged that both she and patient read labels for saturated fat, trans fat and sodium.   Personal Goal #2 Suggested that a fruit or vegetable or both be included in all meals.   Personal Goal #3 Encouraged to measure some portions, especially of starchy foods.      Nutrition Discharge:     Nutrition Assessments - 08/27/16 1813      Rate Your Plate Scores   Post Score 76   Post Score % 84.4 %      Education Questionnaire Score:     Knowledge Questionnaire Score - 08/27/16 1812      Knowledge Questionnaire Score   Post Score 27      Goals reviewed with patient; copy given to patient.

## 2016-08-28 NOTE — Progress Notes (Signed)
Cardiac Individual Treatment Plan  Patient Details  Name: Matthew Doyle MRN: [id number] Date of Birth: 08-15-1956 Referring Provider:   Flowsheet Row Cardiac Rehab from 07/03/2016 in [facility] Cardiac and Pulmonary Rehab  Referring Provider  Arida      Initial Encounter Date:  Flowsheet Row Cardiac Rehab from 07/03/2016 in [facility] Cardiac and Pulmonary Rehab  Date  07/03/16  Referring Provider  Fletcher Anon      Visit Diagnosis: S/P coronary artery stent placement  Patient's Home Medications on Admission:  Current Outpatient Prescriptions:  .  aspirin 81 MG tablet, Take 1 tablet (81 mg total) by mouth daily., Disp: 30 tablet, Rfl:  .  atorvastatin (LIPITOR) 80 MG tablet, Take 1 tablet (80 mg total) by mouth daily at 6 PM., Disp: 90 tablet, Rfl: 3 .  ezetimibe (ZETIA) 10 MG tablet, Take 1 tablet (10 mg total) by mouth daily., Disp: 90 tablet, Rfl: 3 .  losartan (COZAAR) 100 MG tablet, TAKE ONE TABLET BY MOUTH ONCE DAILY, Disp: 90 tablet, Rfl: 3 .  nitroGLYCERIN (NITROSTAT) 0.4 MG SL tablet, Place 1 tablet (0.4 mg total) under the tongue every 5 (five) minutes as needed for chest pain., Disp: 25 tablet, Rfl: 3 .  ondansetron (ZOFRAN) 4 MG tablet, Take 1 tablet (4 mg total) by mouth every 8 (eight) hours as needed for nausea or vomiting., Disp: 20 tablet, Rfl: 0 .  ticagrelor (BRILINTA) 90 MG TABS tablet, Take 1 tablet (90 mg total) by mouth 2 (two) times daily., Disp: 180 tablet, Rfl: 3  Past Medical History: Past Medical History:  Diagnosis Date  . Arthritis    knee  . Atrial fibrillation ([place]) post MI   . CAD (coronary artery disease)    a. 09/2003 Cath/PCI: LAD 80%->3.0x23 Cypher DES;  b. 03/2004 Cath ;  c. NSTEMI 12/2012 cath patent LAD stent, included distal right PDA supplying a small territory, 99% proximal OM 2 status post PCI and DES placement  d. cath 06/20/2016 anterolateral STEMI stent to LAD, PCTA to diag and LCx  . Colon polyps   . Diverticulosis   . Dysrhythmia   . Gout   .  Hemorrhoids   . Hyperlipidemia   . Hypertension   . Internal hemorrhoids without mention of complication   . Myocardial infarction ([place])    LAST 12/16/12  . ST elevation (STEMI) myocardial infarction involving left anterior descending coronary artery ([place]) 06/20/2016    Tobacco Use: History  Smoking Status  . Never Smoker  Smokeless Tobacco  . Never Used    Labs: Recent Review Flowsheet Data    Labs for ITP Cardiac and Pulmonary Rehab Latest Ref Rng & Units 05/26/2014 05/28/2015 03/09/2016 06/20/2016 06/20/2016   Cholestrol 0 - 200 mg/dL 119 118 - 120 -   LDLCALC 0 - 99 mg/dL 72 74 - 70 -   HDL >40 mg/dL 35.50(L) 29.90(L) - 32(L) -   Trlycerides <150 mg/dL 57.0 69.0 - 88 -   Hemoglobin A1c 4.8 - 5.6 % 6.4 - 5.7 6.3(H) 6.3(H)   TCO2 0 - 100 mmol/L - - - 18 -       Exercise Target Goals:    Exercise Program Goal: Individual exercise prescription set with THRR, safety & activity barriers. Participant demonstrates ability to understand and report RPE using BORG scale, to self-measure pulse accurately, and to acknowledge the importance of the exercise prescription.  Exercise Prescription Goal: Starting with aerobic activity 30 plus minutes a day, 3 days per week for initial exercise prescription. Provide  home exercise prescription and guidelines that participant acknowledges understanding prior to discharge.  Activity Barriers & Risk Stratification:     Activity Barriers & Cardiac Risk Stratification - 07/03/16 1346      Activity Barriers & Cardiac Risk Stratification   Activity Barriers Arthritis;Deconditioning;Joint Problems   Comments left shoulder fell off a bus years ago and landed on his shoulder so Matthew Doyle can't lift it up over his head. Matthew Doyle reports  "both of my knees are bad".    Cardiac Risk Stratification High      6 Minute Walk:     6 Minute Walk    Row Name 07/03/16 1317 08/28/16 1625       6 Minute Walk   Phase  - Discharge    Distance 1390 feet 1733 feet     Distance % Change  - 24.6 %    Walk Time 6 minutes 6 minutes    # of Rest Breaks  - 0    MPH 2.63 3.28    METS 3.2 4.24    RPE 12 12    VO2 Peak 11.25 14.84    Symptoms  - No    Resting HR 67 bpm  -    Resting BP 130/86  -    Max Ex. HR 84 bpm  -    Max Ex. BP 144/92  -       Initial Exercise Prescription:     Initial Exercise Prescription - 07/03/16 1300      Date of Initial Exercise RX and Referring Provider   Date 07/03/16   Referring Provider Arida     Treadmill   MPH 2.6   Grade 0.5   Minutes 15   METs 3.17     Bike   Level 1.3   Minutes 15     Recumbant Bike   Level 2   RPM 60   Minutes 15   METs 3.14     NuStep   Level 2   Watts 50   METs 2.6     REL-XR   Level 2   Minutes 15   METs 3     T5 Nustep   Level 2   Minutes 15   METs 2.9     Prescription Details   Frequency (times per week) 3   Duration Progress to 45 minutes of aerobic exercise without signs/symptoms of physical distress     Intensity   THRR 40-80% of Max Heartrate 104-141   Ratings of Perceived Exertion 11-15     Progression   Progression Continue to progress workloads to maintain intensity without signs/symptoms of physical distress.     Resistance Training   Training Prescription Yes   Weight 3      Perform Capillary Blood Glucose checks as needed.  Exercise Prescription Changes:     Exercise Prescription Changes    Row Name 07/10/16 1400 07/25/16 1000 08/07/16 1100 08/21/16 1100       Exercise Review   Progression Yes Yes Yes Yes      Response to Exercise   Blood Pressure (Admit) 120/80 118/62 138/84 116/78    Blood Pressure (Exercise) 168/84 160/72 164/86 164/70    Blood Pressure (Exit) 122/70 104/72 138/84 110/70    Heart Rate (Admit) 80 bpm 80 bpm 58 bpm 64 bpm    Heart Rate (Exercise) 124 bpm 126 bpm 124 bpm 130 bpm    Heart Rate (Exit) 77 bpm 81 bpm 72 bpm 86 bpm    Rating  of Perceived Exertion (Exercise) '10 13 12 13    '$ Symptoms no none none none     Duration Progress to 45 minutes of aerobic exercise without signs/symptoms of physical distress Progress to 45 minutes of aerobic exercise without signs/symptoms of physical distress Progress to 45 minutes of aerobic exercise without signs/symptoms of physical distress Progress to 45 minutes of aerobic exercise without signs/symptoms of physical distress    Intensity THRR unchanged THRR unchanged THRR unchanged THRR unchanged      Progression   Progression Continue to progress workloads to maintain intensity without signs/symptoms of physical distress. Continue to progress workloads to maintain intensity without signs/symptoms of physical distress. Continue to progress workloads to maintain intensity without signs/symptoms of physical distress. Continue to progress workloads to maintain intensity without signs/symptoms of physical distress.    Average METs 3.26 4.25 4.97  -      Resistance Training   Training Prescription Yes Yes Yes Yes    Weight '3 4 4 4      '$ Interval Training   Interval Training  - No No No      Oxygen   Oxygen  - Intermittent  -  -      Treadmill   MPH '3 3 3 3    '$ Grade 0.5 2.5 2.5 2.5    Minutes '15 15 15 15    '$ METs 3.5 4.54 4.54 4.54      NuStep   Level '4 7 7  '$ -    METs 2.8 4.5 4.54  -      Elliptical   Level  -  -  - 1    Minutes  -  -  - 15      Biostep-RELP   Level  - 7  -  -    Minutes  - 15  -  -    METs  - 4  -  -      Home Exercise Plan   Plans to continue exercise at  - [employer] (comment)  [employer] [facility] [place] 2 additional days to program exercise sessions.  -  -       Exercise Comments:     Exercise Comments    Row Name 07/07/16 1753 07/10/16 1440 07/25/16 1018 08/07/16 1152 08/21/16 1131   Exercise Comments First full day of exercise!  Patient was oriented to gym and equipment including functions, settings, policies, and procedures.  Patient's individual exercise prescription and treatment plan were  reviewed.  All starting workloads were established based on the results of the 6 minute walk test done at initial orientation visit.  The plan for exercise progression was also introduced and progression will be customized based on patient's performance and goals Matthew Doyle is progressing well with execise in his first week. Matthew Doyle has done well with exercise and plans [facility]. Matthew Doyle continues to progress well with exercise. Matthew Doyle has progressed well with exercise.      Discharge Exercise Prescription (Final Exercise Prescription Changes):     Exercise Prescription Changes - 08/21/16 1100      Exercise Review   Progression Yes     Response to Exercise   Blood Pressure (Admit) 116/78   Blood Pressure (Exercise) 164/70   Blood Pressure (Exit) 110/70   Heart Rate (Admit) 64 bpm   Heart Rate (Exercise) 130 bpm   Heart Rate (Exit) 86 bpm   Rating of  Perceived Exertion (Exercise) 13   Symptoms none   Duration Progress to 45 minutes of aerobic exercise without signs/symptoms of physical distress   Intensity THRR unchanged     Progression   Progression Continue to progress workloads to maintain intensity without signs/symptoms of physical distress.     Resistance Training   Training Prescription Yes   Weight 4     Interval Training   Interval Training No     Treadmill   MPH 3   Grade 2.5   Minutes 15   METs 4.54     Elliptical   Level 1   Minutes 15      Nutrition:  Target Goals: Understanding of nutrition guidelines, daily intake of sodium '1500mg'$ , cholesterol '200mg'$ , calories 30% from fat and 7% or less from saturated fats, daily to have 5 or more servings of fruits and vegetables.  Biometrics:     Pre Biometrics - 07/03/16 1316      Pre Biometrics   Height 5' 11.5" (1.816 m)   Weight 242 lb 1.6 oz (109.8 kg)   Waist Circumference 45.5 inches   Hip Circumference 47.5 inches   Waist to Hip Ratio 0.96 %   BMI (Calculated) 33.4   Single Leg Stand 30 seconds          Post Biometrics - 08/28/16 1624       Post  Biometrics   Height 5' 11.5" (1.816 m)   Waist Circumference 44.5 inches   Hip Circumference 46 inches   Waist to Hip Ratio 0.97 %      Nutrition Therapy Plan and Nutrition Goals:     Nutrition Therapy & Goals - 08/25/16 1727      Personal Nutrition Goals   Personal Goal #1 Encouraged that both she and patient read labels for saturated fat, trans fat and sodium.   Personal Goal #2 Suggested that a fruit or vegetable or both be included in all meals.   Personal Goal #3 Encouraged to measure some portions, especially of starchy foods.      Nutrition Discharge: Rate Your Plate Scores:     Nutrition Assessments - 08/27/16 1813      Rate Your Plate Scores   Post Score 76   Post Score % 84.4 %      Nutrition Goals Re-Evaluation:     Nutrition Goals Re-Evaluation    Row Name 08/25/16 1728             Personal Goal #1 Re-Evaluation   Personal Goal #1 Encouraged that both she and patient read labels for saturated fat, trans fat and sodium.       Goal Progress Seen Yes       Comments Wife does the food prep and she is following guidelines provided by RD         Personal Goal #2 Re-Evaluation   Personal Goal #2 Suggested that a fruit or vegetable or both be included in all meals.  Goal: Continue to follow RD suggestion       Goal Progress Seen Yes       Comments Most meals does have fruit or veg. Eating more fruits than before. Usually fruit is a snack. Goal: Continue to follow RD suggestion         Personal Goal #3 Re-Evaluation   Personal Goal #3 Encouraged to measure some portions, especially of starchy foods.  Goal: Continue to follow RD suggestion       Goal Progress Seen Yes  Psychosocial: Target Goals: Acknowledge presence or absence of depression, maximize coping skills, provide positive support system. Participant is able to verbalize types and ability to use techniques and skills needed for  reducing stress and depression.  Initial Review & Psychosocial Screening:     Initial Psych Review & Screening - 07/03/16 [place]? Yes   Comments Matthew Doyle's left shoulder fell off a bus years ago and landed on his shoulder so Matthew Doyle can't lift it up over his head. Matthew Doyle reports  "both of my knees are bad". Matthew Doyle reports he has 6 months of PTO time so is getting paid for being off work. He also said he does not have a copay for Cardiac Rehab since he has met his $5000 deducttible. 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      Screening Interventions   Interventions Encouraged to exercise      Quality of Life Scores:     Quality of Life - 08/27/16 1815      Quality of Life Scores   Health/Function Post 26 %   Socioeconomic Post 24.07 %   Psych/Spiritual Post 28.07 %   Family Post 28.8 %   GLOBAL Post 26.44 %      PHQ-9: Recent Review Flowsheet Data    Depression screen [facility] 2/9 08/27/2016 07/03/2016   Decreased Interest 0 0   Down, Depressed, Hopeless 0 0   PHQ - 2 Score 0 0   Altered sleeping 1 3   Tired, decreased energy 0 3   Change in appetite 0 3   Feeling bad or failure about yourself  0 1   Trouble concentrating 0 1   Moving slowly or fidgety/restless 0 0   Suicidal thoughts 0 0   PHQ-9 Score 1 11   Difficult doing work/chores Not difficult at all Somewhat difficult      Psychosocial Evaluation and Intervention:     Psychosocial Evaluation - 07/07/16 1741      Psychosocial Evaluation & Interventions   Comments Counselor met with Mr. Clason today for initial psychosocial evaluation.  He is a 60 year old who had a heart attack with a stent inserted on 7/21.  This was his third cardiac episode with a stent inserted in 2004 and another  in 2014.  Mr. Crotteau also has arthritis that he contends with as well.  He has a strong support system with a spouse of 71 years and (2) adult sons who live close by.  Mr. Delage states he does not sleep well with intermittent waking and having difficulty going back to sleep.  He says his appetite is better but still not fully back to normal.  Mr. Pischke denies a history of depression or anxiety or any current symptoms.  He states his mood is generally positive and he has minimal stress in his life other than a disabled spouse and finances currently.  Mr. Ramnauth has goals to get back his stamina and strength while in this program and hopefully start sleeping better.  He plans to join the [facility] and work out with a buddy following this program to maintain consistency in exercise.  Counselor encouraged Mr. Dani to speak with his Dr. or Pharmacist re: a  possible natural OTC sleep aid that is sustained release in order to help with his sleep problems currently.  Counselor will follow with Mr. Baehr throughout the course of this program.        Psychosocial Re-Evaluation:     Psychosocial Re-Evaluation    Row Name 08/27/16 1813 08/27/16 1814           Psychosocial Re-Evaluation   Interventions Encouraged to attend Cardiac Rehabilitation for the exercise  -      Comments Matthew Doyle has done well in Cardiac Rehab and said it has helped him. His PQ( Post questionarre went from a 11 at pre to 1 which is a much better score.          Vocational Rehabilitation: Provide vocational rehab assistance to qualifying candidates.   Vocational Rehab Evaluation & Intervention:   Education: Education Goals: Education classes will be provided on a weekly basis, covering required topics. Participant will state understanding/return demonstration of topics presented.  Learning Barriers/Preferences:   Education Topics: General Nutrition Guidelines/Fats and Fiber: -Group instruction provided by verbal, written  material, models and posters to present the general guidelines for heart healthy nutrition. Gives an explanation and review of dietary fats and fiber. Flowsheet Row Cardiac Rehab from 08/25/2016 in [facility] Cardiac and Pulmonary Rehab  Date  08/18/16  Educator  PI  Instruction Review Code  2- meets goals/outcomes      Controlling Sodium/Reading Food Labels: -Group verbal and written material supporting the discussion of sodium use in heart healthy nutrition. Review and explanation with models, verbal and written materials for utilization of the food label. Flowsheet Row Cardiac Rehab from 08/25/2016 in [facility] Cardiac and Pulmonary Rehab  Date  08/25/16  Educator  PI  Instruction Review Code  2- meets goals/outcomes      Exercise Physiology & Risk Factors: - Group verbal and written instruction with models to review the exercise physiology of the cardiovascular system and associated critical values. Details cardiovascular disease risk factors and the goals associated with each risk factor. Flowsheet Row Cardiac Rehab from 08/25/2016 in [facility] Cardiac and Pulmonary Rehab  Date  07/07/16  Educator  [facility]  Instruction Review Code  2- meets goals/outcomes      Aerobic Exercise & Resistance Training: - Gives group verbal and written discussion on the health impact of inactivity. On the components of aerobic and resistive training programs and the benefits of this training and how to safely progress through these programs. Flowsheet Row Cardiac Rehab from 08/25/2016 in [facility] Cardiac and Pulmonary Rehab  Date  07/09/16  Educator  A. Sommer  Instruction Review Code  2- meets goals/outcomes      Flexibility, Balance, General Exercise Guidelines: - Provides group verbal and written instruction on the benefits of flexibility and balance training programs. Provides general exercise guidelines with specific guidelines to those with heart or lung disease. Demonstration and skill practice provided. Flowsheet  Row Cardiac Rehab from 08/25/2016 in [facility] Cardiac and Pulmonary Rehab  Date  07/14/16  Educator  AS  Instruction Review Code  2- meets goals/outcomes      Stress Management: - Provides group verbal and written instruction about the health risks of elevated stress, cause of high stress, and healthy ways to reduce stress. Flowsheet Row Cardiac Rehab from 08/25/2016 in [facility] Cardiac and Pulmonary Rehab  Date  07/16/16  Educator  Elissa Hefty, MSW  Instruction Review Code  2- meets goals/outcomes      Depression: - Provides group verbal and written instruction on  the correlation between heart/lung disease and depressed mood, treatment options, and the stigmas associated with seeking treatment. Flowsheet Row Cardiac Rehab from 08/25/2016 in [facility] Cardiac and Pulmonary Rehab  Date  08/20/16  Educator  [facility]  Instruction Review Code  2- meets goals/outcomes      Anatomy & Physiology of the Heart: - Group verbal and written instruction and models provide basic cardiac anatomy and physiology, with the coronary electrical and arterial systems. Review of: AMI, Angina, Valve disease, Heart Failure, Cardiac Arrhythmia, Pacemakers, and the ICD. Flowsheet Row Cardiac Rehab from 08/25/2016 in [facility] Cardiac and Pulmonary Rehab  Date  07/21/16  Educator  CE  Instruction Review Code  2- meets goals/outcomes      Cardiac Procedures: - Group verbal and written instruction and models to describe the testing methods done to diagnose heart disease. Reviews the outcomes of the test results. Describes the treatment choices: Medical Management, Angioplasty, or Coronary Bypass Surgery. Flowsheet Row Cardiac Rehab from 08/25/2016 in [facility] Cardiac and Pulmonary Rehab  Date  07/28/16  Educator  [place]  Instruction Review Code  2- meets goals/outcomes      Cardiac Medications: - Group verbal and written instruction to review commonly prescribed medications for heart disease. Reviews the medication, class of the drug, and  side effects. Includes the steps to properly store meds and maintain the prescription regimen. Flowsheet Row Cardiac Rehab from 08/25/2016 in [facility] Cardiac and Pulmonary Rehab  Date  08/06/16  Educator  SB  Instruction Review Code  2- meets goals/outcomes      Go Sex-Intimacy & Heart Disease, Get SMART - Goal Setting: - Group verbal and written instruction through game format to discuss heart disease and the return to sexual intimacy. Provides group verbal and written material to discuss and apply goal setting through the application of the S.M.A.R.T. Method. Flowsheet Row Cardiac Rehab from 08/25/2016 in [facility] Cardiac and Pulmonary Rehab  Date  07/28/16  Educator  [place]  Instruction Review Code  2- meets goals/outcomes      Other Matters of the Heart: - Provides group verbal, written materials and models to describe Heart Failure, Angina, Valve Disease, and Diabetes in the realm of heart disease. Includes description of the disease process and treatment options available to the cardiac patient. Flowsheet Row Cardiac Rehab from 08/25/2016 in [facility] Cardiac and Pulmonary Rehab  Date  07/21/16  Educator  CE  Instruction Review Code  2- meets goals/outcomes      Exercise & Equipment Safety: - Individual verbal instruction and demonstration of equipment use and safety with use of the equipment. Flowsheet Row Cardiac Rehab from 08/25/2016 in [facility] Cardiac and Pulmonary Rehab  Date  07/03/16  Educator  C. Enterkin,RN  Instruction Review Code  1- partially meets, needs review/practice      Infection Prevention: - Provides verbal and written material to individual with discussion of infection control including proper hand washing and proper equipment cleaning during exercise session. Flowsheet Row Cardiac Rehab from 08/25/2016 in [facility] Cardiac and Pulmonary Rehab  Date  07/03/16  Educator  C. EnterkinRN  Instruction Review Code  2- meets goals/outcomes      Falls Prevention: - Provides verbal  and written material to individual with discussion of falls prevention and safety. Flowsheet Row Cardiac Rehab from 08/25/2016 in [facility] Cardiac and Pulmonary Rehab  Date  07/03/16  Educator  C. Enterkin,RN  Instruction Review Code  2- meets goals/outcomes      Diabetes: - Individual verbal and written instruction to review  signs/symptoms of diabetes, desired ranges of glucose level fasting, after meals and with exercise. Advice that pre and post exercise glucose checks will be done for 3 sessions at entry of program.    Knowledge Questionnaire Score:     Knowledge Questionnaire Score - 08/27/16 1812      Knowledge Questionnaire Score   Post Score 27      Core Components/Risk Factors/Patient Goals at Admission:     Personal Goals and Risk Factors at Admission - 07/03/16 1352      Core Components/Risk Factors/Patient Goals on Admission    Weight Management Yes   Intervention Weight Management: Provide education and appropriate resources to help participant work on and attain dietary goals.   Admit Weight 242 lb (109.8 kg)   Goal Weight: Short Term 232 lb (105.2 kg)   Goal Weight: Long Term 205 lb (93 kg)   Expected Outcomes Short Term: Continue to assess and modify interventions until short term weight is achieved;Weight Maintenance: Understanding of the daily nutrition guidelines, which includes 25-35% calories from fat, 7% or less cal from saturated fats, less than '200mg'$  cholesterol, less than 1.5gm of sodium, & 5 or more servings of fruits and vegetables daily;Weight Loss: Understanding of general recommendations for a balanced deficit meal plan, which promotes 1-2 lb weight loss per week and includes a negative energy balance of [id number] kcal/d;Understanding recommendations for meals to include 15-35% energy as protein, 25-35% energy from fat, 35-60% energy from carbohydrates, less than '200mg'$  of dietary cholesterol, 20-35 gm of total fiber daily;Understanding of distribution of  calorie intake throughout the day with the consumption of 4-5 meals/snacks;Weight Gain: Understanding of general recommendations for a high calorie, high protein meal plan that promotes weight gain by distributing calorie intake throughout the day with the consumption for 4-5 meals, snacks, and/or supplements   Increase Strength and Stamina Yes   Intervention Provide advice, education, support and counseling about physical activity/exercise needs.;Develop an individualized exercise prescription for aerobic and resistive training based on initial evaluation findings, risk stratification, comorbidities and participant's personal goals.   Expected Outcomes Achievement of increased cardiorespiratory fitness and enhanced flexibility, muscular endurance and strength shown through measurements of functional capacity and personal statement of participant.   Lipids Yes   Intervention Provide education and support for participant on nutrition & aerobic/resistive exercise along with prescribed medications to achieve LDL '70mg'$ , HDL >'40mg'$ .   Expected Outcomes Short Term: Participant states understanding of desired cholesterol values and is compliant with medications prescribed. Participant is following exercise prescription and nutrition guidelines.;Long Term: Cholesterol controlled with medications as prescribed, with individualized exercise RX and with personalized nutrition plan. Value goals: LDL < '70mg'$ , HDL > 40 mg.      Core Components/Risk Factors/Patient Goals Review:      Goals and Risk Factor Review    Row Name 07/14/16 1805 07/24/16 1040 08/25/16 1748         Core Components/Risk Factors/Patient Goals Review   Personal Goals Review Sedentary;Hypertension;Lipids Weight Management/Obesity;Stress;Hypertension Lipids     Review Matthew Doyle is in his second week of Heart Track - hewas tired after the first week.  His BP measurements have been good but his cholesterol hasnt been measured recently.  His wife does  the cooking and met with the RD to learn healthier techniques. Matthew Doyle's wife met with the RD and has changed what she cooks to the recommendations from the RD.  Matthew Doyle has lost 5 lb.  He is not working now so stress levels are low. Matthew Doyle  has loss weight since starting the program. He is down to about 228 from 242. He has accomplished this by decreasing fat intake,exercising as prescribed.  He goes to gym on none hearttrack days.     Expected Outcomes Matthew Doyle will continue to improve strength and stamina with regular exercise and see improved measures for BP and cholesterol. Matthew Doyle will continue to exercise and monitor his vitals.  He will see an overall improvement in health. Matthew Doyle will continue to exercise and monitor his vitals.  He will see an overall improvement in health.        Core Components/Risk Factors/Patient Goals at Discharge (Final Review):      Goals and Risk Factor Review - 08/25/16 1748      Core Components/Risk Factors/Patient Goals Review   Personal Goals Review Lipids   Review Giulian has loss weight since starting the program. He is down to about 228 from 242. He has accomplished this by decreasing fat intake,exercising as prescribed.  He goes to gym on none hearttrack days.   Expected Outcomes Matthew Doyle will continue to exercise and monitor his vitals.  He will see an overall improvement in health.      ITP Comments:     ITP Comments    Row Name 07/03/16 1348 07/03/16 1351 07/03/16 1354 07/16/16 0847 07/25/16 1021   ITP Comments Matthew Doyle's left shoulder fell off a bus years ago and landed on his shoulder so Matthew Doyle can't lift it up over his head. Matthew Doyle reports  "both of my knees are bad". Matthew Doyle reports he has 6 months of PTO time so is getting paid for being off work. He also said he does not have a copay for Cardiac Rehab since he has met his $5000 deducttible. 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  Matthew Doyle said his wife fixes all his meals. He usually takes left overs for lunch to work.  I emphasized the importance of Matthew Doyle to carry his NTG sl. I showed him a metal pill container that I carry on my key ring. Matthew Doyle said he had a prescription for NTG after his first heart attack but was not carrying it when he had this last heart attack.  30 day review. Continue with ITP unless changes noted by Medical Director at signature of review. Matthew Doyle has done well with exercise and plans to [facility] the [facility]   Stacy Name 08/07/16 1153 08/13/16 0714 08/28/16 1748       ITP Comments Matthew Doyle continues to progress well with exercise. 30 day review. Continue with ITP unless changes noted by Medical Director at signature of review. Discharged.        Comments:

## 2016-09-05 ENCOUNTER — Encounter: Payer: Self-pay | Admitting: Radiology

## 2016-09-09 ENCOUNTER — Ambulatory Visit
Admission: RE | Admit: 2016-09-09 | Discharge: 2016-09-09 | Disposition: A | Discharge status: Home / Self Care | Payer: BC Managed Care – PPO | Source: Ambulatory Visit | Attending: Neurology | Admitting: Neurology

## 2016-09-09 DIAGNOSIS — R29898 Other symptoms and signs involving the musculoskeletal system: Secondary | ICD-10-CM | POA: Diagnosis present

## 2016-09-09 DIAGNOSIS — R202 Paresthesia of skin: Secondary | ICD-10-CM | POA: Diagnosis present

## 2016-09-09 DIAGNOSIS — M2578 Osteophyte, vertebrae: Secondary | ICD-10-CM | POA: Insufficient documentation

## 2016-09-09 DIAGNOSIS — M5021 Other cervical disc displacement,  high cervical region: Secondary | ICD-10-CM | POA: Diagnosis not present

## 2016-09-30 ENCOUNTER — Ambulatory Visit (INDEPENDENT_AMBULATORY_CARE_PROVIDER_SITE_OTHER): Payer: BC Managed Care – PPO | Admitting: Cardiovascular Disease

## 2016-09-30 ENCOUNTER — Encounter: Payer: Self-pay | Admitting: Cardiovascular Disease

## 2016-09-30 VITALS — BP 104/70 | HR 59 | Ht 71.0 in | Wt 215.5 lb

## 2016-09-30 DIAGNOSIS — E7800 Pure hypercholesterolemia, unspecified: Secondary | ICD-10-CM | POA: Diagnosis not present

## 2016-09-30 DIAGNOSIS — I2510 Atherosclerotic heart disease of native coronary artery without angina pectoris: Secondary | ICD-10-CM

## 2016-09-30 DIAGNOSIS — Z01810 Encounter for preprocedural cardiovascular examination: Secondary | ICD-10-CM

## 2016-09-30 DIAGNOSIS — I10 Essential (primary) hypertension: Secondary | ICD-10-CM

## 2016-09-30 NOTE — Progress Notes (Signed)
Cardiology Office Note   Date:  09/30/2016   ID:  Matthew, Doyle 04/22/1956, MRN [id number]  PCP:  Matthew Simpler, MD  Cardiologist:   Matthew Sacramento, MD   Chief Complaint  Patient presents with  . other    3 month follow up. Meds reviewed by the pt. verbally. "doing well."       History of Present Illness: Matthew Doyle is a 60 y.o. male who presents for a followup visit regarding coronary artery disease.  He is s/p angioplasty and drug-eluting stent placement to the LAD in 2004 and PCI And drug-eluting stent placement to OM 2 in January 2014 after a small non-ST elevation myocardial infarction. Cardiac catheterization at that time showed patent LAD stent, occluded right PDA distally supplying a small territory and 99% proximal OM 2 stenosis .  Ejection fraction was normal. He presented on July 21 with anterolateral ST elevation myocardial infarction complicated by ventricular fibrillation. He was shocked once with restoration of sinus rhythm and underwent emergent cardiac catheterization which showed subtotal thrombotic occlusion of proximal LAD at the diagonal bifurcation and also thrombus in the mid left circumflex. He underwent aspiration thrombectomy of the diagonal and left circumflex and drug-eluting stent placement to the proximal LAD. LV gram showed an ejection fraction of 45% but subsequent echo showed normal LV systolic function. He has been doing well from a cardiac standpoint with no chest pain or shortness of breath. He attended cardiac rehabilitation with no limitations. His biggest issue has been severe left neck and left arm pain. He was seen by Dr. Cari Caraway at [facility] and was diagnosed with cervical myelopathy. He was scheduled for surgery to be done next month.   Past Medical History:  Diagnosis Date  . Arthritis    knee  . Atrial fibrillation ([place]) post MI   . CAD (coronary artery disease)    a. 09/2003 Cath/PCI: LAD 80%->3.0x23 Cypher DES;  b. 03/2004  Cath ;  c. NSTEMI 12/2012 cath patent LAD stent, included distal right PDA supplying a small territory, 99% proximal OM 2 status post PCI and DES placement  d. cath 06/20/2016 anterolateral STEMI stent to LAD, PCTA to diag and LCx  . Colon polyps   . Diverticulosis   . Dysrhythmia   . Gout   . Hemorrhoids   . Hyperlipidemia   . Hypertension   . Internal hemorrhoids without mention of complication   . Myocardial infarction    LAST 12/16/12  . ST elevation (STEMI) myocardial infarction involving left anterior descending coronary artery ([place]) 06/20/2016    Past Surgical History:  Procedure Laterality Date  . CARDIAC CATHETERIZATION  2014   s/p stent  . CARDIAC CATHETERIZATION N/A 06/20/2016   Procedure: Left Heart Cath and Coronary Angiography;  Surgeon: Matthew Mocha, MD;  Location: [place] [facility];  Service: Cardiovascular;  Laterality: N/A;  . CARDIAC CATHETERIZATION N/A 06/20/2016   Procedure: Coronary Stent Intervention;  Surgeon: Matthew Mocha, MD;  Location: [place] [facility];  Service: Cardiovascular;  Laterality: N/A;  . CHOLECYSTECTOMY N/A 03/13/2016   Procedure: LAPAROSCOPIC CHOLECYSTECTOMY WITH INTRAOPERATIVE CHOLANGIOGRAM;  Surgeon: Matthew Lye, MD;  Location: [facility] ORS;  Service: General;  Laterality: N/A;  . CHOLECYSTECTOMY, LAPAROSCOPIC  03/13/16   Infection - lengthy diagnosis d/t lack of gallstones  . COLONOSCOPY  10/2013  . CORONARY ANGIOPLASTY     STENTS  . Cypher Stent  10/04   mid LAD- gupta  . LEFT HEART CATHETERIZATION WITH CORONARY ANGIOGRAM N/A  12/17/2012   Procedure: LEFT HEART CATHETERIZATION WITH CORONARY ANGIOGRAM;  Surgeon: Matthew M Martinique, MD;  Location: [facility] CATH LAB;  Service: Cardiovascular;  Laterality: N/A;  . PERCUTANEOUS CORONARY STENT INTERVENTION (PCI-S) Right 12/17/2012   Procedure: PERCUTANEOUS CORONARY STENT INTERVENTION (PCI-S);  Surgeon: Matthew M Martinique, MD;  Location: [facility] CATH LAB;  Service: Cardiovascular;  Laterality: Right;      Current Outpatient Prescriptions  Medication Sig Dispense Refill  . aspirin 81 MG tablet Take 1 tablet (81 mg total) by mouth daily. 30 tablet   . atorvastatin (LIPITOR) 80 MG tablet Take 1 tablet (80 mg total) by mouth daily at 6 PM. 90 tablet 3  . ezetimibe (ZETIA) 10 MG tablet Take 1 tablet (10 mg total) by mouth daily. 90 tablet 3  . gabapentin (NEURONTIN) 300 MG capsule Take 300 mg by mouth at bedtime.    Matthew Doyle losartan (COZAAR) 100 MG tablet TAKE ONE TABLET BY MOUTH ONCE DAILY 90 tablet 3  . nitroGLYCERIN (NITROSTAT) 0.4 MG SL tablet Place 1 tablet (0.4 mg total) under the tongue every 5 (five) minutes as needed for chest pain. 25 tablet 3  . ticagrelor (BRILINTA) 90 MG TABS tablet Take 1 tablet (90 mg total) by mouth 2 (two) times daily. 180 tablet 3   No current facility-administered medications for this visit.     Allergies:   Lisinopril    Social History:  The patient  reports that he has never smoked. He has never used smokeless tobacco. He reports that he does not drink alcohol or use drugs.   Family History:  The patient's family history includes Alzheimer's disease in his maternal grandmother; Arrhythmia in his brother; Coronary artery disease in his father and maternal grandfather; Diabetes in his father; Heart attack in his maternal grandfather, maternal uncle, and paternal uncle; Hypertension in his brother, brother, and mother; Throat cancer in his maternal grandfather.    ROS:  Please see the history of present illness.   Otherwise, review of systems are positive for none.   All other systems are reviewed and negative.    PHYSICAL EXAM: VS:  BP 104/70 (BP Location: Left Arm, Patient Position: Sitting, Cuff Size: Normal)   Pulse (!) 59   Ht 5\' 11"  (1.803 m)   Wt 215 lb 8 oz (97.8 kg)   BMI 30.06 kg/m  , BMI Body mass index is 30.06 kg/m. GEN: Well nourished, well developed, in no acute distress HEENT: normal Neck: no JVD, carotid bruits, or masses Cardiac:  RRR; no murmurs, rubs, or gallops,no edema  Respiratory:  clear to auscultation bilaterally, normal work of breathing GI: soft, nontender, nondistended, + BS MS: no deformity or atrophy Skin: warm and dry, no rash Neuro:  Strength and sensation are intact Psych: euthymic mood, full affect   EKG:  EKG is ordered today. EKG showed sinus bradycardia with no significant ST or T wave changes.   Recent Labs: 06/20/2016: ALT 28; B Natriuretic Peptide 40.8; TSH 1.443 06/23/2016: BUN 19; Creatinine, Ser 1.08; Hemoglobin 13.5; Platelets 203; Potassium 3.9; Sodium 137    Lipid Panel    Component Value Date/Time   CHOL 120 06/20/2016 1938   CHOL 91 (L) 03/31/2013 0914   TRIG 88 06/20/2016 1938   HDL 32 (L) 06/20/2016 1938   HDL 36 (L) 03/31/2013 0914   CHOLHDL 3.8 06/20/2016 1938   VLDL 18 06/20/2016 1938   LDLCALC 70 06/20/2016 1938   LDLCALC 47 03/31/2013 0914      Wt Readings from Last  3 Encounters:  09/30/16 215 lb 8 oz (97.8 kg)  07/30/16 232 lb (105.2 kg)  07/03/16 242 lb 1.6 oz (109.8 kg)       ASSESSMENT AND PLAN:  1.   coronary artery disease involving native coronary arteries without angina: He is doing well overall with no anginal symptoms. Continue medical therapy. The plan is to keep him on lifelong dual antiplatelet therapy given his recurrent myocardial infarctions.   2. Essential hypertension: Blood pressure is  well controlled on current medications.  3. Hyperlipidemia: Continue high dose atorvastatin and Zetia. Most recent LDL was 70.  4. Preoperative cardiovascular evaluation before cervical spine surgery: Given that he had myocardial infarction and drug-eluting stent placement in July, the surgery should be postponed 6 months if it is not urgent. He reports that his neck and arm pain is severe and he might not be able to wait until January 21. Postponing surgery at least until December might be better. Interrupting dual antiplatelet therapy will be associated  with increased risk of stent thrombosis. This was explained to the patient and he seems to be willing to take the small chance of that. Bridging with IV antiplatelet or antithrombotic medications has not been proven to decrease cardiovascular events in this time frame. He does not require any stress testing before the planned surgery. If he is to proceed with surgery, then Brilinta can be held 5 days before the surgery and should be resumed as soon as safe from a bleeding standpoint. Aspirin 81 mg once daily should be continued.  Disposition:   FU with me in 6 months  Signed,  Matthew Sacramento, MD  09/30/2016 5:51 PM    [place]

## 2016-09-30 NOTE — Patient Instructions (Signed)
Medication Instructions: Continue same medications.   Labwork: None.   Procedures/Testing: None.   Follow-Up: 6 months with Dr. Fletcher Anon  Any Additional Special Instructions Will Be Listed Below (If Applicable).  I will communicate with your surgeon regarding surgery.    If you need a refill on your cardiac medications before your next appointment, please call your pharmacy.

## 2016-10-20 HISTORY — PX: BACK SURGERY: SHX140

## 2016-12-29 ENCOUNTER — Ambulatory Visit: Payer: Self-pay | Admitting: Internal Medicine

## 2017-03-12 ENCOUNTER — Ambulatory Visit: Payer: BC Managed Care – PPO | Attending: Neurology

## 2017-03-12 DIAGNOSIS — G4733 Obstructive sleep apnea (adult) (pediatric): Secondary | ICD-10-CM | POA: Insufficient documentation

## 2017-03-12 DIAGNOSIS — R0683 Snoring: Secondary | ICD-10-CM | POA: Diagnosis not present

## 2017-03-26 ENCOUNTER — Telehealth: Payer: Self-pay | Admitting: Cardiovascular Disease

## 2017-03-26 NOTE — Telephone Encounter (Signed)
Pt spouse called, states pt has been experiencing dizziness for about several months. States pt neurologist thinks he has parkinsons disease. Pt states this morning the dizziness is worse. States BP this morning at 5:30 am is 144/83 HR 55.  108/66 HR 49 at 7:30am 8am laying down was 140/84 HR 55 8 am sitting 149/93 HR 56 8 am statnding 131/89 HR 62 A few minutes ago HR was 52 (last 15 minutes) After 1:30 please call cell [phone]

## 2017-03-26 NOTE — Telephone Encounter (Signed)
Returned call to patient's wife, ok per DPR. We reviewed the BP and HR readings as listed in previous entry and they are consistent with past office visits. Patient has been treated for ongoing dizziness by Dr Manuella Ghazi in relation to Parkinson's Disease. Patient is on Requip prescribed by Dr Manuella Ghazi at [facility]. Patient stated his dizziness was worse this morning and he had to call out of work. Patient denies chest pain and SOB or slurred speech. Patient has some known weakness in extremities and has tremors. Advised her to call Dr Trena Platt office for advice since they are treating the dizziness and it has been an ongoing issue possibly due to Parkinson's. Patient already has appt with Dr Fletcher Anon on 04/02/17 and will keep this appt. Next appt with Dr Manuella Ghazi is 04/07/17 according to wife. She verbalized understanding to call 911 or go to the ER if he develops new or worsening symptoms. Will route to Dr Fletcher Anon for review or additional advice if warranted.

## 2017-03-26 NOTE — Telephone Encounter (Signed)
We should check his orthostatic vitals when he comes for office visit.

## 2017-03-26 NOTE — Telephone Encounter (Signed)
Added note under patient's appt notes that he will need orthostatic BP's that day.

## 2017-04-02 ENCOUNTER — Encounter: Payer: Self-pay | Admitting: Cardiovascular Disease

## 2017-04-02 ENCOUNTER — Ambulatory Visit (INDEPENDENT_AMBULATORY_CARE_PROVIDER_SITE_OTHER): Payer: BC Managed Care – PPO | Admitting: Cardiovascular Disease

## 2017-04-02 VITALS — BP 126/80 | HR 53 | Ht 71.5 in | Wt 219.5 lb

## 2017-04-02 DIAGNOSIS — I2510 Atherosclerotic heart disease of native coronary artery without angina pectoris: Secondary | ICD-10-CM

## 2017-04-02 DIAGNOSIS — E7800 Pure hypercholesterolemia, unspecified: Secondary | ICD-10-CM

## 2017-04-02 DIAGNOSIS — R42 Dizziness and giddiness: Secondary | ICD-10-CM | POA: Diagnosis not present

## 2017-04-02 DIAGNOSIS — I10 Essential (primary) hypertension: Secondary | ICD-10-CM | POA: Diagnosis not present

## 2017-04-02 MED ORDER — LOSARTAN POTASSIUM 50 MG PO TABS: 50.0000 mg | ORAL_TABLET | Freq: Every day | ORAL | 3 refills | Status: DC

## 2017-04-02 NOTE — Patient Instructions (Signed)
Medication Instructions:  Your physician has recommended you make the following change in your medication:  1- DECREASE Losartan to 50 mg by mouth once a day.   Labwork: none  Testing/Procedures: none  Follow-Up: Your physician wants you to follow-up in: [place]. You will receive a reminder letter in the mail two months in advance. If you don't receive a letter, please call our office to schedule the follow-up appointment.   If you need a refill on your cardiac medications before your next appointment, please call your pharmacy.

## 2017-04-02 NOTE — Progress Notes (Signed)
Cardiology Office Note   Date:  04/02/2017   ID:  Matthew, Doyle [birth year], MRN [id number]  PCP:  Viviana Simpler, MD  Cardiologist:   [name], MD   Chief Complaint  Patient presents with  . Follow-up    56mo f/u. experiencing dizziness; denies chest pain, SOB, swelling or palpitations. Reviewed meds with pt verbally.      History of Present Illness: Matthew Doyle is a 61 y.o. male who presents for a followup visit regarding coronary artery disease.  He is s/p angioplasty and drug-eluting stent placement to the LAD in 2004 and PCI and drug-eluting stent placement to OM 2 in January 2014 after a small non-ST elevation myocardial infarction. Cardiac catheterization at that time showed patent LAD stent, occluded right PDA distally supplying a small territory and 99% proximal OM 2 stenosis .  Ejection fraction was normal. He presented in July, 2017 with anterolateral ST elevation myocardial infarction complicated by ventricular fibrillation. He was shocked once with restoration of sinus rhythm and underwent emergent cardiac catheterization which showed subtotal thrombotic occlusion of proximal LAD at the diagonal bifurcation and also thrombus in the mid left circumflex. He underwent aspiration thrombectomy of the diagonal and left circumflex and drug-eluting stent placement to the proximal LAD. LV gram showed an ejection fraction of 45% but subsequent echo showed normal LV systolic function.  He has cervical myelopathy and underwent cervical spine surgery in November 2017. The patient has been having tremors and has features suggestive of parkinsonisms. He is being treated in neurology.  He has been doing well from a cardiac standpoint with no chest pain, shortness of breath or palpitations. His biggest issue is orthostatic dizziness. He has been checking blood pressure at home and he has been getting relatively low readings in the 782 range for systolic pressure.   Past  Medical History:  Diagnosis Date  . Arthritis    knee  . Atrial fibrillation ([place]) post MI   . CAD (coronary artery disease)    a. 09/2003 Cath/PCI: LAD 80%->3.0x23 Cypher DES;  b. 03/2004 Cath ;  c. NSTEMI 12/2012 cath patent LAD stent, included distal right PDA supplying a small territory, 99% proximal OM 2 status post PCI and DES placement  d. cath 06/20/2016 anterolateral STEMI stent to LAD, PCTA to diag and LCx  . Colon polyps   . Diverticulosis   . Dysrhythmia   . Gout   . Hemorrhoids   . Hyperlipidemia   . Hypertension   . Internal hemorrhoids without mention of complication   . Myocardial infarction ([place])    LAST 12/16/12  . ST elevation (STEMI) myocardial infarction involving left anterior descending coronary artery ([place]) 06/20/2016    Past Surgical History:  Procedure Laterality Date  . BACK SURGERY  10/20/2016   Dr.Chester Johnnette Gourd  . CARDIAC CATHETERIZATION  2014   s/p stent  . CARDIAC CATHETERIZATION N/A 06/20/2016   Procedure: Left Heart Cath and Coronary Angiography;  Surgeon: Sherren Mocha, MD;  Location: [place] [facility];  Service: Cardiovascular;  Laterality: N/A;  . CARDIAC CATHETERIZATION N/A 06/20/2016   Procedure: Coronary Stent Intervention;  Surgeon: Sherren Mocha, MD;  Location: [place] [facility];  Service: Cardiovascular;  Laterality: N/A;  . CHOLECYSTECTOMY N/A 03/13/2016   Procedure: LAPAROSCOPIC CHOLECYSTECTOMY WITH INTRAOPERATIVE CHOLANGIOGRAM;  Surgeon: Christene Lye, MD;  Location: [facility] ORS;  Service: General;  Laterality: N/A;  . CHOLECYSTECTOMY, LAPAROSCOPIC  03/13/16   Infection - lengthy diagnosis d/t lack of gallstones  .  COLONOSCOPY  10/2013  . CORONARY ANGIOPLASTY     STENTS  . Cypher Stent  10/04   mid LAD- gupta  . LEFT HEART CATHETERIZATION WITH CORONARY ANGIOGRAM N/A 12/17/2012   Procedure: LEFT HEART CATHETERIZATION WITH CORONARY ANGIOGRAM;  Surgeon: Peter M Martinique, MD;  Location: [facility] CATH LAB;  Service: Cardiovascular;   Laterality: N/A;  . PERCUTANEOUS CORONARY STENT INTERVENTION (PCI-S) Right 12/17/2012   Procedure: PERCUTANEOUS CORONARY STENT INTERVENTION (PCI-S);  Surgeon: Peter M Martinique, MD;  Location: [facility] CATH LAB;  Service: Cardiovascular;  Laterality: Right;     Current Outpatient Prescriptions  Medication Sig Dispense Refill  . aspirin 81 MG tablet Take 1 tablet (81 mg total) by mouth daily. 30 tablet   . atorvastatin (LIPITOR) 80 MG tablet Take 1 tablet (80 mg total) by mouth daily at 6 PM. 90 tablet 3  . ezetimibe (ZETIA) 10 MG tablet Take 1 tablet (10 mg total) by mouth daily. 90 tablet 3  . gabapentin (NEURONTIN) 300 MG capsule Take 600 mg by mouth 2 (two) times daily.     Marland [employer] losartan (COZAAR) 100 MG tablet TAKE ONE TABLET BY MOUTH ONCE DAILY 90 tablet 3  . nitroGLYCERIN (NITROSTAT) 0.4 MG SL tablet Place 1 tablet (0.4 mg total) under the tongue every 5 (five) minutes as needed for chest pain. 25 tablet 3  . rOPINIRole (REQUIP) 4 MG tablet Take 4 mg by mouth at bedtime.    . ticagrelor (BRILINTA) 90 MG TABS tablet Take 1 tablet (90 mg total) by mouth 2 (two) times daily. 180 tablet 3   No current facility-administered medications for this visit.     Allergies:   Lisinopril    Social History:  The patient  reports that he has never smoked. He has never used smokeless tobacco. He reports that he does not drink alcohol or use drugs.   Family History:  The patient's family history includes Alzheimer's disease in his maternal grandmother; Arrhythmia in his brother; Coronary artery disease in his father and maternal grandfather; Diabetes in his father; Heart attack in his maternal grandfather, maternal uncle, and paternal uncle; Hypertension in his brother, brother, and mother; Throat cancer in his maternal grandfather.    ROS:  Please see the history of present illness.   Otherwise, review of systems are positive for none.   All other systems are reviewed and negative.    PHYSICAL EXAM: VS:  BP  126/80 (BP Location: Left Arm, Patient Position: Sitting, Cuff Size: Normal)   Pulse (!) 53   Ht 5' 11.5" (1.816 m)   Wt 219 lb 8 oz (99.6 kg)   BMI 30.19 kg/m  , BMI Body mass index is 30.19 kg/m. GEN: Well nourished, well developed, in no acute distress  HEENT: normal  Neck: no JVD, carotid bruits, or masses Cardiac: RRR; no murmurs, rubs, or gallops,no edema  Respiratory:  clear to auscultation bilaterally, normal work of breathing GI: soft, nontender, nondistended, + BS MS: no deformity or atrophy  Skin: warm and dry, no rash Neuro:  Strength and sensation are intact Psych: euthymic mood, full affect   EKG:  EKG is ordered today. EKG showed sinus bradycardia with no significant ST or T wave changes.   Recent Labs: 06/20/2016: ALT 28; B Natriuretic Peptide 40.8; TSH 1.443 06/23/2016: BUN 19; Creatinine, Ser 1.08; Hemoglobin 13.5; Platelets 203; Potassium 3.9; Sodium 137    Lipid Panel    Component Value Date/Time   CHOL 120 06/20/2016 1938   CHOL 91 (L) 03/31/2013  0914   TRIG 88 06/20/2016 1938   HDL 32 (L) 06/20/2016 1938   HDL 36 (L) 03/31/2013 0914   CHOLHDL 3.8 06/20/2016 1938   VLDL 18 06/20/2016 1938   LDLCALC 70 06/20/2016 1938   LDLCALC 47 03/31/2013 0914      Wt Readings from Last 3 Encounters:  04/02/17 219 lb 8 oz (99.6 kg)  09/30/16 215 lb 8 oz (97.8 kg)  07/30/16 232 lb (105.2 kg)       ASSESSMENT AND PLAN:  1.   coronary artery disease involving native coronary arteries without angina: He is doing well overall with no anginal symptoms. Continue medical therapy. The plan is to keep him on lifelong dual antiplatelet therapy given his recurrent myocardial infarctions. I will consider switching him to clopidogrel after July.   2. Essential hypertension: Blood pressure is  well controlled on current medications. However, he has been having orthostatic dizziness and occasional low blood pressure readings. Thus, I elected to decrease losartan to 50 mg  once daily. Some of his dizziness might be due to possible Parkinson's. He is not orthostatic today by exam. He is mildly bradycardic. Avoid any medications that can cause bradycardia.  3. Hyperlipidemia: Continue high dose atorvastatin and Zetia. Most recent LDL was 70.  4. Recent diagnosis of sleep apnea: He would be getting CPAP titration.  Disposition:   FU with me in 6 months  Signed,  [name], MD  04/02/2017 9:13 AM    [place]

## 2017-04-21 ENCOUNTER — Ambulatory Visit: Payer: BC Managed Care – PPO | Attending: Neurology

## 2017-04-21 DIAGNOSIS — G4733 Obstructive sleep apnea (adult) (pediatric): Secondary | ICD-10-CM | POA: Insufficient documentation

## 2017-04-21 DIAGNOSIS — Z7982 Long term (current) use of aspirin: Secondary | ICD-10-CM | POA: Diagnosis not present

## 2017-04-21 DIAGNOSIS — I10 Essential (primary) hypertension: Secondary | ICD-10-CM | POA: Insufficient documentation

## 2017-04-21 DIAGNOSIS — Z79899 Other long term (current) drug therapy: Secondary | ICD-10-CM | POA: Insufficient documentation

## 2017-04-21 DIAGNOSIS — I2510 Atherosclerotic heart disease of native coronary artery without angina pectoris: Secondary | ICD-10-CM | POA: Insufficient documentation

## 2017-04-21 DIAGNOSIS — R0683 Snoring: Secondary | ICD-10-CM | POA: Diagnosis not present

## 2017-06-01 ENCOUNTER — Telehealth: Payer: Self-pay | Admitting: Cardiovascular Disease

## 2017-06-01 ENCOUNTER — Other Ambulatory Visit: Payer: Self-pay | Admitting: *Deleted

## 2017-06-01 MED ORDER — TICAGRELOR 90 MG PO TABS: 90.0000 mg | ORAL_TABLET | Freq: Two times a day (BID) | ORAL | 3 refills | Status: DC

## 2017-06-01 NOTE — Telephone Encounter (Signed)
Pt wife calling asking if we can send in a 90 day supply on patient on Brilinta  Please send to [facility] in [place]    Please advise

## 2017-06-01 NOTE — Telephone Encounter (Signed)
Brilinta 90 mg tablet sent to local pharmacy [facility] pharmacy.

## 2017-06-11 ENCOUNTER — Other Ambulatory Visit (HOSPITAL_COMMUNITY): Payer: Self-pay | Admitting: Nurse Practitioner

## 2017-06-11 DIAGNOSIS — G20 Parkinson's disease: Secondary | ICD-10-CM

## 2017-06-12 IMAGING — NM NM HEPATO W/GB/PHARM/[PERSON_NAME]
2 series · 12 of 12 positions shown · non-contrast
Comparison: 11/04/2015 CT and ultrasound.

CLINICAL DATA: 59-year-old male with right upper quadrant pain for
3-4 months. Subsequent encounter.

EXAM:
NUCLEAR MEDICINE HEPATOBILIARY IMAGING WITH GALLBLADDER EF
TECHNIQUE: Sequential images of the abdomen were obtained [DATE] minutes
following intravenous administration of radiopharmaceutical. After
slow intravenous infusion of 2.26 micrograms Cholecystokinin,
gallbladder ejection fraction was determined.
RADIOPHARMACEUTICALS:  5.27 mCi Uc-11m Choletec IV

[Series 1000: gallbladder ef · 4.80mm/px · 6 of 120 frames shown]
[frame 11/120]
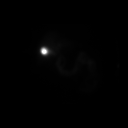
[frame 31/120]
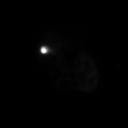
[frame 51/120]
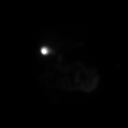
[frame 71/120]
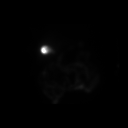
[frame 91/120]
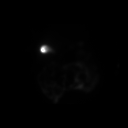
[frame 111/120]
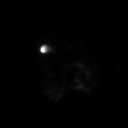

[Series 1000: hepatobiliary scan · 9.59mm/px · 6 of 60 frames shown]
[frame 6/60]
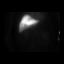
[frame 16/60]
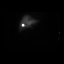
[frame 26/60]
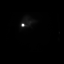
[frame 36/60]
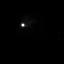
[frame 46/60]
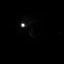
[frame 56/60]
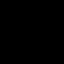

[12 of 12 positions shown; findings below may reference images not displayed]

FINDINGS: Prompt uptake and biliary excretion of activity by the liver is
seen. Gallbladder activity is visualized by 10 minutes, consistent
with patency of cystic duct. Biliary activity passes into small
bowel by 40 minutes, consistent with patent common bile duct.

Calculated gallbladder ejection fraction is 9%%. (At 60 min, normal
ejection fraction is greater than 40%.)

The patient did not experience pain during the infusion of Kinevac.
IMPRESSION: Decreased gallbladder ejection fraction of 9%.

## 2017-06-22 IMAGING — DX DG CHEST 1V PORT
1 series · 1 of 1 positions shown · non-contrast
Comparison: 11/04/2015

CLINICAL DATA: Right anterior chest pain radiating through to the
back. Dyspnea.

EXAM:
PORTABLE CHEST 1 VIEW

[chest ap]
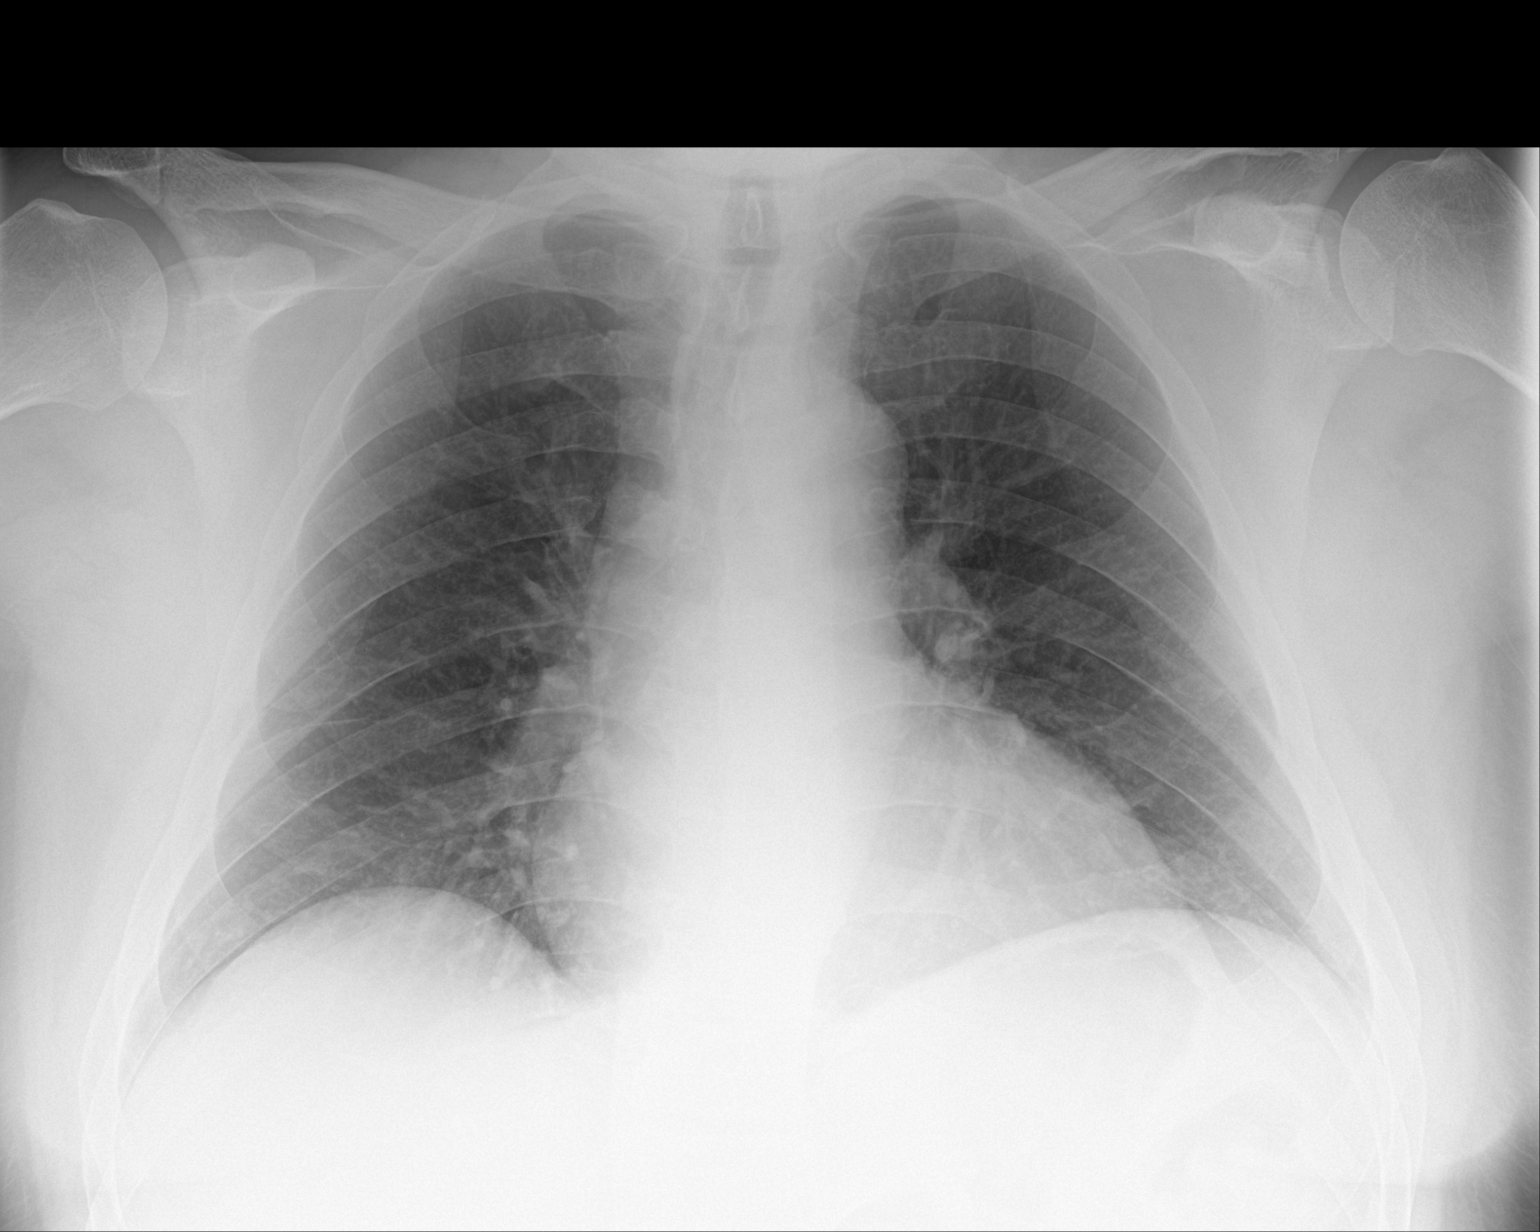

[1 of 1 positions shown; findings below may reference images not displayed]

FINDINGS: A single AP portable view of the chest demonstrates no focal
airspace consolidation or alveolar edema. The lungs are grossly
clear. There is no large effusion or pneumothorax. Cardiac and
mediastinal contours appear unremarkable.
IMPRESSION: No active disease.

## 2017-06-26 ENCOUNTER — Other Ambulatory Visit: Payer: Self-pay

## 2017-06-26 MED ORDER — EZETIMIBE 10 MG PO TABS: 10.0000 mg | ORAL_TABLET | Freq: Every day | ORAL | 3 refills | Status: DC

## 2017-06-26 NOTE — Telephone Encounter (Signed)
Requested Prescriptions   Signed Prescriptions Disp Refills  . ezetimibe (ZETIA) 10 MG tablet 90 tablet 3    Sig: Take 1 tablet (10 mg total) by mouth daily.    Authorizing Provider: [name] A    Ordering User: Janan Ridge

## 2017-07-09 ENCOUNTER — Ambulatory Visit (HOSPITAL_COMMUNITY)
Admission: RE | Admit: 2017-07-09 | Discharge: 2017-07-09 | Disposition: A | Discharge status: Home / Self Care | Payer: BC Managed Care – PPO | Source: Ambulatory Visit | Attending: Nurse Practitioner | Admitting: Nurse Practitioner

## 2017-07-09 ENCOUNTER — Encounter (HOSPITAL_COMMUNITY)
Admission: RE | Admit: 2017-07-09 | Discharge: 2017-07-09 | Disposition: A | Discharge status: Home / Self Care | Payer: BC Managed Care – PPO | Source: Ambulatory Visit | Attending: Nurse Practitioner | Admitting: Nurse Practitioner

## 2017-07-09 DIAGNOSIS — G20 Parkinson's disease: Secondary | ICD-10-CM | POA: Insufficient documentation

## 2017-07-09 MED ORDER — IODINE STRONG (LUGOLS) 5 % PO SOLN
0.2000 mL | Freq: Three times a day (TID) | ORAL | Status: DC
  Administered 2017-07-09: 0.8 mL via ORAL
  Filled 2017-07-09 (×6): qty 0.2

## 2017-07-09 MED ORDER — IOFLUPANE I 123 185 MBQ/2.5ML IV SOLN
4.9000 | Freq: Once | INTRAVENOUS | Status: AC
  Administered 2017-07-09: 4.9 via INTRAVENOUS

## 2017-07-22 ENCOUNTER — Other Ambulatory Visit: Payer: Self-pay

## 2017-07-22 MED ORDER — ATORVASTATIN CALCIUM 80 MG PO TABS: 80.0000 mg | ORAL_TABLET | Freq: Every day | ORAL | 3 refills | Status: DC

## 2017-07-22 NOTE — Telephone Encounter (Signed)
Requested Prescriptions   Signed Prescriptions Disp Refills  . atorvastatin (LIPITOR) 80 MG tablet 90 tablet 3    Sig: Take 1 tablet (80 mg total) by mouth daily at 6 PM.    Authorizing Provider: [name] A    Ordering User: Janan Ridge

## 2017-08-07 ENCOUNTER — Encounter: Payer: Self-pay | Admitting: Neurology

## 2017-08-07 ENCOUNTER — Encounter: Payer: Self-pay | Admitting: Internal Medicine

## 2017-08-07 ENCOUNTER — Ambulatory Visit (INDEPENDENT_AMBULATORY_CARE_PROVIDER_SITE_OTHER): Payer: BC Managed Care – PPO | Admitting: Internal Medicine

## 2017-08-07 VITALS — BP 130/80 | HR 57 | Temp 98.1°F | Ht 70.0 in | Wt 223.5 lb

## 2017-08-07 DIAGNOSIS — G20 Parkinson's disease: Secondary | ICD-10-CM | POA: Diagnosis not present

## 2017-08-07 DIAGNOSIS — I2510 Atherosclerotic heart disease of native coronary artery without angina pectoris: Secondary | ICD-10-CM | POA: Diagnosis not present

## 2017-08-07 DIAGNOSIS — Z0000 Encounter for general adult medical examination without abnormal findings: Secondary | ICD-10-CM | POA: Diagnosis not present

## 2017-08-07 DIAGNOSIS — Z23 Encounter for immunization: Secondary | ICD-10-CM | POA: Diagnosis not present

## 2017-08-07 LAB — CBC WITH DIFFERENTIAL/PLATELET
BASOS ABS: 0 10*3/uL (ref 0.0–0.1)
Basophils Relative: 0.6 % (ref 0.0–3.0)
Eosinophils Absolute: 0.2 10*3/uL (ref 0.0–0.7)
Eosinophils Relative: 2.2 % (ref 0.0–5.0)
HCT: 44.1 % (ref 39.0–52.0)
Hemoglobin: 14.8 g/dL (ref 13.0–17.0)
LYMPHS ABS: 1.7 10*3/uL (ref 0.7–4.0)
Lymphocytes Relative: 24.2 % (ref 12.0–46.0)
MCHC: 33.6 g/dL (ref 30.0–36.0)
MCV: 86.2 fl (ref 78.0–100.0)
MONOS PCT: 10.2 % (ref 3.0–12.0)
Monocytes Absolute: 0.7 10*3/uL (ref 0.1–1.0)
NEUTROS ABS: 4.5 10*3/uL (ref 1.4–7.7)
NEUTROS PCT: 62.8 % (ref 43.0–77.0)
PLATELETS: 205 10*3/uL (ref 150.0–400.0)
RBC: 5.11 Mil/uL (ref 4.22–5.81)
RDW: 14.6 % (ref 11.5–15.5)
WBC: 7.1 10*3/uL (ref 4.0–10.5)

## 2017-08-07 LAB — COMPREHENSIVE METABOLIC PANEL
ALT: 5 U/L (ref 0–53)
AST: 19 U/L (ref 0–37)
Albumin: 4.4 g/dL (ref 3.5–5.2)
Alkaline Phosphatase: 52 U/L (ref 39–117)
BUN: 14 mg/dL (ref 6–23)
CO2: 30 meq/L (ref 19–32)
Calcium: 9.6 mg/dL (ref 8.4–10.5)
Chloride: 104 mEq/L (ref 96–112)
Creatinine, Ser: 0.98 mg/dL (ref 0.40–1.50)
GFR: 82.4 mL/min (ref >60.00)
GLUCOSE: 119 mg/dL — AB (ref 70–99)
Potassium: 4.4 mEq/L (ref 3.5–5.1)
SODIUM: 140 meq/L (ref 135–145)
Total Bilirubin: 1.7 mg/dL — ABNORMAL HIGH (ref 0.2–1.2)
Total Protein: 7.1 g/dL (ref 6.0–8.3)

## 2017-08-07 LAB — LIPID PANEL
Cholesterol: 68 mg/dL (ref 0–200)
HDL: 33.1 mg/dL — ABNORMAL LOW (ref >39.00)
LDL Cholesterol: 29 mg/dL (ref 0–99)
NONHDL: 34.9
Total CHOL/HDL Ratio: 2
Triglycerides: 31 mg/dL (ref 0.0–149.0)
VLDL: 6.2 mg/dL (ref 0.0–40.0)

## 2017-08-07 NOTE — Assessment & Plan Note (Signed)
Doing okay but stress with his symptoms and lack of diagnosis Discussed PSA--will hold off again Flu vaccine today Colon due next year

## 2017-08-07 NOTE — Assessment & Plan Note (Signed)
Doing well now 

## 2017-08-07 NOTE — Progress Notes (Signed)
Subjective:    Patient ID: Matthew Doyle, male    DOB: 05/31/1956, 61 y.o.   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 aspirin 81 MG tablet Take 1 tablet (81 mg total) by mouth daily. 30 tablet   . atorvastatin (LIPITOR) 80 MG tablet Take 1 tablet (80 mg total) by mouth daily at 6 PM. 90 tablet 3  . ezetimibe (ZETIA) 10 MG tablet Take 1 tablet (10 mg total) by mouth daily. 90 tablet 3  . gabapentin (NEURONTIN) 300 MG capsule Take 300 mg by mouth daily.     . nitroGLYCERIN (NITROSTAT) 0.4 MG SL tablet Place 1 tablet (0.4 mg total) under the tongue every 5 (five) minutes as needed for chest pain. 25 tablet 3  . rOPINIRole (REQUIP) 4 MG tablet Take 4 mg by mouth at bedtime.    . ticagrelor (BRILINTA) 90 MG TABS tablet Take 1 tablet (90 mg total) by mouth 2 (two) times daily. 180 tablet 3  . losartan (COZAAR) 50 MG tablet Take 1 tablet (50 mg total) by mouth daily. 90 tablet 3   No current facility-administered medications on file prior to visit.     Allergies  Allergen Reactions  . Lisinopril Cough    REACTION: cough with this and/or benazepril    Past Medical History:  Diagnosis Date  . Arthritis    knee  . Atrial fibrillation ([place]) post MI   . CAD (coronary artery disease)    a. 09/2003 Cath/PCI: LAD 80%->3.0x23 Cypher DES;  b. 03/2004 Cath ;  c. NSTEMI 12/2012  cath patent LAD stent, included distal right PDA supplying a small territory, 99% proximal OM 2 status post PCI and DES placement  d. cath 06/20/2016 anterolateral STEMI stent to LAD, PCTA to diag and LCx  . Colon polyps   . Diverticulosis   . Dysrhythmia   . Gout   . Hemorrhoids   . Hyperlipidemia   . Hypertension   . Internal hemorrhoids without mention of complication   . Myocardial infarction ([place])    LAST 12/16/12  . ST elevation (STEMI) myocardial infarction involving left anterior descending coronary artery ([place]) 06/20/2016    Past Surgical History:  Procedure Laterality Date  . BACK SURGERY  10/20/2016   Cervical fusion--Dr.Chester Johnnette Gourd  . CARDIAC CATHETERIZATION  2014   s/p stent  . CARDIAC CATHETERIZATION N/A 06/20/2016   Procedure: Left Heart Cath and Coronary Angiography;  Surgeon: Sherren Mocha, MD;  Location: [place] CV LAB;  Service: Cardiovascular;  Laterality: N/A;  . CARDIAC CATHETERIZATION N/A 06/20/2016   Procedure: Coronary Stent Intervention;  Surgeon: Sherren Mocha, MD;  Location: [place] [facility] LAB;  Service: Cardiovascular;  Laterality: N/A;  . CHOLECYSTECTOMY N/A 03/13/2016   Procedure: LAPAROSCOPIC CHOLECYSTECTOMY WITH INTRAOPERATIVE CHOLANGIOGRAM;  Surgeon: Christene Lye, MD;  Location: [facility] ORS;  Service: General;  Laterality: N/A;  . CHOLECYSTECTOMY, LAPAROSCOPIC  03/13/16  Infection - lengthy diagnosis d/t lack of gallstones  . COLONOSCOPY  10/2013  . CORONARY ANGIOPLASTY     STENTS  . Cypher Stent  10/04   mid LAD- gupta  . LEFT HEART CATHETERIZATION WITH CORONARY ANGIOGRAM N/A 12/17/2012   Procedure: LEFT HEART CATHETERIZATION WITH CORONARY ANGIOGRAM;  Surgeon: Peter M Martinique, MD;  Location: [facility] CATH LAB;  Service: Cardiovascular;  Laterality: N/A;  . PERCUTANEOUS CORONARY STENT INTERVENTION (PCI-S) Right 12/17/2012   Procedure: PERCUTANEOUS CORONARY STENT INTERVENTION (PCI-S);  Surgeon: Peter M Martinique, MD;  Location: [facility] C[facility] LAB;   Service: Cardiovascular;  Laterality: Right;    Family History  Problem Relation Age of Onset  . Diabetes Father   . Coronary artery disease Father        CABG - 5 vessel 15 yr ago  . Heart attack Paternal Uncle        Age 32  . Heart attack Maternal Uncle        Age 57  . Coronary artery disease Maternal Grandfather   . Heart attack Maternal Grandfather        Died in 60s  . Throat cancer Maternal Grandfather   . Hypertension Mother   . Alzheimer's disease Maternal Grandmother   . Hypertension Brother   . Hypertension Brother   . Arrhythmia Brother   . Colon cancer Neg Hx   . Esophageal cancer Neg Hx   . Rectal cancer Neg Hx   . Stomach cancer Neg Hx     Social History   Social History  . Marital status: Married    Spouse name: N/A  . Number of children: 2  . Years of education: N/A   Occupational History  .     [employer] [place] History Main Topics  . Smoking status: Never Smoker  . Smokeless tobacco: Never Used  . Alcohol use No  . Drug use: No  . Sexual activity: Not on file   Other Topics Concern  . Not on file   Social History Narrative   Retired as Social research officer, government for [employer]         Review of Systems  Constitutional:       Weight down a few pounds from last year Some energy problems--with the chronic issues Wears seat belt  HENT: Positive for hearing loss, tinnitus and trouble swallowing.        Teeth not great--avoids the dentist  Eyes: Negative for visual disturbance.       No diplopia or unilateral vision loss  Respiratory: Negative for choking, chest tightness and shortness of breath.   Cardiovascular: Negative for chest pain, palpitations and leg swelling.  Gastrointestinal: Positive for constipation. Negative for abdominal pain.       Occasional blood on toilet paper--no blood in stool No heartburn  Endocrine: Negative for polydipsia and polyuria.  Genitourinary: Negative for difficulty  urinating and urgency.       No sexual problems  Musculoskeletal: Positive for arthralgias. Negative for back pain and joint swelling.       Some right shoulder pain  Skin: Negative for rash.       No suspicious lesions  Allergic/Immunologic: Negative for environmental allergies and immunocompromised state.  Neurological: Positive for tremors. Negative for dizziness, light-headedness and headaches.  Hematological: Negative for adenopathy. Bruises/bleeds easily.  Psychiatric/Behavioral: Positive for sleep disturbance. Negative for dysphoric mood. The patient is nervous/anxious.        Ongoing restless legs symptoms--- really affects  his sleep Now on CPAP--having trouble getting used to it       Objective:   Physical Exam  Constitutional: He appears well-developed and well-nourished. No distress.  HENT:  Head: Normocephalic and atraumatic.  Right Ear: External ear normal.  Left Ear: External ear normal.  Mouth/Throat: Oropharynx is clear and moist. No oropharyngeal exudate.  Eyes: Pupils are equal, round, and reactive to light. Conjunctivae are normal.  Neck: No thyromegaly present.  Cardiovascular: Normal rate, regular rhythm, normal heart sounds and intact distal pulses.  Exam reveals no gallop.   No murmur heard. Pulmonary/Chest: Effort normal and breath sounds normal. No respiratory distress. He has no wheezes. He has no rales.  Abdominal: Soft. There is no tenderness.  Musculoskeletal: He exhibits no edema or tenderness.  Lymphadenopathy:    He has no cervical adenopathy.  Neurological:  Mild resting tremor  Skin: No rash noted. No erythema.  Psychiatric: He has a normal mood and affect. His behavior is normal.          Assessment & Plan:

## 2017-08-07 NOTE — Assessment & Plan Note (Signed)
Started after MI Had 2 minutes of CPR--- could this be related to hypoxic damage? Hasn't responded to sinemet--- and dysphagia (hopefully not another diagnosis like MSA) He wants a second opinion--will set up wit Dr Carles Collet

## 2017-09-10 ENCOUNTER — Ambulatory Visit: Payer: Self-pay | Admitting: Neurology

## 2017-09-24 ENCOUNTER — Ambulatory Visit (INDEPENDENT_AMBULATORY_CARE_PROVIDER_SITE_OTHER): Payer: BC Managed Care – PPO | Admitting: Cardiovascular Disease

## 2017-09-24 ENCOUNTER — Encounter: Payer: Self-pay | Admitting: Cardiovascular Disease

## 2017-09-24 VITALS — BP 128/78 | HR 64 | Ht 71.0 in | Wt 232.8 lb

## 2017-09-24 DIAGNOSIS — I2510 Atherosclerotic heart disease of native coronary artery without angina pectoris: Secondary | ICD-10-CM

## 2017-09-24 DIAGNOSIS — E7800 Pure hypercholesterolemia, unspecified: Secondary | ICD-10-CM

## 2017-09-24 DIAGNOSIS — I10 Essential (primary) hypertension: Secondary | ICD-10-CM

## 2017-09-24 MED ORDER — TICAGRELOR 60 MG PO TABS: 60.0000 mg | ORAL_TABLET | Freq: Two times a day (BID) | ORAL | 11 refills | Status: DC

## 2017-09-24 NOTE — Patient Instructions (Signed)
Medication Instructions:  Your physician has recommended you make the following change in your medication:  DECREASE brilinta to 60mg  twice daily. A new prescription has been sent to your pharmacy.   Labwork: none  Testing/Procedures: none  Follow-Up: Your physician wants you to follow-up in: 6 months with Dr. Fletcher Anon.  You will receive a reminder letter in the mail two months in advance. If you don't receive a letter, please call our office to schedule the follow-up appointment.   Any Other Special Instructions Will Be Listed Below (If Applicable).     If you need a refill on your cardiac medications before your next appointment, please call your pharmacy.

## 2017-09-24 NOTE — Progress Notes (Signed)
Cardiology Office Note   Date:  09/24/2017   ID:  Matthew Doyle 1956-05-22, MRN [id number]  PCP:  Venia Carbon, MD  Cardiologist:   [name], MD   Chief Complaint  Patient presents with  . OTHER    6 month f/u no complaints today. Meds reviewed verbally with pt.      History of Present Illness: Matthew Doyle is a 61 y.o. male who presents for a followup visit regarding coronary artery disease.  He is s/p angioplasty and drug-eluting stent placement to the LAD in 2004 and PCI and drug-eluting stent placement to OM 2 in January 2014 after a small non-ST elevation myocardial infarction. Cardiac catheterization at that time showed patent LAD stent, occluded right PDA distally supplying a small territory and 99% proximal OM 2 stenosis .  Ejection fraction was normal. He presented in July, 2017 with anterolateral ST elevation myocardial infarction complicated by ventricular fibrillation. Cardiac catheterization which showed subtotal thrombotic occlusion of proximal LAD at the diagonal bifurcation and also thrombus in the mid left circumflex. He underwent aspiration thrombectomy of the diagonal and left circumflex and drug-eluting stent placement to the proximal LAD. LV gram showed an ejection fraction of 45% but subsequent echo showed normal LV systolic function.  He has cervical myelopathy and underwent cervical spine surgery in November 2017. The patient continues to have tremors and has features suggestive of parkinsonisms. He is being treated in neurology.  From a cardiac standpoint, he has been doing very well with no chest pain, shortness of breath or palpitations.  He takes his medications regularly.  He retired last week and started going to the gym.  Past Medical History:  Diagnosis Date  . Arthritis    knee  . Atrial fibrillation ([place]) post MI   . CAD (coronary artery disease)    a. 09/2003 Cath/PCI: LAD 80%->3.0x23 Cypher DES;  b. 03/2004 Cath ;  c. NSTEMI  12/2012 cath patent LAD stent, included distal right PDA supplying a small territory, 99% proximal OM 2 status post PCI and DES placement  d. cath 06/20/2016 anterolateral STEMI stent to LAD, PCTA to diag and LCx  . Colon polyps   . Diverticulosis   . Dysrhythmia   . Gout   . Hemorrhoids   . Hyperlipidemia   . Hypertension   . Internal hemorrhoids without mention of complication   . Myocardial infarction ([place])    LAST 12/16/12  . ST elevation (STEMI) myocardial infarction involving left anterior descending coronary artery ([place]) 06/20/2016    Past Surgical History:  Procedure Laterality Date  . BACK SURGERY  10/20/2016   Cervical fusion--Dr.Chester Johnnette Gourd  . CARDIAC CATHETERIZATION  2014   s/p stent  . CARDIAC CATHETERIZATION N/A 06/20/2016   Procedure: Left Heart Cath and Coronary Angiography;  Surgeon: Sherren Mocha, MD;  Location: [place] [facility];  Service: Cardiovascular;  Laterality: N/A;  . CARDIAC CATHETERIZATION N/A 06/20/2016   Procedure: Coronary Stent Intervention;  Surgeon: Sherren Mocha, MD;  Location: [place] [facility];  Service: Cardiovascular;  Laterality: N/A;  . CHOLECYSTECTOMY N/A 03/13/2016   Procedure: LAPAROSCOPIC CHOLECYSTECTOMY WITH INTRAOPERATIVE CHOLANGIOGRAM;  Surgeon: Christene Lye, MD;  Location: [facility] ORS;  Service: General;  Laterality: N/A;  . CHOLECYSTECTOMY, LAPAROSCOPIC  03/13/16   Infection - lengthy diagnosis d/t lack of gallstones  . COLONOSCOPY  10/2013  . CORONARY ANGIOPLASTY     STENTS  . Cypher Stent  10/04   mid LAD- gupta  . LEFT  HEART CATHETERIZATION WITH CORONARY ANGIOGRAM N/A 12/17/2012   Procedure: LEFT HEART CATHETERIZATION WITH CORONARY ANGIOGRAM;  Surgeon: Peter M Martinique, MD;  Location: [facility] CATH LAB;  Service: Cardiovascular;  Laterality: N/A;  . PERCUTANEOUS CORONARY STENT INTERVENTION (PCI-S) Right 12/17/2012   Procedure: PERCUTANEOUS CORONARY STENT INTERVENTION (PCI-S);  Surgeon: Peter M Martinique, MD;  Location: [facility] CATH LAB;   Service: Cardiovascular;  Laterality: Right;     Current Outpatient Prescriptions  Medication Sig Dispense Refill  . aspirin 81 MG tablet Take 1 tablet (81 mg total) by mouth daily. 30 tablet   . atorvastatin (LIPITOR) 80 MG tablet Take 1 tablet (80 mg total) by mouth daily at 6 PM. 90 tablet 3  . carbidopa-levodopa (SINEMET CR) 50-200 MG tablet Take 2 tablets by mouth 2 (two) times daily.    Marland Kitchen ezetimibe (ZETIA) 10 MG tablet Take 1 tablet (10 mg total) by mouth daily. 90 tablet 3  . gabapentin (NEURONTIN) 300 MG capsule Take 300 mg by mouth daily.     Marland Kitchen losartan (COZAAR) 50 MG tablet Take 1 tablet (50 mg total) by mouth daily. 90 tablet 3  . nitroGLYCERIN (NITROSTAT) 0.4 MG SL tablet Place 1 tablet (0.4 mg total) under the tongue every 5 (five) minutes as needed for chest pain. 25 tablet 3  . ticagrelor (BRILINTA) 90 MG TABS tablet Take 1 tablet (90 mg total) by mouth 2 (two) times daily. 180 tablet 3   No current facility-administered medications for this visit.     Allergies:   Lisinopril    Social History:  The patient  reports that he has never smoked. He has never used smokeless tobacco. He reports that he does not drink alcohol or use drugs.   Family History:  The patient's family history includes Alzheimer's disease in his maternal grandmother; Arrhythmia in his brother; Coronary artery disease in his father and maternal grandfather; Diabetes in his father; Heart attack in his maternal grandfather, maternal uncle, and paternal uncle; Hypertension in his brother, brother, and mother; Throat cancer in his maternal grandfather.    ROS:  Please see the history of present illness.   Otherwise, review of systems are positive for none.   All other systems are reviewed and negative.    PHYSICAL EXAM: VS:  BP 128/78 (BP Location: Left Arm, Patient Position: Sitting, Cuff Size: Normal)   Pulse 64   Ht 5\' 11"  (1.803 m)   Wt 232 lb 12 oz (105.6 kg)   BMI 32.46 kg/m  , BMI Body mass  index is 32.46 kg/m. GEN: Well nourished, well developed, in no acute distress  HEENT: normal  Neck: no JVD, carotid bruits, or masses Cardiac: RRR; no murmurs, rubs, or gallops,no edema  Respiratory:  clear to auscultation bilaterally, normal work of breathing GI: soft, nontender, nondistended, + BS MS: no deformity or atrophy  Skin: warm and dry, no rash Neuro:  Strength and sensation are intact Psych: euthymic mood, full affect   EKG:  EKG is ordered today. EKG showed normal sinus rhythm with no significant ST or T wave changes.   Recent Labs: 08/07/2017: ALT 5; BUN 14; Creatinine, Ser 0.98; Hemoglobin 14.8; Platelets 205.0; Potassium 4.4; Sodium 140    Lipid Panel    Component Value Date/Time   CHOL 68 08/07/2017 1122   CHOL 91 (L) 03/31/2013 0914   TRIG 31.0 08/07/2017 1122   HDL 33.10 (L) 08/07/2017 1122   HDL 36 (L) 03/31/2013 0914   CHOLHDL 2 08/07/2017 1122   VLDL 6.2  08/07/2017 1122   LDLCALC 29 08/07/2017 1122   LDLCALC 47 03/31/2013 0914      Wt Readings from Last 3 Encounters:  09/24/17 232 lb 12 oz (105.6 kg)  08/07/17 223 lb 8 oz (101.4 kg)  04/02/17 219 lb 8 oz (99.6 kg)       ASSESSMENT AND PLAN:  1.  Coronary artery disease involving native coronary arteries without angina: He is doing well overall with no anginal symptoms. Continue medical therapy.  Continue lifelong dual antiplatelet therapy but I am going to decrease Brilinta to 60 mg twice daily.  2. Essential hypertension: Blood pressure is  well controlled on current medications.  Orthostasis improved after decreasing losartan.  3. Hyperlipidemia: Continue high dose atorvastatin and Zetia.  Most recent lipid profile showed an LDL of 29.  HDL was low at 33 but hopefully that can improve with exercise.  4.  Sleep apnea: Currently on CPAP.  Disposition:   FU with me in 6 months  Signed,  [name], MD  09/24/2017 9:54 AM    [place]

## 2017-10-05 NOTE — Progress Notes (Signed)
Matthew Doyle was seen today in the movement disorders clinic for neurologic consultation at the request of Venia Carbon, MD.  The consultation is for the evaluation of Parkinsonism.  He has previously seen Dr. Manuella Ghazi.  The records that were made available to me were reviewed.  Patient first saw Dr. Manuella Ghazi in September, 2017.  His complaints were left arm and shoulder pain/weakness and bilateral hand tremors since 2015.  Notes indicate that tremors were worse after the patient had a heart attack.  Tremors were noted when the patient was trying to drink out of a glass.  At his first visit in September, 2017 it was felt that he had cervical radiculopathy and essential tremor.  He was referred to [facility] for his cervical radiculopathy and ultimately diagnosed with a cervical myelopathy.  He did have surgery for that in November, 2017.  He saw Dr. Manuella Ghazi in December, 2017 and Parkinson's disease/parkinsonism was discussed.  He was placed on Requip XL, 2 mg, in February, 2018.  This was increased to 4 mg about a month later.  Levodopa was started in May, 2018 at carbidopa/levodopa 25/100, 1 tablet 3 times per day.  In July, 2018 he was told to change this to carbidopa/levodopa 50/200 and work to 2 tablets 2 times per day.  Pt stopped the carbidopa/levodopa a few days ago.  It made no difference.  Pt states that his biggest issue is tremor.  Pt had a DaT scan on 07/09/17 that was normal.     Tremor: Yes.      At rest or with activation?  Both but worse with use  When is it noted the most?  eating  Fam hx of tremor?  Yes.  , paternal GF had tremor  Located where?  Hands and occasionally feet  Affected by caffeine: unknown (rare soda)  Affected by alcohol:  Unknown - doesn't drink  Affected by stress:  Yes.    Affected by fatigue:  No.  Spills soup if on spoon:  No.  Spills glass of liquid if full:  No.  Affects ADL's (tying shoes, brushing teeth, etc):  Yes.  , just tying shoes  Other specific sx's:     Voice: little softer Sleep: trouble staying asleep (has OSAS and uses CPAP)  Vivid Dreams:  No.  Acting out dreams:  No. Wet Pillows: Yes.   Postural symptoms:  Sometimes its good but better lately  Falls?  No., none x 4-5 months Bradykinesia symptoms: no bradykinesia noted Loss of smell:  No. Loss of taste:  No. Urinary Incontinence:  No. Difficulty Swallowing:  Yes.  , occasionally Handwriting, micrographia: No. Depression:  no Memory changes:  Yes.  , with names Hallucinations:  No.  visual distortions: No. N/V:  No. Lightheaded:  No.  Syncope: No. Diplopia:  No. Dyskinesia:  No.  Describes a restlessness in the legs/crawling sensation on the legs at night  Neuroimaging of the brain has  previously been performed.  It is not available for my review today.  It was done in 10/2016 and reported to be unremarkable via [facility]   ALLERGIES:   Allergies  Allergen Reactions  . Lisinopril Cough    REACTION: cough with this and/or benazepril    CURRENT MEDICATIONS:  Outpatient Encounter Medications as of 10/06/2017  Medication Sig  . aspirin 81 MG tablet Take 1 tablet (81 mg total) by mouth daily.  Marland Kitchen atorvastatin (LIPITOR) 80 MG tablet Take 1 tablet (80 mg total) by mouth  daily at 6 PM.  . ezetimibe (ZETIA) 10 MG tablet Take 1 tablet (10 mg total) by mouth daily.  Marland Kitchen gabapentin (NEURONTIN) 300 MG capsule Take 300 mg by mouth daily.   Marland Kitchen losartan (COZAAR) 50 MG tablet Take 1 tablet (50 mg total) by mouth daily.  . nitroGLYCERIN (NITROSTAT) 0.4 MG SL tablet Place 1 tablet (0.4 mg total) under the tongue every 5 (five) minutes as needed for chest pain.  . ticagrelor (BRILINTA) 60 MG TABS tablet Take 1 tablet (60 mg total) by mouth 2 (two) times daily.  . [DISCONTINUED] carbidopa-levodopa (SINEMET CR) 50-200 MG tablet Take 2 tablets by mouth 2 (two) times daily.   No facility-administered encounter medications on file as of 10/06/2017.     PAST MEDICAL HISTORY:   Past  Medical History:  Diagnosis Date  . Arthritis    knee  . Atrial fibrillation ([place]) post MI   . CAD (coronary artery disease)    a. 09/2003 Cath/PCI: LAD 80%->3.0x23 Cypher DES;  b. 03/2004 Cath ;  c. NSTEMI 12/2012 cath patent LAD stent, included distal right PDA supplying a small territory, 99% proximal OM 2 status post PCI and DES placement  d. cath 06/20/2016 anterolateral STEMI stent to LAD, PCTA to diag and LCx  . Colon polyps   . Diverticulosis   . Dysrhythmia   . Gout   . Hemorrhoids   . Hyperlipidemia   . Hypertension   . Internal hemorrhoids without mention of complication   . Myocardial infarction ([place])    LAST 12/16/12  . ST elevation (STEMI) myocardial infarction involving left anterior descending coronary artery ([place]) 06/20/2016    PAST SURGICAL HISTORY:   Past Surgical History:  Procedure Laterality Date  . BACK SURGERY  10/20/2016   Cervical fusion--Dr.Chester Johnnette Gourd  . CARDIAC CATHETERIZATION  2014   s/p stent  . CHOLECYSTECTOMY, LAPAROSCOPIC  03/13/16   Infection - lengthy diagnosis d/t lack of gallstones  . COLONOSCOPY  10/2013  . CORONARY ANGIOPLASTY     STENTS  . Cypher Stent  10/04   mid LAD- gupta    SOCIAL HISTORY:   Social History   Socioeconomic History  . Marital status: Married    Spouse name: Not on file  . Number of children: 2  . Years of education: Not on file  . Highest education level: Not on file  Social Needs  . Financial resource strain: Not on file  . Food insecurity - worry: Not on file  . Food insecurity - inability: Not on file  . Transportation needs - medical: Not on file  . Transportation needs - non-medical: Not on file  Occupational History  . Occupation: retired  . Occupation: Music therapist: [employer]  Tobacco Use  . Smoking status: Never Smoker  . Smokeless tobacco: Never Used  Substance and Sexual Activity  . Alcohol use: No    Alcohol/week: 0.0 oz  . Drug use: No  . Sexual activity: Not on  file  Other Topics Concern  . Not on file  Social History Narrative   Retired as Social research officer, government for [employer]       FAMILY HISTORY:   Family Status  Relation Name Status  . Father  Alive  . Annamarie Major  Deceased  . Mat Uncle  Deceased  . MGF  Deceased  . Mother  Alive       fairly healthy  . MGM  Deceased  . Brother  Alive  . Brother  Alive  . Son 2 Alive  . Neg Hx  (Not Specified)    ROS:  A complete 10 system review of systems was obtained and was unremarkable apart from what is mentioned above.  PHYSICAL EXAMINATION:    VITALS:  There were no vitals filed for this visit.  GEN:  The patient appears stated age and is in NAD. HEENT:  Normocephalic, atraumatic.  The mucous membranes are moist. The superficial temporal arteries are without ropiness or tenderness. CV:  RRR Lungs:  CTAB Neck/HEME:  There are no carotid bruits bilaterally.  Neurological examination:  Orientation: The patient is alert and oriented x3. Fund of knowledge is appropriate.  Recent and remote memory are intact.  Attention and concentration are normal.    Able to name objects and repeat phrases. Cranial nerves: There is good facial symmetry. Pupils are equal round and reactive to light bilaterally. Fundoscopic exam reveals clear margins bilaterally. Extraocular muscles are intact. The visual fields are full to confrontational testing. The speech is fluent and clear. Soft palate rises symmetrically and there is no tongue deviation. Hearing is intact to conversational tone. Sensation: Sensation is intact to light and pinprick throughout (facial, trunk, extremities). Vibration is intact at the bilateral big toe. There is no extinction with double simultaneous stimulation. There is no sensory dermatomal level identified. Motor: Strength is 5/5 in the bilateral upper and lower extremities.   Shoulder shrug is equal and symmetric.  There is no pronator drift. Deep tendon reflexes: Deep tendon  reflexes are 2/4 at the bilateral biceps, triceps, brachioradialis, patella and achilles. Plantar responses are downgoing bilaterally.  Movement examination: Tone: There is normal tone in the bilateral upper extremities.  The tone in the lower extremities is normal.  Abnormal movements: There is a rest tremor on the L.  There is a postural tremor on the right but it doesn't emerge quickly.  He has a mild tremor on both sides when given a weight to hold.  he has no difficulty with archimedes spirals.  he has no difficulty when asked to pour a full glass of water from one glass to another. Coordination:  There is no decremation with RAM's, with any form of RAMS, including alternating supination and pronation of the forearm, hand opening and closing, finger taps, heel taps and toe taps. Gait and Station: The patient has no difficulty arising out of a deep-seated chair without the use of the hands. The patient's stride length is normal.    LABS  Lab Results  Component Value Date   TSH 1.443 06/20/2016     ASSESSMENT/PLAN:  1.  Tremor  -DaT scan was negative in 07/2017.  I suspect that this is essential tremor.  He and I discussed this today.  We discussed meds.  I talked to him about primidone but it interacts with brilinta.  I talked to him about artane.  He is not interested right now, given potential SE.  This could represent SWEDD (scan without evidence of dopamine deficiency).  Patients with SWEDD often times do not progress the same as others and is generally felt to represent a variety of clinical conditions and not just a single clinical entity.  It is felt that these patients often times are misdiagnosed as Parkinson's disease, but a small percent of patients may actually have Parkinson's disease based on clinical progression characteristics of Parkinson's or positive responsiveness to levodopa.  I don't suspect that this is the case in him.    -TSH should be eval  if not already done so  2.   RLS  -gabapentin is helping.  -if not already done so, may want to check ferritin and iron studies.  3.  I would like to see him at least yearly and he was agreeable.  Follow up is anticipated in the next few months, sooner should new neurologic issues arise.  Mod complexity visit.    Cc:  Venia Carbon, MD

## 2017-10-06 ENCOUNTER — Ambulatory Visit: Payer: BC Managed Care – PPO | Admitting: Neurology

## 2017-10-06 ENCOUNTER — Encounter: Payer: Self-pay | Admitting: Neurology

## 2017-10-06 VITALS — BP 108/66 | HR 65 | Ht 71.0 in | Wt 235.0 lb

## 2017-10-06 DIAGNOSIS — G250 Essential tremor: Secondary | ICD-10-CM | POA: Diagnosis not present

## 2017-10-06 DIAGNOSIS — G2581 Restless legs syndrome: Secondary | ICD-10-CM

## 2018-04-29 ENCOUNTER — Other Ambulatory Visit: Payer: Self-pay | Admitting: Cardiovascular Disease

## 2018-04-29 NOTE — Telephone Encounter (Signed)
Please contact office for a follow up appointment with Dr. Fletcher Anon.

## 2018-05-24 ENCOUNTER — Other Ambulatory Visit: Payer: Self-pay | Admitting: Cardiovascular Disease

## 2018-07-16 ENCOUNTER — Ambulatory Visit: Payer: BC Managed Care – PPO | Admitting: Cardiovascular Disease

## 2018-07-16 ENCOUNTER — Encounter: Payer: Self-pay | Admitting: Cardiovascular Disease

## 2018-07-16 VITALS — BP 139/90 | HR 60 | Ht 71.0 in | Wt 238.5 lb

## 2018-07-16 DIAGNOSIS — I10 Essential (primary) hypertension: Secondary | ICD-10-CM | POA: Diagnosis not present

## 2018-07-16 DIAGNOSIS — E785 Hyperlipidemia, unspecified: Secondary | ICD-10-CM

## 2018-07-16 DIAGNOSIS — I2510 Atherosclerotic heart disease of native coronary artery without angina pectoris: Secondary | ICD-10-CM | POA: Diagnosis not present

## 2018-07-16 DIAGNOSIS — G4730 Sleep apnea, unspecified: Secondary | ICD-10-CM

## 2018-07-16 MED ORDER — ATORVASTATIN CALCIUM 80 MG PO TABS: ORAL_TABLET | ORAL | 1 refills | Status: DC

## 2018-07-16 MED ORDER — LOSARTAN POTASSIUM 100 MG PO TABS: 100.0000 mg | ORAL_TABLET | Freq: Every day | ORAL | 3 refills | Status: DC

## 2018-07-16 MED ORDER — TICAGRELOR 60 MG PO TABS: 60.0000 mg | ORAL_TABLET | Freq: Two times a day (BID) | ORAL | 1 refills | Status: DC

## 2018-07-16 NOTE — Patient Instructions (Signed)
Medication Instructions: INCREASE the Losartan to 100 mg daily  If you need a refill on your cardiac medications before your next appointment, please call your pharmacy.    Follow-Up: Your physician wants you to follow-up in 6 months with Dr. Fletcher Anon. You will receive a reminder letter in the mail two months in advance. If you don't receive a letter, please call our office at [phone] to schedule this follow-up appointment.   Thank you for choosing [facility] at [facility]!

## 2018-07-16 NOTE — Progress Notes (Signed)
Cardiology Office Note   Date:  07/16/2018   ID:  Matthew Doyle, Matthew Doyle 1956-08-07, MRN [id number]  PCP:  Venia Carbon, MD  Cardiologist:   [name], MD   Chief Complaint  Patient presents with  . OTHER    6 month f/u d/c zetia for several months now due to cost. Meds reviewed verbally with pt.      History of Present Illness: Matthew Doyle is a 62 y.o. male who presents for a followup visit regarding coronary artery disease.  He is s/p angioplasty and drug-eluting stent placement to the LAD in 2004 and PCI and drug-eluting stent placement to OM 2 in January 2014 after a small non-ST elevation myocardial infarction. Cardiac catheterization at that time showed patent LAD stent, occluded right PDA distally supplying a small territory and 99% proximal OM 2 stenosis .  Ejection fraction was normal. He presented in July, 2017 with anterolateral ST elevation myocardial infarction complicated by ventricular fibrillation. Cardiac catheterization which showed subtotal thrombotic occlusion of proximal LAD at the diagonal bifurcation and also thrombus in the mid left circumflex. He underwent aspiration thrombectomy of the diagonal and left circumflex and drug-eluting stent placement to the proximal LAD. LV gram showed an ejection fraction of 45% but subsequent echo showed normal LV systolic function.  He had cervical myelopathy and underwent cervical spine surgery in November 2017. He continues to struggle with essential tremors.  Otherwise he has been doing well with no recent chest pain, shortness of breath or palpitations. He stopped taking Zetia due to cost.  He continues to take his other medications.  Past Medical History:  Diagnosis Date  . Arthritis    knee  . Atrial fibrillation ([place]) post MI   . CAD (coronary artery disease)    a. 09/2003 Cath/PCI: LAD 80%->3.0x23 Cypher DES;  b. 03/2004 Cath ;  c. NSTEMI 12/2012 cath patent LAD stent, included distal right PDA supplying a  small territory, 99% proximal OM 2 status post PCI and DES placement  d. cath 06/20/2016 anterolateral STEMI stent to LAD, PCTA to diag and LCx  . Colon polyps   . Diverticulosis   . Dysrhythmia   . Gout   . Hemorrhoids   . Hyperlipidemia   . Hypertension   . Internal hemorrhoids without mention of complication   . Myocardial infarction ([place])    LAST 12/16/12  . ST elevation (STEMI) myocardial infarction involving left anterior descending coronary artery ([place]) 06/20/2016    Past Surgical History:  Procedure Laterality Date  . BACK SURGERY  10/20/2016   Cervical fusion--Dr.Chester Johnnette Gourd  . CARDIAC CATHETERIZATION  2014   s/p stent  . CARDIAC CATHETERIZATION N/A 06/20/2016   Procedure: Left Heart Cath and Coronary Angiography;  Surgeon: Sherren Mocha, MD;  Location: [place] [facility];  Service: Cardiovascular;  Laterality: N/A;  . CARDIAC CATHETERIZATION N/A 06/20/2016   Procedure: Coronary Stent Intervention;  Surgeon: Sherren Mocha, MD;  Location: [place] [facility];  Service: Cardiovascular;  Laterality: N/A;  . CHOLECYSTECTOMY N/A 03/13/2016   Procedure: LAPAROSCOPIC CHOLECYSTECTOMY WITH INTRAOPERATIVE CHOLANGIOGRAM;  Surgeon: Christene Lye, MD;  Location: [facility];  Service: General;  Laterality: N/A;  . CHOLECYSTECTOMY, LAPAROSCOPIC  03/13/16   Infection - lengthy diagnosis d/t lack of gallstones  . COLONOSCOPY  10/2013  . CORONARY ANGIOPLASTY     STENTS  . Cypher Stent  10/04   mid LAD- gupta  . LEFT HEART CATHETERIZATION WITH CORONARY ANGIOGRAM N/A 12/17/2012   Procedure:  LEFT HEART CATHETERIZATION WITH CORONARY ANGIOGRAM;  Surgeon: Peter M Martinique, MD;  Location: [facility] C[facility];  Service: Cardiovascular;  Laterality: N/A;  . PERCUTANEOUS CORONARY STENT INTERVENTION (PCI-S) Right 12/17/2012   Procedure: PERCUTANEOUS CORONARY STENT INTERVENTION (PCI-S);  Surgeon: Peter M Martinique, MD;  Location: [facility] CATH LAB;  Service: Cardiovascular;  Laterality: Right;     Current  Outpatient Medications  Medication Sig Dispense Refill  . aspirin 81 MG tablet Take 1 tablet (81 mg total) by mouth daily. 30 tablet   . atorvastatin (LIPITOR) 80 MG tablet TAKE 1 TABLET BY MOUTH ONCE A DAY AT SIX IN THE EVENING 90 tablet 1  . losartan (COZAAR) 100 MG tablet Take 1 tablet (100 mg total) by mouth daily. 90 tablet 3  . nitroGLYCERIN (NITROSTAT) 0.4 MG SL tablet Place 1 tablet (0.4 mg total) under the tongue every 5 (five) minutes as needed for chest pain. 25 tablet 3  . ticagrelor (BRILINTA) 60 MG TABS tablet Take 1 tablet (60 mg total) by mouth 2 (two) times daily. 180 tablet 1   No current facility-administered medications for this visit.     Allergies:   Lisinopril    Social History:  The patient  reports that he has never smoked. He has never used smokeless tobacco. He reports that he does not drink alcohol or use drugs.   Family History:  The patient's family history includes Alzheimer's disease in his maternal grandmother; Coronary artery disease in his father and maternal grandfather; Diabetes in his father; Heart attack in his maternal grandfather, maternal uncle, and paternal uncle; Hypertension in his brother and mother; Throat cancer in his maternal grandfather.    ROS:  Please see the history of present illness.   Otherwise, review of systems are positive for none.   All other systems are reviewed and negative.    PHYSICAL EXAM: VS:  BP 139/90 (BP Location: Left Arm, Patient Position: Sitting, Cuff Size: Large)   Pulse 60   Ht 5\' 11"  (1.803 m)   Wt 238 lb 8 oz (108.2 kg)   BMI 33.26 kg/m  , BMI Body mass index is 33.26 kg/m. GEN: Well nourished, well developed, in no acute distress  HEENT: normal  Neck: no JVD, carotid bruits, or masses Cardiac: RRR; no murmurs, rubs, or gallops,no edema  Respiratory:  clear to auscultation bilaterally, normal work of breathing GI: soft, nontender, nondistended, + BS MS: no deformity or atrophy  Skin: warm and dry, no  rash Neuro:  Strength and sensation are intact Psych: euthymic mood, full affect   EKG:  EKG is ordered today. EKG showed normal sinus rhythm with no significant ST or T wave changes.   Recent Labs: 08/07/2017: ALT 5; BUN 14; Creatinine, Ser 0.98; Hemoglobin 14.8; Platelets 205.0; Potassium 4.4; Sodium 140    Lipid Panel    Component Value Date/Time   CHOL 68 08/07/2017 1122   CHOL 91 (L) 03/31/2013 0914   TRIG 31.0 08/07/2017 1122   HDL 33.10 (L) 08/07/2017 1122   HDL 36 (L) 03/31/2013 0914   CHOLHDL 2 08/07/2017 1122   VLDL 6.2 08/07/2017 1122   LDLCALC 29 08/07/2017 1122   LDLCALC 47 03/31/2013 0914      Wt Readings from Last 3 Encounters:  07/16/18 238 lb 8 oz (108.2 kg)  10/06/17 235 lb (106.6 kg)  09/24/17 232 lb 12 oz (105.6 kg)       ASSESSMENT AND PLAN:  1.  Coronary artery disease involving native coronary arteries without angina:  He is doing well overall with no anginal symptoms. Continue medical therapy.  Continue lifelong dual antiplatelet therapy.  We can consider switching Brilinta to Plavix if cost becomes an issue.  2. Essential hypertension: Blood pressure is trending up again.  I increased  losartan to 100 mg daily.  3. Hyperlipidemia: Continue high dose atorvastatin .  Most recent LDL was 33.  Thus, I think it is reasonable to be off Zetia.  4.  Sleep apnea: Currently on CPAP.  Disposition:   FU with me in 6 months  Signed,  [name], MD  07/16/2018 8:34 AM    [place]

## 2018-08-11 ENCOUNTER — Encounter: Payer: Self-pay | Admitting: Internal Medicine

## 2018-08-11 ENCOUNTER — Ambulatory Visit (INDEPENDENT_AMBULATORY_CARE_PROVIDER_SITE_OTHER): Payer: BC Managed Care – PPO | Admitting: Internal Medicine

## 2018-08-11 VITALS — BP 138/88 | HR 60 | Temp 98.3°F | Ht 70.5 in | Wt 235.0 lb

## 2018-08-11 DIAGNOSIS — I2510 Atherosclerotic heart disease of native coronary artery without angina pectoris: Secondary | ICD-10-CM | POA: Diagnosis not present

## 2018-08-11 DIAGNOSIS — Z125 Encounter for screening for malignant neoplasm of prostate: Secondary | ICD-10-CM | POA: Diagnosis not present

## 2018-08-11 DIAGNOSIS — G20 Parkinson's disease: Secondary | ICD-10-CM

## 2018-08-11 DIAGNOSIS — Z0000 Encounter for general adult medical examination without abnormal findings: Secondary | ICD-10-CM | POA: Diagnosis not present

## 2018-08-11 DIAGNOSIS — Z23 Encounter for immunization: Secondary | ICD-10-CM

## 2018-08-11 LAB — COMPREHENSIVE METABOLIC PANEL
ALK PHOS: 55 U/L (ref 39–117)
ALT: 21 U/L (ref 0–53)
AST: 16 U/L (ref 0–37)
Albumin: 4.4 g/dL (ref 3.5–5.2)
BUN: 19 mg/dL (ref 6–23)
CALCIUM: 9.7 mg/dL (ref 8.4–10.5)
CO2: 31 mEq/L (ref 19–32)
CREATININE: 0.99 mg/dL (ref 0.40–1.50)
Chloride: 103 mEq/L (ref 96–112)
GFR: 81.18 mL/min (ref >60.00)
GLUCOSE: 110 mg/dL — AB (ref 70–99)
Potassium: 4.5 mEq/L (ref 3.5–5.1)
Sodium: 139 mEq/L (ref 135–145)
TOTAL PROTEIN: 7.3 g/dL (ref 6.0–8.3)
Total Bilirubin: 1.3 mg/dL — ABNORMAL HIGH (ref 0.2–1.2)

## 2018-08-11 LAB — CBC
HCT: 46.6 % (ref 39.0–52.0)
HEMOGLOBIN: 15.8 g/dL (ref 13.0–17.0)
MCHC: 33.9 g/dL (ref 30.0–36.0)
MCV: 84.8 fl (ref 78.0–100.0)
PLATELETS: 214 10*3/uL (ref 150.0–400.0)
RBC: 5.5 Mil/uL (ref 4.22–5.81)
RDW: 14.7 % (ref 11.5–15.5)
WBC: 7.7 10*3/uL (ref 4.0–10.5)

## 2018-08-11 LAB — LIPID PANEL
Cholesterol: 96 mg/dL (ref 0–200)
HDL: 39.3 mg/dL (ref >39.00)
LDL Cholesterol: 46 mg/dL (ref 0–99)
NONHDL: 56.31
TRIGLYCERIDES: 52 mg/dL (ref 0.0–149.0)
Total CHOL/HDL Ratio: 2
VLDL: 10.4 mg/dL (ref 0.0–40.0)

## 2018-08-11 LAB — PSA: PSA: 1.24 ng/mL (ref 0.10–4.00)

## 2018-08-11 NOTE — Assessment & Plan Note (Signed)
Likely atypical Parkinson's Symptoms mild Observation only for now

## 2018-08-11 NOTE — Progress Notes (Signed)
Subjective:    Patient ID: Matthew Doyle, male    DOB: 1956/09/24, 62 y.o.   MRN: [id number]  HPI Here for physical Did retire--- got part time job in [place], but this ran out Plans to pick up something else  No change in Parkinsonism Not keeping him from doing anything--just some things are challenging  No heart problems Not regular with gym--with kids around in summer. Now going back to [employer] Tries to watch his eating--but "not as strict". No sugared drinks  Current Outpatient Medications on File Prior to Visit  Medication Sig Dispense Refill  . aspirin 81 MG tablet Take 1 tablet (81 mg total) by mouth daily. 30 tablet   . atorvastatin (LIPITOR) 80 MG tablet TAKE 1 TABLET BY MOUTH ONCE A DAY AT SIX IN THE EVENING 90 tablet 1  . losartan (COZAAR) 100 MG tablet Take 1 tablet (100 mg total) by mouth daily. 90 tablet 3  . nitroGLYCERIN (NITROSTAT) 0.4 MG SL tablet Place 1 tablet (0.4 mg total) under the tongue every 5 (five) minutes as needed for chest pain. 25 tablet 3  . ticagrelor (BRILINTA) 60 MG TABS tablet Take 1 tablet (60 mg total) by mouth 2 (two) times daily. 180 tablet 1   No current facility-administered medications on file prior to visit.     Allergies  Allergen Reactions  . Lisinopril Cough    REACTION: cough with this and/or benazepril    Past Medical History:  Diagnosis Date  . Arthritis    knee  . Atrial fibrillation ([place]) post MI   . CAD (coronary artery disease)    a. 09/2003 Cath/PCI: LAD 80%->3.0x23 Cypher DES;  b. 03/2004 Cath ;  c. NSTEMI 12/2012 cath patent LAD stent, included distal right PDA supplying a small territory, 99% proximal OM 2 status post PCI and DES placement  d. cath 06/20/2016 anterolateral STEMI stent to LAD, PCTA to diag and LCx  . Colon polyps   . Diverticulosis   . Dysrhythmia   . Gout   . Hemorrhoids   . Hyperlipidemia   . Hypertension   . Internal hemorrhoids without mention of complication   . Myocardial infarction  ([place])    LAST 12/16/12  . ST elevation (STEMI) myocardial infarction involving left anterior descending coronary artery ([place]) 06/20/2016    Past Surgical History:  Procedure Laterality Date  . BACK SURGERY  10/20/2016   Cervical fusion--Dr.Chester Johnnette Gourd  . CARDIAC CATHETERIZATION  2014   s/p stent  . CARDIAC CATHETERIZATION N/A 06/20/2016   Procedure: Left Heart Cath and Coronary Angiography;  Surgeon: Sherren Mocha, MD;  Location: [place] [facility];  Service: Cardiovascular;  Laterality: N/A;  . CARDIAC CATHETERIZATION N/A 06/20/2016   Procedure: Coronary Stent Intervention;  Surgeon: Sherren Mocha, MD;  Location: [place] [facility];  Service: Cardiovascular;  Laterality: N/A;  . CHOLECYSTECTOMY N/A 03/13/2016   Procedure: LAPAROSCOPIC CHOLECYSTECTOMY WITH INTRAOPERATIVE CHOLANGIOGRAM;  Surgeon: Christene Lye, MD;  Location: [facility] ORS;  Service: General;  Laterality: N/A;  . CHOLECYSTECTOMY, LAPAROSCOPIC  03/13/16   Infection - lengthy diagnosis d/t lack of gallstones  . COLONOSCOPY  10/2013  . CORONARY ANGIOPLASTY     STENTS  . Cypher Stent  10/04   mid LAD- gupta  . LEFT HEART CATHETERIZATION WITH CORONARY ANGIOGRAM N/A 12/17/2012   Procedure: LEFT HEART CATHETERIZATION WITH CORONARY ANGIOGRAM;  Surgeon: Peter M Martinique, MD;  Location: [facility] CATH LAB;  Service: Cardiovascular;  Laterality: N/A;  . PERCUTANEOUS CORONARY STENT  INTERVENTION (PCI-S) Right 12/17/2012   Procedure: PERCUTANEOUS CORONARY STENT INTERVENTION (PCI-S);  Surgeon: Peter M Martinique, MD;  Location: [facility] CATH LAB;  Service: Cardiovascular;  Laterality: Right;    Family History  Problem Relation Age of Onset  . Diabetes Father   . Coronary artery disease Father        CABG - 5 vessel 15 yr ago  . Heart attack Paternal Uncle        Age 66  . Heart attack Maternal Uncle        Age 21  . Coronary artery disease Maternal Grandfather   . Heart attack Maternal Grandfather        Died in 30s  . Throat cancer  Maternal Grandfather   . Hypertension Mother   . Alzheimer's disease Maternal Grandmother   . Hypertension Brother   . Colon cancer Neg Hx   . Esophageal cancer Neg Hx   . Rectal cancer Neg Hx   . Stomach cancer Neg Hx     Social History   Socioeconomic History  . Marital status: Married    Spouse name: Not on file  . Number of children: 2  . Years of education: Not on file  . Highest education level: Not on file  Occupational History  . Occupation:    . 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 Financial resource strain: Not on file  . Food insecurity:    Worry: Not on file    Inability: Not on file  . Transportation needs:    Medical: Not on file    Non-medical: Not on file  Tobacco Use  . Smoking status: Never Smoker  . Smokeless tobacco: Never Used  Substance and Sexual Activity  . Alcohol use: No    Alcohol/week: 0.0 standard drinks  . Drug use: No  . Sexual activity: Not on file  Lifestyle  . Physical activity:    Days per week: Not on file    Minutes per session: Not on file  . Stress: Not on file  Relationships  . Social connections:    Talks on phone: Not on file    Gets together: Not on file    Attends religious service: Not on file    Active member of club or organization: Not on file    Attends meetings of clubs or organizations: Not on file    Relationship status: Not on file  . Intimate partner violence:    Fear of current or ex partner: Not on file    Emotionally abused: Not on file    Physically abused: Not on file    Forced sexual activity: Not on file  Other Topics Concern  . Not on file  Social History Narrative   Retired as Social research officer, government for [employer]      Review of Systems  Constitutional: Negative for fatigue and unexpected weight change.       Wears seat belt  HENT: Positive for dental problem, hearing loss and tinnitus. Negative for trouble swallowing.         Inconsistent with dentist  Eyes: Negative for visual disturbance.       No diplopia or unilateral vision loss. Needs new prescription  Respiratory: Negative for cough, chest tightness and shortness of breath.   Cardiovascular: Negative for chest pain, palpitations and leg swelling.  Gastrointestinal: Negative for blood in stool and constipation.       No heartburn  Endocrine: Negative for polydipsia and polyuria.  Genitourinary: Negative for difficulty urinating and urgency.       No sexual problems  Musculoskeletal: Positive for arthralgias. Negative for joint swelling.       Right shoulder pain--- no meds  Skin: Negative for rash.  Allergic/Immunologic: Positive for environmental allergies. Negative for immunocompromised state.       Mild symptoms --no meds Recent sinus infection  Neurological: Negative for dizziness, syncope, light-headedness and headaches.  Hematological: Negative for adenopathy. Bruises/bleeds easily.  Psychiatric/Behavioral: Negative for dysphoric mood. The patient is not nervous/anxious.        Not a good sleeper--up at night regularly. Initiates well though Nocturia x 1        Objective:   Physical Exam  Constitutional: He is oriented to person, place, and time. He appears well-developed. No distress.  HENT:  Head: Normocephalic and atraumatic.  Right Ear: External ear normal.  Left Ear: External ear normal.  Mouth/Throat: Oropharynx is clear and moist.  Eyes: Pupils are equal, round, and reactive to light. Conjunctivae are normal.  Neck: No thyromegaly present.  Cardiovascular: Normal rate, regular rhythm, normal heart sounds and intact distal pulses. Exam reveals no gallop.  No murmur heard. Respiratory: Effort normal and breath sounds normal. No respiratory distress. He has no wheezes. He has no rales.  GI: Soft. There is no tenderness.  Musculoskeletal: He exhibits no edema or tenderness.  Lymphadenopathy:    He has no cervical adenopathy.    Neurological: He is alert and oriented to person, place, and time.  Mild resting tremor--more on left today (but he notes it variable)  Skin: No rash noted. No erythema.  Psychiatric: He has a normal mood and affect. His behavior is normal.           Assessment & Plan:

## 2018-08-11 NOTE — Assessment & Plan Note (Signed)
Quiet on meds Keeps up with cardiologist

## 2018-08-11 NOTE — Addendum Note (Signed)
Addended by: Pilar Grammes on: 08/11/2018 11:16 AM   Modules accepted: Orders

## 2018-08-11 NOTE — Assessment & Plan Note (Signed)
Doing okay Discussed fitness and healthier eating (like cutting portion size for his beloved ice cream) Will check PSA after discussion Colon due in December Td booster and flu vaccine

## 2018-10-06 ENCOUNTER — Ambulatory Visit: Payer: Self-pay | Admitting: Neurology

## 2018-12-05 ENCOUNTER — Encounter: Payer: Self-pay | Admitting: Gastroenterology

## 2018-12-29 ENCOUNTER — Encounter: Payer: Self-pay | Admitting: Gastroenterology

## 2019-01-27 ENCOUNTER — Ambulatory Visit: Payer: BC Managed Care – PPO | Admitting: Gastroenterology

## 2019-01-27 ENCOUNTER — Telehealth: Payer: Self-pay

## 2019-01-27 ENCOUNTER — Encounter: Payer: Self-pay | Admitting: Gastroenterology

## 2019-01-27 VITALS — BP 124/86 | HR 76 | Ht 71.0 in | Wt 247.0 lb

## 2019-01-27 DIAGNOSIS — Z7902 Long term (current) use of antithrombotics/antiplatelets: Secondary | ICD-10-CM

## 2019-01-27 DIAGNOSIS — Z86010 Personal history of colonic polyps: Secondary | ICD-10-CM | POA: Diagnosis not present

## 2019-01-27 MED ORDER — NA SULFATE-K SULFATE-MG SULF 17.5-3.13-1.6 GM/177ML PO SOLN: 1.0000 | Freq: Once | ORAL | 0 refills | Status: AC

## 2019-01-27 NOTE — Patient Instructions (Signed)
You have been scheduled for a colonoscopy. Please follow written instructions given to you at your visit today.  Please pick up your prep supplies at the pharmacy within the next 1-3 days. If you use inhalers (even only as needed), please bring them with you on the day of your procedure. Your physician has requested that you go to www.startemmi.com and enter the access code given to you at your visit today. This web site gives a general overview about your procedure. However, you should still follow specific instructions given to you by our office regarding your preparation for the procedure.  Normal BMI (Body Mass Index- based on height and weight) is between 19 and 25. Your BMI today is Body mass index is 34.45 kg/m. Marland Kitchen Please consider follow up  regarding your BMI with your Primary Care Provider.  Thank you for choosing me and [facility].  Pricilla Riffle. Dagoberto Ligas., MD., Marval Regal

## 2019-01-27 NOTE — Telephone Encounter (Signed)
[facility] Pre-operative Risk Assessment     Request for surgical clearance:     Endoscopy Procedure  What type of surgery is being performed?     Colonoscopy  When is this surgery scheduled?     02/21/19  What type of clearance is required ?   Pharmacy  Are there any medications that need to be held prior to surgery and how long? Brilinta x 5 days  Practice name and name of physician performing surgery?      [facility]  What is your office phone and fax number?      Phone- [phone]  [phone]  Anesthesia type (None, local, MAC, general) ?       MAC

## 2019-01-27 NOTE — Progress Notes (Signed)
History of Present Illness: This is a 63 year old male referred by Venia Carbon, MD for the evaluation of a personal history of adenomatous colon polyps.  His last colonoscopy was performed in December 2014.  He has a significant cardiac history with coronary artery disease, history of STEMI and DES. He has been stable for the past 3 years.  He is followed by Dr. Fletcher Anon and maintained on Brilinta and aspirin. Denies weight loss, abdominal pain, constipation, diarrhea, change in stool caliber, melena, hematochezia, nausea, vomiting, dysphagia, reflux symptoms, chest pain.      Allergies  Allergen Reactions  . Lisinopril Cough    REACTION: cough with this and/or benazepril   Outpatient Medications Prior to Visit  Medication Sig Dispense Refill  . aspirin 81 MG tablet Take 1 tablet (81 mg total) by mouth daily. 30 tablet   . atorvastatin (LIPITOR) 80 MG tablet TAKE 1 TABLET BY MOUTH ONCE A DAY AT SIX IN THE EVENING 90 tablet 1  . losartan (COZAAR) 100 MG tablet Take 1 tablet (100 mg total) by mouth daily. 90 tablet 3  . nitroGLYCERIN (NITROSTAT) 0.4 MG SL tablet Place 1 tablet (0.4 mg total) under the tongue every 5 (five) minutes as needed for chest pain. 25 tablet 3  . ticagrelor (BRILINTA) 60 MG TABS tablet Take 1 tablet (60 mg total) by mouth 2 (two) times daily. 180 tablet 1   No facility-administered medications prior to visit.    Past Medical History:  Diagnosis Date  . Arthritis    knee  . Atrial fibrillation ([place]) post MI   . CAD (coronary artery disease)    a. 09/2003 Cath/PCI: LAD 80%->3.0x23 Cypher DES;  b. 03/2004 Cath ;  c. NSTEMI 12/2012 cath patent LAD stent, included distal right PDA supplying a small territory, 99% proximal OM 2 status post PCI and DES placement  d. cath 06/20/2016 anterolateral STEMI stent to LAD, PCTA to diag and LCx  . Colon polyps   . Diverticulosis   . Dysrhythmia   . Gout   . Hemorrhoids   . Hyperlipidemia   . Hypertension   . Internal  hemorrhoids without mention of complication   . Myocardial infarction ([place])    LAST 12/16/12  . Sleep apnea   . ST elevation (STEMI) myocardial infarction involving left anterior descending coronary artery ([place]) 06/20/2016   Past Surgical History:  Procedure Laterality Date  . BACK SURGERY  10/20/2016   Cervical fusion--Dr.Chester Johnnette Gourd  . CARDIAC CATHETERIZATION  2014   s/p stent  . CARDIAC CATHETERIZATION N/A 06/20/2016   Procedure: Left Heart Cath and Coronary Angiography;  Surgeon: Sherren Mocha, MD;  Location: [facility];  Service: Cardiovascular;  Laterality: N/A;  . CARDIAC CATHETERIZATION N/A 06/20/2016   Procedure: Coronary Stent Intervention;  Surgeon: Sherren Mocha, MD;  Location: [facility];  Service: Cardiovascular;  Laterality: N/A;  . CHOLECYSTECTOMY N/A 03/13/2016   Procedure: LAPAROSCOPIC CHOLECYSTECTOMY WITH INTRAOPERATIVE CHOLANGIOGRAM;  Surgeon: Christene Lye, MD;  Location: [facility] ORS;  Service: General;  Laterality: N/A;  . CHOLECYSTECTOMY, LAPAROSCOPIC  03/13/16   Infection - lengthy diagnosis d/t lack of gallstones  . COLONOSCOPY  10/2013  . CORONARY ANGIOPLASTY     STENTS  . Cypher Stent  10/04   mid LAD- gupta  . LEFT HEART CATHETERIZATION WITH CORONARY ANGIOGRAM N/A 12/17/2012   Procedure: LEFT HEART CATHETERIZATION WITH CORONARY ANGIOGRAM;  Surgeon: Peter M Martinique, MD;  Location: [facility] CATH LAB;  Service: Cardiovascular;  Laterality: N/A;  . PERCUTANEOUS CORONARY STENT INTERVENTION (PCI-S) Right 12/17/2012   Procedure: PERCUTANEOUS CORONARY STENT INTERVENTION (PCI-S);  Surgeon: Peter M Martinique, MD;  Location: [facility] CATH LAB;  Service: Cardiovascular;  Laterality: Right;   Social History   Socioeconomic History  . Marital status: Married    Spouse name: Not on file  . Number of children: 2  . Years of education: Not on file  . Highest education level: Not on file  Occupational History  . Occupation:    . 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 Financial resource strain: Not on file  . Food insecurity:    Worry: Not on file    Inability: Not on file  . Transportation needs:    Medical: Not on file    Non-medical: Not on file  Tobacco Use  . Smoking status: Never Smoker  . Smokeless tobacco: Never Used  Substance and Sexual Activity  . Alcohol use: No    Alcohol/week: 0.0 standard drinks  . Drug use: No  . Sexual activity: Yes    Partners: Female  Lifestyle  . Physical activity:    Days per week: Not on file    Minutes per session: Not on file  . Stress: Not on file  Relationships  . Social connections:    Talks on phone: Not on file    Gets together: Not on file    Attends religious service: Not on file    Active member of club or organization: Not on file    Attends meetings of clubs or organizations: Not on file    Relationship status: Not on file  Other Topics Concern  . Not on file  Social History Narrative   Retired as Social research officer, government for [employer]      Family History  Problem Relation Age of Onset  . Diabetes Father   . Coronary artery disease Father        CABG - 5 vessel 15 yr ago  . Heart attack Paternal Uncle        Age 7  . Heart attack Maternal Uncle        Age 36  . Coronary artery disease Maternal Grandfather   . Heart attack Maternal Grandfather        Died in 59s  . Throat cancer Maternal Grandfather   . Hypertension Mother   . Alzheimer's disease Maternal Grandmother   . Hypertension Brother   . Colon cancer Neg Hx   . Esophageal cancer Neg Hx   . Rectal cancer Neg Hx   . Stomach cancer Neg Hx        Review of Systems: Pertinent positive and negative review of systems were noted in the above HPI section. All other review of systems were otherwise negative.    Physical Exam: General: Well developed, well nourished, no acute distress Head: Normocephalic and atraumatic Eyes:  sclerae  anicteric, EOMI Ears: Normal auditory acuity Mouth: No deformity or lesions Neck: Supple, no masses or thyromegaly Lungs: Clear throughout to auscultation Heart: Regular rate and rhythm; no murmurs, rubs or bruits Abdomen: Soft, non tender and non distended. No masses, hepatosplenomegaly or hernias noted. Normal Bowel sounds Rectal: Deferred to colonoscopy Musculoskeletal: Symmetrical with no gross deformities  Skin: No lesions on visible extremities Pulses:  Normal pulses noted Extremities: No clubbing, cyanosis, edema or deformities noted Neurological: Alert oriented x 4, grossly nonfocal Cervical Nodes:  No significant  cervical adenopathy Inguinal Nodes: No significant inguinal adenopathy Psychological:  Alert and cooperative. Normal mood and affect   Assessment and Recommendations:  1. History of adenomatous colon polyps.  Overdue for surveillance colonoscopy.  Schedule colonoscopy. The risks (including bleeding, perforation, infection, missed lesions, medication reactions and possible hospitalization or surgery if complications occur), benefits, and alternatives to colonoscopy with possible biopsy and possible polypectomy were discussed with the patient and they consent to proceed.   2. CAD, DES, S/P STEMI maintained on Brilinta and aspirin. Stable for past few years. Hold Brilinta 5 days before procedure - will instruct when and how to resume after procedure. Low but real risk of cardiovascular event such as heart attack, stroke, embolism, thrombosis or ischemia/infarct of other organs off Brilinta explained and need to seek urgent help if this occurs. The patient consents to proceed. Will communicate by phone or EMR with patient's prescribing provider to confirm that holding Brilinta is reasonable in this case.  He is advised to remain on aspirin daily through the time of the colonoscopy.    cc: Venia Carbon, MD [address] [place], [place] [address]

## 2019-01-28 NOTE — Telephone Encounter (Signed)
Called patient also to verify he was informed to hold Brilinta 5 days prior to his procedure per Cardiology's notes. Patient verbalized understanding.

## 2019-01-28 NOTE — Telephone Encounter (Signed)
   Primary Cardiologist: [name], MD  Chart reviewed as part of pre-operative protocol coverage. Patient was contacted 01/28/2019 in reference to pre-operative risk assessment for pending surgery as outlined below.  Matthew Doyle was last seen on 07/16/18 by Dr. Fletcher Anon.  Since that day, Matthew Doyle has done well. He can complete more than 4.0 METS. He tries to go to the gym three times weekly and walks 3-3.5 miles.   Per Dr. Fletcher Anon: Faythe Ghee to hold Brilinta 5 days before surgery.  Aspirin 81 mg once daily should be continued without interruption.  Therefore, based on ACC/AHA guidelines, the patient would be at acceptable risk for the planned procedure without further cardiovascular testing.   I will route this recommendation to the requesting party via Epic fax function and remove from pre-op pool.  Please call with questions.  [place], PA 01/28/2019, 2:14 PM

## 2019-01-28 NOTE — Telephone Encounter (Signed)
Okay to hold Brilinta 5 days before surgery.  Aspirin 81 mg once daily should be continued without interruption.

## 2019-02-07 ENCOUNTER — Other Ambulatory Visit: Payer: Self-pay | Admitting: Cardiovascular Disease

## 2019-02-21 ENCOUNTER — Encounter: Payer: Self-pay | Admitting: Gastroenterology

## 2019-02-23 ENCOUNTER — Encounter: Payer: Self-pay | Admitting: Gastroenterology

## 2019-03-15 ENCOUNTER — Encounter: Payer: Self-pay | Admitting: Gastroenterology

## 2019-03-22 ENCOUNTER — Telehealth: Payer: Self-pay | Admitting: Cardiovascular Disease

## 2019-03-22 NOTE — Telephone Encounter (Signed)
Virtual Visit Pre-Appointment Phone Call  "(Name), I am calling you today to discuss your upcoming appointment. We are currently trying to limit exposure to the virus that causes COVID-19 by seeing patients at home rather than in the office."  1. "What is the BEST phone number to call the day of the visit?" - include this in appointment notes  2. Do you have or have access to (through a family member/friend) a smartphone with video capability that we can use for your visit?" a. If yes - list this number in appt notes as cell (if different from BEST phone #) and list the appointment type as a VIDEO visit in appointment notes b. If no - list the appointment type as a PHONE visit in appointment notes  3. Confirm consent - "In the setting of the current Covid19 crisis, you are scheduled for a (phone or video) visit with your provider on (date) at (time).  Just as we do with many in-office visits, in order for you to participate in this visit, we must obtain consent.  If you'd like, I can send this to your mychart (if signed up) or email for you to review.  Otherwise, I can obtain your verbal consent now.  All virtual visits are billed to your insurance company just like a normal visit would be.  By agreeing to a virtual visit, we'd like you to understand that the technology does not allow for your provider to perform an examination, and thus may limit your provider's ability to fully assess your condition. If your provider identifies any concerns that need to be evaluated in person, we will make arrangements to do so.  Finally, though the technology is pretty good, we cannot assure that it will always work on either your or our end, and in the setting of a video visit, we may have to convert it to a phone-only visit.  In either situation, we cannot ensure that we have a secure connection.  Are you willing to proceed?" STAFF: Did the patient verbally acknowledge consent to telehealth visit? Document  YES/NO here: yes   4. Advise patient to be prepared - "Two hours prior to your appointment, go ahead and check your blood pressure, pulse, oxygen saturation, and your weight (if you have the equipment to check those) and write them all down. When your visit starts, your provider will ask you for this information. If you have an Apple Watch or Kardia device, please plan to have heart rate information ready on the day of your appointment. Please have a pen and paper handy nearby the day of the visit as well."  5. Give patient instructions for MyChart download to smartphone OR [email or web address] as below if video visit (depending on what platform provider is using)  6. Inform patient they will receive a phone call 15 minutes prior to their appointment time (may be from unknown caller ID) so they should be prepared to answer    TELEPHONE CALL NOTE  Matthew Doyle has been deemed a candidate for a follow-up tele-health visit to limit community exposure during the Covid-19 pandemic. I spoke with the patient via phone to ensure availability of phone/video source, confirm preferred email & phone number, and discuss instructions and expectations.  I reminded Matthew Doyle to be prepared with any vital sign and/or heart rhythm information that could potentially be obtained via home monitoring, at the time of his visit. I reminded Matthew Doyle to expect a phone call prior  to his visit.  Ace Gins 03/22/2019 3:33 PM   INSTRUCTIONS FOR DOWNLOADING THE MYCHART APP TO SMARTPHONE  - The patient must first make sure to have activated MyChart and know their login information - If Apple, go to [employer] and type in MyChart in the search bar and download the app. If Android, ask patient to go to [employer] and type in [place] in the search bar and download the app. The app is free but as with any other app downloads, their phone may require them to verify saved payment information or Apple/Android  password.  - The patient will need to then log into the app with their MyChart username and password, and select [facility] as their healthcare provider to link the account. When it is time for your visit, go to the MyChart app, find appointments, and click Begin Video Visit. Be sure to Select Allow for your device to access the Microphone and Camera for your visit. You will then be connected, and your provider will be with you shortly.  **If they have any issues connecting, or need assistance please contact MyChart service desk [phone])**  **If using a computer, in order to ensure the best quality for their visit they will need to use either of the following Internet Browsers: [employer], or Google Chrome**  IF USING DOXIMITY or DOXY.ME - The patient will receive a link just prior to their visit by text.     FULL LENGTH CONSENT FOR TELE-HEALTH VISIT   I hereby voluntarily request, consent and authorize [facility] and its employed or contracted physicians, physician assistants, nurse practitioners or other licensed health care professionals (the Practitioner), to provide me with telemedicine health care services (the Services") as deemed necessary by the treating Practitioner. I acknowledge and consent to receive the Services by the Practitioner via telemedicine. I understand that the telemedicine visit will involve communicating with the Practitioner through live audiovisual communication technology and the disclosure of certain medical information by electronic transmission. I acknowledge that I have been given the opportunity to request an in-person assessment or other available alternative prior to the telemedicine visit and am voluntarily participating in the telemedicine visit.  I understand that I have the right to withhold or withdraw my consent to the use of telemedicine in the course of my care at any time, without affecting my right to future care or treatment,  and that the Practitioner or I may terminate the telemedicine visit at any time. I understand that I have the right to inspect all information obtained and/or recorded in the course of the telemedicine visit and may receive copies of available information for a reasonable fee.  I understand that some of the potential risks of receiving the Services via telemedicine include:   Delay or interruption in medical evaluation due to technological equipment failure or disruption;  Information transmitted may not be sufficient (e.g. poor resolution of images) to allow for appropriate medical decision making by the Practitioner; and/or   In rare instances, security protocols could fail, causing a breach of personal health information.  Furthermore, I acknowledge that it is my responsibility to provide information about my medical history, conditions and care that is complete and accurate to the best of my ability. I acknowledge that Practitioner's advice, recommendations, and/or decision may be based on factors not within their control, such as incomplete or inaccurate data provided by me or distortions of diagnostic images or specimens that may result from electronic transmissions. I  understand that the practice of medicine is not an exact science and that Practitioner makes no warranties or guarantees regarding treatment outcomes. I acknowledge that I will receive a copy of this consent concurrently upon execution via email to the email address I last provided but may also request a printed copy by calling the office of [facility].    I understand that my insurance will be billed for this visit.   I have read or had this consent read to me.  I understand the contents of this consent, which adequately explains the benefits and risks of the Services being provided via telemedicine.   I have been provided ample opportunity to ask questions regarding this consent and the Services and have had my questions  answered to my satisfaction.  I give my informed consent for the services to be provided through the use of telemedicine in my medical care  By participating in this telemedicine visit I agree to the above.

## 2019-03-31 ENCOUNTER — Encounter: Payer: Self-pay | Admitting: Cardiovascular Disease

## 2019-03-31 ENCOUNTER — Other Ambulatory Visit: Payer: Self-pay

## 2019-03-31 ENCOUNTER — Telehealth (INDEPENDENT_AMBULATORY_CARE_PROVIDER_SITE_OTHER): Payer: BC Managed Care – PPO | Admitting: Cardiovascular Disease

## 2019-03-31 VITALS — BP 160/93 | HR 65 | Ht 71.0 in | Wt 240.0 lb

## 2019-03-31 DIAGNOSIS — I10 Essential (primary) hypertension: Secondary | ICD-10-CM

## 2019-03-31 DIAGNOSIS — I2510 Atherosclerotic heart disease of native coronary artery without angina pectoris: Secondary | ICD-10-CM | POA: Diagnosis not present

## 2019-03-31 NOTE — Progress Notes (Signed)
Virtual Visit via Video Note   This visit type was conducted due to national recommendations for restrictions regarding the COVID-19 Pandemic (e.g. social distancing) in an effort to limit this patient's exposure and mitigate transmission in our community.  Due to his co-morbid illnesses, this patient is at least at moderate risk for complications without adequate follow up.  This format is felt to be most appropriate for this patient at this time.  All issues noted in this document were discussed and addressed.  A limited physical exam was performed with this format.  Please refer to the patient's chart for his consent to telehealth for [facility].   Evaluation Performed:  Follow-up visit  Date:  03/31/2019   ID:  Matthew Doyle, Matthew Doyle 10-12-1956, MRN [id number]  Patient Location: Home Provider Location: Office  PCP:  Venia Carbon, MD  Cardiologist:  [name], MD  Electrophysiologist:  None   Chief Complaint: 36-month follow-up visit  History of Present Illness:    Matthew Doyle is a 63 y.o. male who was seen today via video for a follow-up visit regarding coronary artery disease.  He is s/p angioplasty and drug-eluting stent placement to the LAD in 2004 and PCI and drug-eluting stent placement to OM 2 in January 2014 after a small non-ST elevation myocardial infarction. Cardiac catheterization at that time showed patent LAD stent, occluded right PDA distally supplying a small territory and 99% proximal OM 2 stenosis .  Ejection fraction was normal. He presented in July, 2017 with anterolateral ST elevation myocardial infarction complicated by ventricular fibrillation. Cardiac catheterization which showed subtotal thrombotic occlusion of proximal LAD at the diagonal bifurcation and also thrombus in the mid left circumflex. He underwent aspiration thrombectomy of the diagonal and left circumflex and drug-eluting stent placement to the proximal LAD. LV gram showed an ejection  fraction of 45% but subsequent echo showed normal LV systolic function.  He had cervical myelopathy and underwent cervical spine surgery in November 2017.  He has essential tremors During last visit, increase losartan to 100 mg once daily.  He has been doing very well with no chest pain, shortness of breath or palpitation. His blood pressure was elevated but it was rechecked manually by his wife and it was 130/80.   The patient does not have symptoms concerning for COVID-19 infection (fever, chills, cough, or new shortness of breath).    Past Medical History:  Diagnosis Date  . Arthritis    knee  . Atrial fibrillation ([place]) post MI   . CAD (coronary artery disease)    a. 09/2003 Cath/PCI: LAD 80%->3.0x23 Cypher DES;  b. 03/2004 Cath ;  c. NSTEMI 12/2012 cath patent LAD stent, included distal right PDA supplying a small territory, 99% proximal OM 2 status post PCI and DES placement  d. cath 06/20/2016 anterolateral STEMI stent to LAD, PCTA to diag and LCx  . Colon polyps   . Diverticulosis   . Dysrhythmia   . Gout   . Hemorrhoids   . Hyperlipidemia   . Hypertension   . Internal hemorrhoids without mention of complication   . Myocardial infarction ([place])    LAST 12/16/12  . Sleep apnea   . ST elevation (STEMI) myocardial infarction involving left anterior descending coronary artery ([place]) 06/20/2016   Past Surgical History:  Procedure Laterality Date  . BACK SURGERY  10/20/2016   Cervical fusion--Dr.Chester Johnnette Gourd  . CARDIAC CATHETERIZATION  2014   s/p stent  . CARDIAC CATHETERIZATION N/A 06/20/2016  Procedure: Left Heart Cath and Coronary Angiography;  Surgeon: Sherren Mocha, MD;  Location: [place] [facility];  Service: Cardiovascular;  Laterality: N/A;  . CARDIAC CATHETERIZATION N/A 06/20/2016   Procedure: Coronary Stent Intervention;  Surgeon: Sherren Mocha, MD;  Location: [place] [facility];  Service: Cardiovascular;  Laterality: N/A;  . CHOLECYSTECTOMY N/A 03/13/2016    Procedure: LAPAROSCOPIC CHOLECYSTECTOMY WITH INTRAOPERATIVE CHOLANGIOGRAM;  Surgeon: Christene Lye, MD;  Location: [facility] ORS;  Service: General;  Laterality: N/A;  . CHOLECYSTECTOMY, LAPAROSCOPIC  03/13/16   Infection - lengthy diagnosis d/t lack of gallstones  . COLONOSCOPY  10/2013  . CORONARY ANGIOPLASTY     STENTS  . Cypher Stent  10/04   mid LAD- gupta  . LEFT HEART CATHETERIZATION WITH CORONARY ANGIOGRAM N/A 12/17/2012   Procedure: LEFT HEART CATHETERIZATION WITH CORONARY ANGIOGRAM;  Surgeon: Peter M Martinique, MD;  Location: [facility] CATH LAB;  Service: Cardiovascular;  Laterality: N/A;  . PERCUTANEOUS CORONARY STENT INTERVENTION (PCI-S) Right 12/17/2012   Procedure: PERCUTANEOUS CORONARY STENT INTERVENTION (PCI-S);  Surgeon: Peter M Martinique, MD;  Location: [facility] CATH LAB;  Service: Cardiovascular;  Laterality: Right;     Current Meds  Medication Sig  . aspirin 81 MG tablet Take 1 tablet (81 mg total) by mouth daily.  Marland Kitchen atorvastatin (LIPITOR) 80 MG tablet TAKE 1 TABLET BY MOUTH ONCE DAILY AT [facility] THE EVENING  . BRILINTA 60 MG TABS tablet TAKE 1 TABLET BY MOUTH TWICE (2) DAILY  . losartan (COZAAR) 100 MG tablet Take 1 tablet (100 mg total) by mouth daily.  . nitroGLYCERIN (NITROSTAT) 0.4 MG SL tablet Place 1 tablet (0.4 mg total) under the tongue every 5 (five) minutes as needed for chest pain.     Allergies:   Lisinopril   Social History   Tobacco Use  . Smoking status: Never Smoker  . Smokeless tobacco: Never Used  Substance Use Topics  . Alcohol use: No    Alcohol/week: 0.0 standard drinks  . Drug use: No     Family Hx: The patient's family history includes Alzheimer's disease in his maternal grandmother; Coronary artery disease in his father and maternal grandfather; Diabetes in his father; Heart attack in his maternal grandfather, maternal uncle, and paternal uncle; Hypertension in his brother and mother; Throat cancer in his maternal grandfather. There is no history of Colon  cancer, Esophageal cancer, Rectal cancer, or Stomach cancer.  ROS:   Please see the history of present illness.     All other systems reviewed and are negative.   Prior CV studies:   The following studies were reviewed today:    Labs/Other Tests and Data Reviewed:    EKG:  No ECG reviewed.  Recent Labs: 08/11/2018: ALT 21; BUN 19; Creatinine, Ser 0.99; Hemoglobin 15.8; Platelets 214.0; Potassium 4.5; Sodium 139   Recent Lipid Panel Lab Results  Component Value Date/Time   CHOL 96 08/11/2018 11:08 AM   CHOL 91 (L) 03/31/2013 09:14 AM   TRIG 52.0 08/11/2018 11:08 AM   HDL 39.30 08/11/2018 11:08 AM   HDL 36 (L) 03/31/2013 09:14 AM   CHOLHDL 2 08/11/2018 11:08 AM   LDLCALC 46 08/11/2018 11:08 AM   LDLCALC 47 03/31/2013 09:14 AM    Wt Readings from Last 3 Encounters:  03/31/19 240 lb (108.9 kg)  01/27/19 247 lb (112 kg)  08/11/18 235 lb (106.6 kg)     Objective:    Vital Signs:  BP (!) 160/93   Pulse 65   Ht 5\' 11"  (1.803  m)   Wt 240 lb (108.9 kg)   BMI 33.47 kg/m    VITAL SIGNS:  reviewed GEN:  no acute distress EYES:  sclerae anicteric, EOMI - Extraocular Movements Intact RESPIRATORY:  normal respiratory effort, symmetric expansion CARDIOVASCULAR:  no peripheral edema SKIN:  no rash, lesions or ulcers. MUSCULOSKELETAL:  no obvious deformities. NEURO:  alert and oriented x 3, no obvious focal deficit PSYCH:  normal affect  ASSESSMENT & PLAN:    1.  Coronary artery disease involving native coronary arteries without angina: He is doing well overall with no anginal symptoms. Continue medical therapy.  Continue lifelong dual antiplatelet therapy.    2. Essential hypertension: Blood pressure was elevated with a blood pressure machine but normal manually.  Continue losartan for now.  I asked him to continue periodic monitoring of blood pressure and we can consider adding amlodipine if needed.  He tends to have intermittent bradycardia and thus is not on a  beta-blocker.  3. Hyperlipidemia: Continue high dose atorvastatin .  Most recent LDL was 47.  4.  Sleep apnea: Currently on CPAP.   COVID-19 Education: The signs and symptoms of COVID-19 were discussed with the patient and how to seek care for testing (follow up with PCP or arrange E-visit).  The importance of social distancing was discussed today.  Time:   Today, I have spent 18 minutes with the patient with telehealth technology discussing the above problems.     Medication Adjustments/Labs and Tests Ordered: Current medicines are reviewed at length with the patient today.  Concerns regarding medicines are outlined above.   Tests Ordered: No orders of the defined types were placed in this encounter.   Medication Changes: No orders of the defined types were placed in this encounter.   Disposition:  Follow up in 6 month(s)  Signed, [name], MD  03/31/2019 11:51 AM    [facility]

## 2019-03-31 NOTE — Patient Instructions (Signed)
Medication Instructions:  Continue same medications If you need a refill on your cardiac medications before your next appointment, please call your pharmacy.   Lab work: None If you have labs (blood work) drawn today and your tests are completely normal, you will receive your results only by: . MyChart Message (if you have MyChart) OR . A paper copy in the mail If you have any lab test that is abnormal or we need to change your treatment, we will call you to review the results.  Testing/Procedures: None  Follow-Up: At [facility], you and your health needs are our priority.  As part of our continuing mission to provide you with exceptional heart care, we have created designated Provider Care Teams.  These Care Teams include your primary Cardiologist (physician) and Advanced Practice Providers (APPs -  Physician Assistants and Nurse Practitioners) who all work together to provide you with the care you need, when you need it. You will need a follow up appointment in 6 months.  Please call our office 2 months in advance to schedule this appointment.  You may see Jaidynn Balster, MD or one of the following Advanced Practice Providers on your designated Care Team:   Christopher Berge, NP Ryan Dunn, PA-C . Jacquelyn Visser, PA-C   

## 2019-04-04 ENCOUNTER — Telehealth: Payer: Self-pay | Admitting: *Deleted

## 2019-04-04 NOTE — Telephone Encounter (Signed)
Phoned patient and rescheduled previously cancelled colonoscopy due to Covid-19. Pt has prep. Instructions redone and mailed to patient. Advised to bring mask and wear into building. Advised of care partner restrictions.

## 2019-04-11 ENCOUNTER — Telehealth: Payer: Self-pay | Admitting: *Deleted

## 2019-04-11 NOTE — Telephone Encounter (Signed)
Covid-19 travel screening questions  Have you traveled in the last 14 days? No If yes where?  Do you now or have you had a fever in the last 14 days? No  Do you have any respiratory symptoms of shortness of breath or cough now or in the last 14 days? No  Do you have any family members or close contacts with diagnosed or suspected Covid-19? No       

## 2019-04-12 ENCOUNTER — Other Ambulatory Visit: Payer: Self-pay

## 2019-04-12 ENCOUNTER — Encounter: Payer: Self-pay | Admitting: Gastroenterology

## 2019-04-12 ENCOUNTER — Ambulatory Visit (AMBULATORY_SURGERY_CENTER): Payer: BC Managed Care – PPO | Admitting: Gastroenterology

## 2019-04-12 VITALS — BP 150/90 | HR 57 | Temp 98.6°F | Resp 20 | Ht 71.0 in | Wt 247.0 lb

## 2019-04-12 DIAGNOSIS — Z860101 Personal history of adenomatous and serrated colon polyps: Secondary | ICD-10-CM

## 2019-04-12 DIAGNOSIS — D122 Benign neoplasm of ascending colon: Secondary | ICD-10-CM

## 2019-04-12 DIAGNOSIS — Z86010 Personal history of colonic polyps: Secondary | ICD-10-CM

## 2019-04-12 MED ORDER — SODIUM CHLORIDE 0.9 % IV SOLN: 500.0000 mL | Freq: Once | INTRAVENOUS | Status: DC

## 2019-04-12 NOTE — Progress Notes (Signed)
Pt's states no medical or surgical changes since previsit or office visit. 

## 2019-04-12 NOTE — Op Note (Signed)
[place] Patient Name: Matthew Doyle Procedure Date: 04/12/2019 12:03 PM MRN: [id number] Endoscopist: Ladene Artist , MD Age: 63 Referring MD:  Date of Birth: [birth year] Gender: Male Account #: [id number] Procedure:                Colonoscopy Indications:              Surveillance: Personal history of adenomatous                            polyps on last colonoscopy > 5 years ago Medicines:                Monitored Anesthesia Care Procedure:                Pre-Anesthesia Assessment:                           - Prior to the procedure, a History and Physical                            was performed, and patient medications and                            allergies were reviewed. The patient's tolerance of                            previous anesthesia was also reviewed. The risks                            and benefits of the procedure and the sedation                            options and risks were discussed with the patient.                            All questions were answered, and informed consent                            was obtained. Prior Anticoagulants: The patient has                            taken antiplatelet medication, Bri last dose was 5                            days prior to procedure. ASA Grade Assessment: II -                            A patient with mild systemic disease. After                            reviewing the risks and benefits, the patient was                            deemed in satisfactory condition to undergo the  procedure.                           After obtaining informed consent, the colonoscope                            was passed under direct vision. Throughout the                            procedure, the patient's blood pressure, pulse, and                            oxygen saturations were monitored continuously. The                            Colonoscope was introduced through the anus and                          advanced to the the cecum, identified by                            appendiceal orifice and ileocecal valve. The                            ileocecal valve, appendiceal orifice, and rectum                            were photographed. The quality of the bowel                            preparation was good after extensive lavage and                            suctioning. The colonoscopy was performed without                            difficulty. The patient tolerated the procedure                            well. Scope In: 12:10:50 PM Scope Out: 12:24:51 PM Scope Withdrawal Time: 0 hours 11 minutes 57 seconds  Total Procedure Duration: 0 hours 14 minutes 1 second  Findings:                 The perianal and digital rectal examinations were                            normal.                           A 6 mm polyp was found in the ascending colon. The                            polyp was sessile. The polyp was removed with a  cold snare. Resection and retrieval were complete.                           There was a medium-sized lipoma, 15 mm in diameter,                            in the ascending colon.                           Multiple medium-mouthed diverticula were found in                            the left colon. There was no evidence of                            diverticular bleeding.                           Internal hemorrhoids were found during                            retroflexion. The hemorrhoids were small and Grade                            I (internal hemorrhoids that do not prolapse).                           The exam was otherwise without abnormality on                            direct and retroflexion views. Complications:            No immediate complications. Estimated blood loss:                            None. Estimated Blood Loss:     Estimated blood loss: none. Impression:               - One 6 mm polyp in  the ascending colon, removed                            with a cold snare. Resected and retrieved.                           - Medium-sized lipoma in the ascending colon.                           - Moderate diverticulosis in the left colon.                           - Internal hemorrhoids.                           - The examination was otherwise normal on direct  and retroflexion views. Recommendation:           - Repeat colonoscopy date to be determined after                            pending pathology results are reviewed for                            surveillance.                           - Resume previous antiplatelet medication,                            Brilinta, tomorrow at prior dose. Refer to managing                            physician for further adjustment of therapy.                           - Patient has a contact number available for                            emergencies. The signs and symptoms of potential                            delayed complications were discussed with the                            patient. Return to normal activities tomorrow.                            Written discharge instructions were provided to the                            patient.                           - High fiber diet.                           - Continue present medications.                           - Await pathology results. Ladene Artist, MD 04/12/2019 12:31:17 PM This report has been signed electronically.

## 2019-04-12 NOTE — Progress Notes (Signed)
Report given to PACU, vss 

## 2019-04-12 NOTE — Patient Instructions (Addendum)
YOU HAD AN ENDOSCOPIC PROCEDURE TODAY AT [facility]:   Refer to the procedure report that was given to you for any specific questions about what was found during the examination.  If the procedure report does not answer your questions, please call your gastroenterologist to clarify.  If you requested that your care partner not be given the details of your procedure findings, then the procedure report has been included in a sealed envelope for you to review at your convenience later.  YOU SHOULD EXPECT: Some feelings of bloating in the abdomen. Passage of more gas than usual.  Walking can help get rid of the air that was put into your GI tract during the procedure and reduce the bloating. If you had a lower endoscopy (such as a colonoscopy or flexible sigmoidoscopy) you may notice spotting of blood in your stool or on the toilet paper. If you underwent a bowel prep for your procedure, you may not have a normal bowel movement for a few days.  Please Note:  You might notice some irritation and congestion in your nose or some drainage.  This is from the oxygen used during your procedure.  There is no need for concern and it should clear up in a day or so.   Resume your brilinda tomorrow per dr stark.  SYMPTOMS TO REPORT IMMEDIATELY:   Following lower endoscopy (colonoscopy or flexible sigmoidoscopy):  Excessive amounts of blood in the stool  Significant tenderness or worsening of abdominal pains  Swelling of the abdomen that is new, acute  Fever of 100F or higher   Black, tarry-looking stools  For urgent or emergent issues, a gastroenterologist can be reached at any hour by calling [phone].   DIET:  We do recommend a small meal at first, but then you may proceed to your regular diet.  Drink plenty of fluids but you should avoid alcoholic beverages for 24 hours.  ACTIVITY:  You should plan to take it easy for the rest of today and you should NOT DRIVE or use heavy machinery  until tomorrow (because of the sedation medicines used during the test).    FOLLOW UP: Our staff will call the number listed on your records the next business day following your procedure to check on you and address any questions or concerns that you may have regarding the information given to you following your procedure. If we do not reach you, we will leave a message.  However, if you are feeling well and you are not experiencing any problems, there is no need to return our call.  We will assume that you have returned to your regular daily activities without incident. We will be calling you two weeks following your procedure to see if you have developed any symptoms of the COVID-19.  If you develop any symptoms before then, please let us know.  If any biopsies were taken you will be contacted by phone or by letter within the next 1-3 weeks.  Please call us at [phone] if you have not heard about the biopsies in 3 weeks.    SIGNATURES/CONFIDENTIALITY: You and/or your care partner have signed paperwork which will be entered into your electronic medical record.  These signatures attest to the fact that that the information above on your After Visit Summary has been reviewed and is understood.  Full responsibility of the confidentiality of this discharge information lies with you and/or your care-partner.  Read all handouts given to you by your recovery  room nurse.

## 2019-04-12 NOTE — Progress Notes (Signed)
Called to room to assist during endoscopic procedure.  Patient ID and intended procedure confirmed with present staff. Received instructions for my participation in the procedure from the performing physician.  

## 2019-04-14 ENCOUNTER — Telehealth: Payer: Self-pay | Admitting: *Deleted

## 2019-04-14 NOTE — Telephone Encounter (Signed)
Pt returned call, pls call him again. °

## 2019-04-14 NOTE — Telephone Encounter (Signed)
  Follow up Call-  Call back number 04/12/2019  Post procedure Call Back phone  # [phone]  Permission to leave phone message Yes  Some recent data might be hidden     Patient questions:  Do you have a fever, pain , or abdominal swelling? No. Pain Score  0 *  Have you tolerated food without any problems? Yes.    Have you been able to return to your normal activities? Yes.    Do you have any questions about your discharge instructions: Diet   No. Medications  No. Follow up visit  No.  Do you have questions or concerns about your Care? No.  Actions: * If pain score is 4 or above: No action needed, pain <4.  1. Have you developed a fever since your procedure? No   2.   Have you had an respiratory symptoms (SOB or cough) since your procedure? no  3.   Have you tested positive for COVID 19 since your procedure no  3.   Have you had any family members/close contacts diagnosed with the COVID 19 since your procedure?  no   If any of these questions are a yes, please inquire if patient has been seen by family doctor and route this note to Joylene John, Therapist, sports.

## 2019-04-19 ENCOUNTER — Encounter: Payer: Self-pay | Admitting: Gastroenterology

## 2019-08-12 ENCOUNTER — Other Ambulatory Visit: Payer: Self-pay | Admitting: Cardiovascular Disease

## 2019-08-17 ENCOUNTER — Other Ambulatory Visit: Payer: Self-pay

## 2019-08-17 ENCOUNTER — Ambulatory Visit (INDEPENDENT_AMBULATORY_CARE_PROVIDER_SITE_OTHER): Payer: BC Managed Care – PPO | Admitting: Internal Medicine

## 2019-08-17 ENCOUNTER — Encounter: Payer: Self-pay | Admitting: Internal Medicine

## 2019-08-17 VITALS — BP 138/90 | HR 58 | Temp 97.8°F | Ht 70.5 in | Wt 227.0 lb

## 2019-08-17 DIAGNOSIS — I2510 Atherosclerotic heart disease of native coronary artery without angina pectoris: Secondary | ICD-10-CM

## 2019-08-17 DIAGNOSIS — I10 Essential (primary) hypertension: Secondary | ICD-10-CM

## 2019-08-17 DIAGNOSIS — Z23 Encounter for immunization: Secondary | ICD-10-CM

## 2019-08-17 DIAGNOSIS — Z0000 Encounter for general adult medical examination without abnormal findings: Secondary | ICD-10-CM | POA: Diagnosis not present

## 2019-08-17 DIAGNOSIS — G20 Parkinson's disease: Secondary | ICD-10-CM

## 2019-08-17 LAB — COMPREHENSIVE METABOLIC PANEL
ALT: 15 U/L (ref 0–53)
AST: 15 U/L (ref 0–37)
Albumin: 4.2 g/dL (ref 3.5–5.2)
Alkaline Phosphatase: 66 U/L (ref 39–117)
BUN: 17 mg/dL (ref 6–23)
CO2: 29 mEq/L (ref 19–32)
Calcium: 9.7 mg/dL (ref 8.4–10.5)
Chloride: 105 mEq/L (ref 96–112)
Creatinine, Ser: 1.12 mg/dL (ref 0.40–1.50)
GFR: 66.02 mL/min (ref >60.00)
Glucose, Bld: 109 mg/dL — ABNORMAL HIGH (ref 70–99)
Potassium: 4.3 mEq/L (ref 3.5–5.1)
Sodium: 141 mEq/L (ref 135–145)
Total Bilirubin: 1.2 mg/dL (ref 0.2–1.2)
Total Protein: 7 g/dL (ref 6.0–8.3)

## 2019-08-17 LAB — LIPID PANEL
Cholesterol: 95 mg/dL (ref 0–200)
HDL: 35.1 mg/dL — ABNORMAL LOW (ref >39.00)
LDL Cholesterol: 50 mg/dL (ref 0–99)
NonHDL: 59.61
Total CHOL/HDL Ratio: 3
Triglycerides: 50 mg/dL (ref 0.0–149.0)
VLDL: 10 mg/dL (ref 0.0–40.0)

## 2019-08-17 LAB — CBC
HCT: 43.6 % (ref 39.0–52.0)
Hemoglobin: 14.5 g/dL (ref 13.0–17.0)
MCHC: 33.4 g/dL (ref 30.0–36.0)
MCV: 86.1 fl (ref 78.0–100.0)
Platelets: 197 10*3/uL (ref 150.0–400.0)
RBC: 5.06 Mil/uL (ref 4.22–5.81)
RDW: 14.1 % (ref 11.5–15.5)
WBC: 7.6 10*3/uL (ref 4.0–10.5)

## 2019-08-17 NOTE — Assessment & Plan Note (Signed)
BP Readings from Last 3 Encounters:  08/17/19 138/90  04/12/19 (!) 150/90  03/31/19 (!) 160/93   Good control Due for labs

## 2019-08-17 NOTE — Assessment & Plan Note (Signed)
Generally healthy--discussed fitness Flu vaccine today Colon due 2027 Defer PSA to next year

## 2019-08-17 NOTE — Assessment & Plan Note (Signed)
No progression Follow up with Dr Tat only if worsens

## 2019-08-17 NOTE — Assessment & Plan Note (Signed)
Quiet on Rx 

## 2019-08-17 NOTE — Progress Notes (Signed)
Subjective:    Patient ID: Matthew Doyle, male    DOB: [birth year], 63 y.o.   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 aspirin 81 MG tablet Take 1 tablet (81 mg total) by mouth daily. 30 tablet   . atorvastatin (LIPITOR) 80 MG tablet TAKE 1 TABLET BY MOUTH ONCE A DAY AT [facility] THE EVENING. 90 tablet 0  . BRILINTA 60 MG TABS tablet TAKE 1 TABLET BY MOUTH TWICE A DAY 180 tablet 0  . losartan (COZAAR) 100 MG tablet TAKE 1 TABLET BY MOUTH ONCE DAILY 90 tablet 0  . nitroGLYCERIN (NITROSTAT) 0.4 MG SL tablet Place 1 tablet (0.4 mg total) under the tongue every 5 (five) minutes as needed for chest pain. 25 tablet 3   No current facility-administered medications on file prior to visit.     Allergies  Allergen Reactions  . Lisinopril Cough    REACTION: cough with this and/or benazepril    Past Medical History:  Diagnosis Date  . Arthritis    knee  . Atrial fibrillation ([place]) post MI   . CAD (coronary artery disease)    a. 09/2003 Cath/PCI: LAD 80%->3.0x23 Cypher DES;  b. 03/2004 Cath ;  c. NSTEMI 12/2012 cath patent LAD stent, included distal right PDA supplying a small territory, 99% proximal OM 2 status post PCI and DES placement  d. cath 06/20/2016 anterolateral STEMI stent to LAD, PCTA to diag and LCx  . Colon polyps   . Diverticulosis   . Dysrhythmia   . Gout   . Hemorrhoids   . Hyperlipidemia   . Hypertension   . Internal hemorrhoids without mention of complication   . Myocardial infarction ([place])    LAST 12/16/12  . Sleep apnea   . ST elevation (STEMI) myocardial infarction involving left anterior descending  coronary artery ([place]) 06/20/2016    Past Surgical History:  Procedure Laterality Date  . BACK SURGERY  10/20/2016   Cervical fusion--Dr.Chester Johnnette Gourd  . CARDIAC CATHETERIZATION  2014   s/p stent  . CARDIAC CATHETERIZATION N/A 06/20/2016   Procedure: Left Heart Cath and Coronary Angiography;  Surgeon: Sherren Mocha, MD;  Location: [place] [facility];  Service: Cardiovascular;  Laterality: N/A;  . CARDIAC CATHETERIZATION N/A 06/20/2016   Procedure: Coronary Stent Intervention;  Surgeon: Sherren Mocha, MD;  Location: [place] [facility];  Service: Cardiovascular;  Laterality: N/A;  . CHOLECYSTECTOMY N/A 03/13/2016   Procedure: LAPAROSCOPIC CHOLECYSTECTOMY WITH INTRAOPERATIVE CHOLANGIOGRAM;  Surgeon: Christene Lye, MD;  Location: [facility] ORS;  Service: General;  Laterality: N/A;  . CHOLECYSTECTOMY, LAPAROSCOPIC  03/13/16   Infection - lengthy diagnosis d/t lack of gallstones  . COLONOSCOPY  10/2013  . CORONARY ANGIOPLASTY     STENTS  . Cypher Stent  10/04   mid LAD- gupta  . LEFT HEART CATHETERIZATION WITH CORONARY ANGIOGRAM N/A 12/17/2012   Procedure: LEFT HEART CATHETERIZATION WITH CORONARY ANGIOGRAM;  Surgeon: Peter M Martinique, MD;  Location: [facility] CATH LAB;  Service: Cardiovascular;  Laterality: N/A;  . PERCUTANEOUS CORONARY STENT INTERVENTION (PCI-S) Right 12/17/2012   Procedure: PERCUTANEOUS CORONARY STENT INTERVENTION (PCI-S);  Surgeon: Ander Slade  Martinique, MD;  Location: [facility] CATH LAB;  Service: Cardiovascular;  Laterality: Right;    Family History  Problem Relation Age of Onset  . Diabetes Father   . Coronary artery disease Father        CABG - 5 vessel 15 yr ago  . Heart attack Paternal Uncle        Age 26  . Heart attack Maternal Uncle        Age 47  . Coronary artery disease Maternal Grandfather   . Heart attack Maternal Grandfather        Died in 63s  . Throat cancer Maternal Grandfather   . Hypertension Mother   . Alzheimer's disease Maternal Grandmother   . Hypertension  Brother   . Colon cancer Neg Hx   . Esophageal cancer Neg Hx   . Rectal cancer Neg Hx   . Stomach cancer Neg Hx     Social History   Socioeconomic History  . Marital status: Married    Spouse name: Not on file  . Number of children: 2  . Years of education: Not on file  . Highest education level: Not on file  Occupational History  . Occupation: Writer    Comment: part time  . 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 Financial resource strain: Not on file  . Food insecurity    Worry: Not on file    Inability: Not on file  . Transportation needs    Medical: Not on file    Non-medical: Not on file  Tobacco Use  . Smoking status: Never Smoker  . Smokeless tobacco: Never Used  Substance and Sexual Activity  . Alcohol use: No    Alcohol/week: 0.0 standard drinks  . Drug use: No  . Sexual activity: Yes    Partners: Female  Lifestyle  . Physical activity    Days per week: Not on file    Minutes per session: Not on file  . Stress: Not on file  Relationships  . Social Herbalist on phone: Not on file    Gets together: Not on file    Attends religious service: Not on file    Active member of club or organization: Not on file    Attends meetings of clubs or organizations: Not on file    Relationship status: Not on file  . Intimate partner violence    Fear of current or ex partner: Not on file    Emotionally abused: Not on file    Physically abused: Not on file    Forced sexual activity: Not on file  Other Topics Concern  . Not on file  Social History Narrative   Retired as Social research officer, government for [employer]      Review of Systems  Constitutional: Negative for fatigue.       Gained some weight--but now down from last year Not able to go to gym--tries to stay active Wears seat belt  HENT: Positive for hearing loss and tinnitus.        Bad teeth chronically--- dentist prn  Eyes:  Negative for visual disturbance.       No diplopia or unilateral vision loss---but his eyes are changing  Respiratory: Negative for cough, chest tightness and shortness of breath.   Cardiovascular: Negative for chest pain, palpitations and leg swelling.  Gastrointestinal: Positive for abdominal pain. Negative for blood in stool.  Will get occasional flares of LLQ pain--?from diverticulosis Did improve with laxatives No heartburn  Endocrine: Negative for polydipsia and polyuria.  Genitourinary: Negative for difficulty urinating and urgency.       Nocturia x 1-2 No sexual problems  Musculoskeletal: Positive for arthralgias. Negative for back pain and joint swelling.       Some knee pain again--especially the right  Skin: Negative for rash.       No suspicious lesions  Allergic/Immunologic: Negative for environmental allergies and immunocompromised state.  Neurological: Negative for dizziness, syncope, light-headedness and headaches.  Hematological: Negative for adenopathy. Does not bruise/bleed easily.  Psychiatric/Behavioral: Negative for dysphoric mood and sleep disturbance. The patient is not nervous/anxious.        Objective:   Physical Exam  Constitutional: He is oriented to person, place, and time. He appears well-developed. No distress.  HENT:  Head: Normocephalic and atraumatic.  Right Ear: External ear normal.  Left Ear: External ear normal.  Mouth/Throat: Oropharynx is clear and moist. No oropharyngeal exudate.  Eyes: Pupils are equal, round, and reactive to light. Conjunctivae are normal.  Neck: No thyromegaly present.  Cardiovascular: Normal rate, regular rhythm, normal heart sounds and intact distal pulses. Exam reveals no gallop.  No murmur heard. Respiratory: Effort normal and breath sounds normal. No respiratory distress. He has no wheezes. He has no rales.  GI: Soft. There is no abdominal tenderness.  Musculoskeletal:        General: No tenderness or edema.   Lymphadenopathy:    He has no cervical adenopathy.  Neurological: He is alert and oriented to person, place, and time.  Mild resting tremor--mostly right hand  Skin: No rash noted. No erythema.  Psychiatric: He has a normal mood and affect. His behavior is normal.           Assessment & Plan:

## 2019-11-18 ENCOUNTER — Other Ambulatory Visit: Payer: Self-pay | Admitting: Cardiovascular Disease

## 2019-12-09 ENCOUNTER — Ambulatory Visit: Payer: BC Managed Care – PPO | Admitting: Cardiovascular Disease

## 2020-01-20 ENCOUNTER — Other Ambulatory Visit: Payer: Self-pay

## 2020-01-20 ENCOUNTER — Encounter: Payer: Self-pay | Admitting: Cardiovascular Disease

## 2020-01-20 ENCOUNTER — Ambulatory Visit: Payer: BC Managed Care – PPO | Admitting: Cardiovascular Disease

## 2020-01-20 VITALS — BP 128/82 | HR 58 | Ht 71.0 in | Wt 247.8 lb

## 2020-01-20 DIAGNOSIS — E7800 Pure hypercholesterolemia, unspecified: Secondary | ICD-10-CM | POA: Diagnosis not present

## 2020-01-20 DIAGNOSIS — I2510 Atherosclerotic heart disease of native coronary artery without angina pectoris: Secondary | ICD-10-CM

## 2020-01-20 DIAGNOSIS — I10 Essential (primary) hypertension: Secondary | ICD-10-CM | POA: Diagnosis not present

## 2020-01-20 MED ORDER — ATORVASTATIN CALCIUM 80 MG PO TABS: ORAL_TABLET | ORAL | 3 refills | Status: DC

## 2020-01-20 MED ORDER — TICAGRELOR 60 MG PO TABS: ORAL_TABLET | ORAL | 3 refills | Status: DC

## 2020-01-20 MED ORDER — NITROGLYCERIN 0.4 MG SL SUBL: 0.4000 mg | SUBLINGUAL_TABLET | SUBLINGUAL | 1 refills | Status: DC | PRN

## 2020-01-20 MED ORDER — LOSARTAN POTASSIUM 100 MG PO TABS: 100.0000 mg | ORAL_TABLET | Freq: Every day | ORAL | 3 refills | Status: DC

## 2020-01-20 NOTE — Patient Instructions (Signed)
Medication Instructions:  Your physician recommends that you continue on your current medications as directed. Please refer to the Current Medication list given to you today.  Your cardiac medication have been refilled.  *If you need a refill on your cardiac medications before your next appointment, please call your pharmacy*  Lab Work: None ordered If you have labs (blood work) drawn today and your tests are completely normal, you will receive your results only by: Marland Kitchen MyChart Message (if you have MyChart) OR . A paper copy in the mail If you have any lab test that is abnormal or we need to change your treatment, we will call you to review the results.  Testing/Procedures: None ordered  Follow-Up: At [facility], you and your health needs are our priority.  As part of our continuing mission to provide you with exceptional heart care, we have created designated Provider Care Teams.  These Care Teams include your primary Cardiologist (physician) and Advanced Practice Providers (APPs -  Physician Assistants and Nurse Practitioners) who all work together to provide you with the care you need, when you need it.  Your next appointment:   12 month(s)  The format for your next appointment:   In Person  Provider:    You may see [name], MD or one of the following Advanced Practice Providers on your designated Care Team:    Murray Hodgkins, NP  Christell Faith, PA-C  Marrianne Mood, PA-C   Other Instructions N/A

## 2020-01-20 NOTE — Progress Notes (Signed)
Cardiology Office Note   Date:  01/20/2020   ID:  Matthew Doyle, Matthew Doyle 05/01/1956, MRN [id number]  PCP:  Venia Carbon, MD  Cardiologist:   [name], MD   Chief Complaint  Patient presents with  . OTHER    4 month f/u no complaints today. Meds reviewed verbally with pt.      History of Present Illness: Matthew Doyle is a 64 y.o. male who presents for a follow-up visit regarding coronary artery disease.  He is s/p angioplasty and drug-eluting stent placement to the LAD in 2004 and PCI and drug-eluting stent placement to OM 2 in January 2014 after a small non-ST elevation myocardial infarction. Cardiac catheterization at that time showed patent LAD stent, occluded right PDA distally supplying a small territory and 99% proximal OM 2 stenosis .  Ejection fraction was normal. He presented in July, 2017 with anterolateral ST elevation myocardial infarction complicated by ventricular fibrillation. Cardiac catheterization showed subtotal thrombotic occlusion of proximal LAD at the diagonal bifurcation and also thrombus in the mid left circumflex. He underwent aspiration thrombectomy of the diagonal and left circumflex and drug-eluting stent placement to the proximal LAD. LV gram showed an ejection fraction of 45% but subsequent echo showed normal LV systolic function.  He had cervical myelopathy and underwent cervical spine surgery in November 2017.  He has essential tremors  He has been doing well with no recent chest pain, shortness of breath or palpitations.  He is compliant with all his medication with no reported side effects.     Past Medical History:  Diagnosis Date  . Arthritis    knee  . Atrial fibrillation ([place]) post MI   . CAD (coronary artery disease)    a. 09/2003 Cath/PCI: LAD 80%->3.0x23 Cypher DES;  b. 03/2004 Cath ;  c. NSTEMI 12/2012 cath patent LAD stent, included distal right PDA supplying a small territory, 99% proximal OM 2 status post PCI and DES  placement  d. cath 06/20/2016 anterolateral STEMI stent to LAD, PCTA to diag and LCx  . Colon polyps   . Diverticulosis   . Dysrhythmia   . Gout   . Hemorrhoids   . Hyperlipidemia   . Hypertension   . Internal hemorrhoids without mention of complication   . Myocardial infarction ([place])    LAST 12/16/12  . Sleep apnea   . ST elevation (STEMI) myocardial infarction involving left anterior descending coronary artery ([place]) 06/20/2016    Past Surgical History:  Procedure Laterality Date  . BACK SURGERY  10/20/2016   Cervical fusion--Dr.Chester Johnnette Gourd  . CARDIAC CATHETERIZATION  2014   s/p stent  . CARDIAC CATHETERIZATION N/A 06/20/2016   Procedure: Left Heart Cath and Coronary Angiography;  Surgeon: Sherren Mocha, MD;  Location: [place] [facility];  Service: Cardiovascular;  Laterality: N/A;  . CARDIAC CATHETERIZATION N/A 06/20/2016   Procedure: Coronary Stent Intervention;  Surgeon: Sherren Mocha, MD;  Location: [place] [facility];  Service: Cardiovascular;  Laterality: N/A;  . CHOLECYSTECTOMY N/A 03/13/2016   Procedure: LAPAROSCOPIC CHOLECYSTECTOMY WITH INTRAOPERATIVE CHOLANGIOGRAM;  Surgeon: Christene Lye, MD;  Location: [facility] ORS;  Service: General;  Laterality: N/A;  . CHOLECYSTECTOMY, LAPAROSCOPIC  03/13/16   Infection - lengthy diagnosis d/t lack of gallstones  . COLONOSCOPY  10/2013  . CORONARY ANGIOPLASTY     STENTS  . Cypher Stent  10/04   mid LAD- gupta  . LEFT HEART CATHETERIZATION WITH CORONARY ANGIOGRAM N/A 12/17/2012   Procedure: LEFT HEART CATHETERIZATION WITH  CORONARY ANGIOGRAM;  Surgeon: Peter M Martinique, MD;  Location: [facility] CATH LAB;  Service: Cardiovascular;  Laterality: N/A;  . PERCUTANEOUS CORONARY STENT INTERVENTION (PCI-S) Right 12/17/2012   Procedure: PERCUTANEOUS CORONARY STENT INTERVENTION (PCI-S);  Surgeon: Peter M Martinique, MD;  Location: [facility] CATH LAB;  Service: Cardiovascular;  Laterality: Right;     Current Outpatient Medications  Medication Sig Dispense  Refill  . aspirin 81 MG tablet Take 1 tablet (81 mg total) by mouth daily. 30 tablet   . atorvastatin (LIPITOR) 80 MG tablet TAKE 1 TABLET BY MOUTH ONCE A DAY AT 6 IN THE EVENING 90 tablet 0  . BRILINTA 60 MG TABS tablet TAKE 1 TABLET BY MOUTH TWICE (2) DAILY 180 tablet 0  . losartan (COZAAR) 100 MG tablet TAKE 1 TABLET BY MOUTH ONCE DAILY 90 tablet 0  . nitroGLYCERIN (NITROSTAT) 0.4 MG SL tablet Place 1 tablet (0.4 mg total) under the tongue every 5 (five) minutes as needed for chest pain. 25 tablet 3   No current facility-administered medications for this visit.    Allergies:   Lisinopril    Social History:  The patient  reports that he has never smoked. He has never used smokeless tobacco. He reports that he does not drink alcohol or use drugs.   Family History:  The patient's family history includes Alzheimer's disease in his maternal grandmother; Coronary artery disease in his father and maternal grandfather; Diabetes in his father; Heart attack in his maternal grandfather, maternal uncle, and paternal uncle; Hypertension in his brother and mother; Throat cancer in his maternal grandfather.    ROS:  Please see the history of present illness.   Otherwise, review of systems are positive for none.   All other systems are reviewed and negative.    PHYSICAL EXAM: VS:  BP 128/82 (BP Location: Left Arm, Patient Position: Sitting, Cuff Size: Normal)   Pulse (!) 58   Ht 5\' 11"  (1.803 m)   Wt 247 lb 12 oz (112.4 kg)   SpO2 98%   BMI 34.55 kg/m  , BMI Body mass index is 34.55 kg/m. GEN: Well nourished, well developed, in no acute distress  HEENT: normal  Neck: no JVD, carotid bruits, or masses Cardiac: RRR; no murmurs, rubs, or gallops,no edema  Respiratory:  clear to auscultation bilaterally, normal work of breathing GI: soft, nontender, nondistended, + BS MS: no deformity or atrophy  Skin: warm and dry, no rash Neuro:  Strength and sensation are intact Psych: euthymic mood, full  affect   EKG:  EKG is ordered today. EKG showed sinus bradycardia with no significant ST or T wave changes.   Recent Labs: 08/17/2019: ALT 15; BUN 17; Creatinine, Ser 1.12; Hemoglobin 14.5; Platelets 197.0; Potassium 4.3; Sodium 141    Lipid Panel    Component Value Date/Time   CHOL 95 08/17/2019 0922   CHOL 91 (L) 03/31/2013 0914   TRIG 50.0 08/17/2019 0922   HDL 35.10 (L) 08/17/2019 0922   HDL 36 (L) 03/31/2013 0914   CHOLHDL 3 08/17/2019 0922   VLDL 10.0 08/17/2019 0922   LDLCALC 50 08/17/2019 0922   LDLCALC 47 03/31/2013 0914      Wt Readings from Last 3 Encounters:  01/20/20 247 lb 12 oz (112.4 kg)  08/17/19 227 lb (103 kg)  04/12/19 247 lb (112 kg)       ASSESSMENT AND PLAN:  1.  Coronary artery disease involving native coronary arteries without angina: He is doing well overall with no anginal symptoms. Continue  medical therapy.    Given multiple ischemic cardiac events and high thrombus burden on most recent event, the plan is to keep him on lifelong dual antiplatelet therapy.   2. Essential hypertension: Blood pressure is well controlled on current medications.  3. Hyperlipidemia: Continue high dose atorvastatin .    I reviewed his most recent labs done in September which showed an LDL of 50 and triglyceride of 50.  4.  Sleep apnea: Currently on CPAP.   Disposition: Follow-up in 12 months.  Signed,  [name], MD  01/20/2020 2:57 PM    [facility]

## 2020-05-29 NOTE — Telephone Encounter (Signed)
Patient's wife called stating that she read the mychart message from Dr. Silvio Pate. Patient's wife stated that her husband was real dizzy last night, but today the dizziness comes and goes. Mrs. Arrants stated that her husband told her that he is feels real tired. Appointment scheduled with Dr.Letvak Thursday 05/31/20 at 4:00. Mrs. Sanjuan stated that her husband has not had any chest pain, SOB or difficulty breathing. ER precautions were given to patient's wife and she verbalized understanding. Patient's wife stated that her husband has had several heart attacks and that is why she is so concerned. Mrs. Suen stated that he is going to call his cardiologist and see if he can see him before Thursday. Mrs. Orndoff stated that she will call back and cancel the appointment with Dr. Silvio Pate if she can get him in with his cardiologist sooner. Patient's wife stated that if he develops any more symptoms or if his dizziness gets worse she will take him to the ER.

## 2020-05-31 ENCOUNTER — Ambulatory Visit: Payer: BC Managed Care – PPO | Admitting: Internal Medicine

## 2020-05-31 ENCOUNTER — Other Ambulatory Visit: Payer: Self-pay

## 2020-05-31 ENCOUNTER — Encounter: Payer: Self-pay | Admitting: Internal Medicine

## 2020-05-31 VITALS — BP 138/84 | HR 72 | Temp 97.8°F | Ht 70.5 in | Wt 236.5 lb

## 2020-05-31 DIAGNOSIS — I2510 Atherosclerotic heart disease of native coronary artery without angina pectoris: Secondary | ICD-10-CM

## 2020-05-31 DIAGNOSIS — R42 Dizziness and giddiness: Secondary | ICD-10-CM | POA: Insufficient documentation

## 2020-05-31 DIAGNOSIS — Z125 Encounter for screening for malignant neoplasm of prostate: Secondary | ICD-10-CM | POA: Diagnosis not present

## 2020-05-31 DIAGNOSIS — R5383 Other fatigue: Secondary | ICD-10-CM | POA: Diagnosis not present

## 2020-05-31 NOTE — Progress Notes (Signed)
Subjective:    Patient ID: Matthew Doyle, male    DOB: [birth year], 64 y.o.   MRN: [id number]  HPI Here due to dizziness and fatigue This visit occurred during the SARS-CoV-2 public health emergency.  Safety protocols were in place, including screening questions prior to the visit, additional usage of staff PPE, and extensive cleaning of exam room while observing appropriate contact time as indicated for disinfecting solutions.   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 aspirin 81 MG tablet Take 1 tablet (81 mg total) by mouth daily. 30 tablet   . atorvastatin (LIPITOR) 80 MG tablet TAKE 1 TABLET BY MOUTH ONCE A DAY AT 6 IN THE EVENING 90 tablet 3  . losartan (COZAAR) 100 MG tablet Take 1 tablet (100 mg total) by mouth daily. 90 tablet 3  . nitroGLYCERIN (NITROSTAT) 0.4 MG SL tablet Place 1 tablet (0.4 mg total) under the tongue every 5 (five) minutes as needed for chest pain. 25 tablet 1  . ticagrelor (BRILINTA) 60 MG TABS tablet TAKE 1 TABLET BY MOUTH TWICE (2) DAILY 180 tablet 3   No current facility-administered medications on file prior to visit.    Allergies  Allergen Reactions  . Lisinopril Cough    REACTION: cough with this and/or benazepril    Past Medical History:  Diagnosis Date  . Arthritis    knee  . Atrial fibrillation ([place]) post MI   . CAD (coronary artery disease)    a. 09/2003 Cath/PCI: LAD 80%->3.0x23 Cypher DES;  b. 03/2004 Cath ;  c.  NSTEMI 12/2012 cath patent LAD stent, included distal right PDA supplying a small territory, 99% proximal OM 2 status post PCI and DES placement  d. cath 06/20/2016 anterolateral STEMI stent to LAD, PCTA to diag and LCx  . Colon polyps   . Diverticulosis   . Dysrhythmia   . Gout   . Hemorrhoids   . Hyperlipidemia   . Hypertension   . Internal hemorrhoids without mention of complication   . Myocardial infarction ([place])    LAST 12/16/12  . Sleep apnea   . ST elevation (STEMI) myocardial infarction involving left anterior descending coronary artery ([place]) 06/20/2016    Past Surgical History:  Procedure Laterality Date  . BACK SURGERY  10/20/2016   Cervical fusion--Dr.Chester Johnnette Gourd  . CARDIAC CATHETERIZATION  2014   s/p stent  . CARDIAC CATHETERIZATION N/A 06/20/2016   Procedure: Left Heart Cath and Coronary Angiography;  Surgeon: Sherren Mocha, MD;  Location: [place] [facility];  Service: Cardiovascular;  Laterality: N/A;  . CARDIAC CATHETERIZATION N/A 06/20/2016   Procedure: Coronary Stent Intervention;  Surgeon: Sherren Mocha, MD;  Location: [place] [facility];  Service: Cardiovascular;  Laterality: N/A;  . CHOLECYSTECTOMY N/A 03/13/2016   Procedure: LAPAROSCOPIC CHOLECYSTECTOMY WITH INTRAOPERATIVE CHOLANGIOGRAM;  Surgeon: Christene Lye, MD;  Location: [facility];  Service: General;  Laterality: N/A;  . CHOLECYSTECTOMY, LAPAROSCOPIC  03/13/16   Infection - lengthy diagnosis d/t lack of  gallstones  . COLONOSCOPY  10/2013  . CORONARY ANGIOPLASTY     STENTS  . Cypher Stent  10/04   mid LAD- gupta  . LEFT HEART CATHETERIZATION WITH CORONARY ANGIOGRAM N/A 12/17/2012   Procedure: LEFT HEART CATHETERIZATION WITH CORONARY ANGIOGRAM;  Surgeon: Peter M Martinique, MD;  Location: [facility];  Service: Cardiovascular;  Laterality: N/A;  . PERCUTANEOUS CORONARY STENT INTERVENTION (PCI-S) Right 12/17/2012   Procedure: PERCUTANEOUS CORONARY STENT INTERVENTION (PCI-S);  Surgeon: Peter M Martinique, MD;   Location: [facility] C[facility] LAB;  Service: Cardiovascular;  Laterality: Right;    Family History  Problem Relation Age of Onset  . Diabetes Father   . Coronary artery disease Father        CABG - 5 vessel 15 yr ago  . Heart attack Paternal Uncle        Age 46  . Heart attack Maternal Uncle        Age 43  . Coronary artery disease Maternal Grandfather   . Heart attack Maternal Grandfather        Died in 77s  . Throat cancer Maternal Grandfather   . Hypertension Mother   . Alzheimer's disease Maternal Grandmother   . Hypertension Brother   . Colon cancer Neg Hx   . Esophageal cancer Neg Hx   . Rectal cancer Neg Hx   . Stomach cancer Neg Hx     Social History   Socioeconomic History  . Marital status: Married    Spouse name: Not on file  . Number of children: 2  . Years of education: Not on file  . Highest education level: Not on file  Occupational History  . Occupation: Writer    Comment: part time  . 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 Smoking status: Never Smoker  . Smokeless tobacco: Never Used  Vaping Use  . Vaping Use: Never used  Substance and Sexual Activity  . Alcohol use: No    Alcohol/week: 0.0 standard drinks  . Drug use: No  . Sexual activity: Yes    Partners: Female  Other Topics Concern  . 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 Difficulty of Paying Living Expenses:   Food Insecurity:   . Worried About Charity fundraiser in the Last Year:   . Arboriculturist in the Last Year:   Transportation Needs:   . Film/video editor (Medical):   Marland Kitchen Lack of Transportation (Non-Medical):   Physical Activity:   . Days of Exercise per Week:   . Minutes of Exercise per Session:   Stress:   . Feeling of Stress :   Social Connections:   . Frequency of Communication with  Friends and Family:   . Frequency of Social Gatherings with Friends and Family:   . Attends Religious Services:   . Active Member of Clubs or Organizations:   . Attends Archivist Meetings:   Marland Kitchen Marital Status:   Intimate Partner Violence:   . Fear of Current or Ex-Partner:   . Emotionally Abused:   Marland Kitchen Physically Abused:   . Sexually Abused:    Review of Systems Sleeps okay---nocturia x 1. No daytime somnolence Eating okay--appetite not great Has bitter taste in his mouth for months No fever No cough  or SOB Bruises easily No depression    Objective:   Physical Exam Constitutional:      General: He is not in acute distress.    Appearance: Normal appearance.  Cardiovascular:     Rate and Rhythm: Normal rate and regular rhythm.     Pulses: Normal pulses.     Heart sounds: No murmur heard.  No gallop.   Pulmonary:     Effort: Pulmonary effort is normal.     Breath sounds: Normal breath sounds. No wheezing or rales.  Abdominal:     Palpations: Abdomen is soft.     Tenderness: There is no abdominal tenderness.  Musculoskeletal:     Cervical back: Neck supple.     Right lower leg: No edema.     Left lower leg: No edema.  Lymphadenopathy:     Cervical: No cervical adenopathy.  Neurological:     General: No focal deficit present.     Mental Status: He is alert.  Psychiatric:        Mood and Affect: Mood normal.        Behavior: Behavior normal.            Assessment & Plan:

## 2020-05-31 NOTE — Assessment & Plan Note (Signed)
Goes back for months Did lose some weight--back to previous baseline though Not depressed Doesn't seem to be having heart problems or medication reaction Will check labs

## 2020-05-31 NOTE — Assessment & Plan Note (Signed)
Single spell after work No chest pain or SOB Didn't seem to be dehydrated Not sure BP was up ---their cuff is measuring 14mmHg higher than ours No recurrence May need to go back to cardiologist if recurs

## 2020-05-31 NOTE — Assessment & Plan Note (Signed)
Doesn't seem to have cardiac symptoms May need to do work up if nothing else shows up with testing On ARB, statin, brilinta

## 2020-06-01 LAB — CBC
HCT: 45.5 % (ref 39.0–52.0)
Hemoglobin: 15.6 g/dL (ref 13.0–17.0)
MCHC: 34.1 g/dL (ref 30.0–36.0)
MCV: 85.2 fl (ref 78.0–100.0)
Platelets: 220 10*3/uL (ref 150.0–400.0)
RBC: 5.35 Mil/uL (ref 4.22–5.81)
RDW: 13.9 % (ref 11.5–15.5)
WBC: 10.2 10*3/uL (ref 4.0–10.5)

## 2020-06-01 LAB — T4, FREE: Free T4: 1.07 ng/dL (ref 0.60–1.60)

## 2020-06-01 LAB — COMPREHENSIVE METABOLIC PANEL
ALT: 22 U/L (ref 0–53)
AST: 19 U/L (ref 0–37)
Albumin: 4.6 g/dL (ref 3.5–5.2)
Alkaline Phosphatase: 68 U/L (ref 39–117)
BUN: 18 mg/dL (ref 6–23)
CO2: 28 mEq/L (ref 19–32)
Calcium: 10 mg/dL (ref 8.4–10.5)
Chloride: 105 mEq/L (ref 96–112)
Creatinine, Ser: 1.13 mg/dL (ref 0.40–1.50)
GFR: 65.19 mL/min (ref >60.00)
Glucose, Bld: 103 mg/dL — ABNORMAL HIGH (ref 70–99)
Potassium: 4.2 mEq/L (ref 3.5–5.1)
Sodium: 142 mEq/L (ref 135–145)
Total Bilirubin: 0.9 mg/dL (ref 0.2–1.2)
Total Protein: 7.5 g/dL (ref 6.0–8.3)

## 2020-06-01 LAB — LIPID PANEL
Cholesterol: 106 mg/dL (ref 0–200)
HDL: 34.1 mg/dL — ABNORMAL LOW (ref >39.00)
LDL Cholesterol: 55 mg/dL (ref 0–99)
NonHDL: 71.81
Total CHOL/HDL Ratio: 3
Triglycerides: 82 mg/dL (ref 0.0–149.0)
VLDL: 16.4 mg/dL (ref 0.0–40.0)

## 2020-06-01 LAB — VITAMIN B12: Vitamin B-12: 372 pg/mL (ref 211–911)

## 2020-06-01 LAB — SEDIMENTATION RATE: Sed Rate: 17 mm/hr (ref 0–20)

## 2020-06-01 LAB — PSA: PSA: 1.05 ng/mL (ref 0.10–4.00)

## 2020-08-21 ENCOUNTER — Other Ambulatory Visit: Payer: Self-pay

## 2020-08-21 ENCOUNTER — Encounter: Payer: Self-pay | Admitting: Internal Medicine

## 2020-08-21 ENCOUNTER — Ambulatory Visit (INDEPENDENT_AMBULATORY_CARE_PROVIDER_SITE_OTHER): Payer: BC Managed Care – PPO | Admitting: Internal Medicine

## 2020-08-21 VITALS — BP 118/84 | HR 51 | Temp 97.4°F | Ht 70.5 in | Wt 236.0 lb

## 2020-08-21 DIAGNOSIS — I2510 Atherosclerotic heart disease of native coronary artery without angina pectoris: Secondary | ICD-10-CM

## 2020-08-21 DIAGNOSIS — G20 Parkinson's disease: Secondary | ICD-10-CM

## 2020-08-21 DIAGNOSIS — I10 Essential (primary) hypertension: Secondary | ICD-10-CM | POA: Diagnosis not present

## 2020-08-21 DIAGNOSIS — Z23 Encounter for immunization: Secondary | ICD-10-CM | POA: Diagnosis not present

## 2020-08-21 DIAGNOSIS — Z0000 Encounter for general adult medical examination without abnormal findings: Secondary | ICD-10-CM

## 2020-08-21 NOTE — Addendum Note (Signed)
Addended by: Pilar Grammes on: 08/21/2020 11:32 AM   Modules accepted: Orders

## 2020-08-21 NOTE — Assessment & Plan Note (Signed)
Quiet on losartan, atorvastatin, brilinta, ASA

## 2020-08-21 NOTE — Assessment & Plan Note (Signed)
BP Readings from Last 3 Encounters:  08/21/20 118/84  05/31/20 138/84  01/20/20 128/82   Good control on the losartan 100

## 2020-08-21 NOTE — Assessment & Plan Note (Signed)
Healthy Discussed fitness Refuses to consider COVID vaccine Consider PSA again in 2 years Colon due 2027

## 2020-08-21 NOTE — Progress Notes (Signed)
Subjective:    Patient ID: Matthew Doyle, male    DOB: [birth year], 64 y.o.   MRN: [id number]  HPI Here for physical This visit occurred during the SARS-CoV-2 public health emergency.  Safety protocols were in place, including screening questions prior to the visit, additional usage of staff PPE, and extensive cleaning of exam room while observing appropriate contact time as indicated for disinfecting solutions.   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 aspirin 81 MG tablet Take 1 tablet (81 mg total) by mouth daily. 30 tablet   . atorvastatin (LIPITOR) 80 MG tablet TAKE 1 TABLET BY MOUTH ONCE A DAY AT 6 IN THE EVENING 90 tablet 3  . losartan (COZAAR) 100 MG tablet Take 1 tablet (100 mg total) by mouth daily. 90 tablet 3  . nitroGLYCERIN (NITROSTAT) 0.4 MG SL tablet Place 1 tablet (0.4 mg total) under the tongue every 5 (five) minutes as needed for chest pain. 25 tablet 1  . ticagrelor (BRILINTA) 60 MG TABS tablet TAKE 1 TABLET BY MOUTH TWICE (2) DAILY 180 tablet 3   No current facility-administered medications on file prior to visit.    Allergies  Allergen Reactions  . Lisinopril Cough    REACTION: cough with this and/or benazepril    Past Medical History:  Diagnosis Date  . Arthritis    knee  . Atrial fibrillation ([place]) post MI   . CAD (coronary artery disease)    a. 09/2003 Cath/PCI: LAD 80%->3.0x23 Cypher DES;  b. 03/2004 Cath ;  c. NSTEMI 12/2012 cath patent LAD stent, included distal right PDA supplying a small territory, 99% proximal OM 2 status post PCI and DES placement  d. cath 06/20/2016 anterolateral STEMI stent to LAD, PCTA to diag and LCx  . Colon polyps   . Diverticulosis   . Dysrhythmia   . Gout   . Hemorrhoids   . Hyperlipidemia   . Hypertension   . Internal  hemorrhoids without mention of complication   . Myocardial infarction ([place])    LAST 12/16/12  . Sleep apnea   . ST elevation (STEMI) myocardial infarction involving left anterior descending coronary artery ([place]) 06/20/2016    Past Surgical History:  Procedure Laterality Date  . BACK SURGERY  10/20/2016   Cervical fusion--Dr.Chester Johnnette Gourd  . CARDIAC CATHETERIZATION  2014   s/p stent  . CARDIAC CATHETERIZATION N/A 06/20/2016   Procedure: Left Heart Cath and Coronary Angiography;  Surgeon: Sherren Mocha, MD;  Location: [place] [facility];  Service: Cardiovascular;  Laterality: N/A;  . CARDIAC CATHETERIZATION N/A 06/20/2016   Procedure: Coronary Stent Intervention;  Surgeon: Sherren Mocha, MD;  Location: [facility] [facility];  Service: Cardiovascular;  Laterality: N/A;  . CHOLECYSTECTOMY N/A 03/13/2016   Procedure: LAPAROSCOPIC CHOLECYSTECTOMY WITH INTRAOPERATIVE CHOLANGIOGRAM;  Surgeon: Christene Lye, MD;  Location: [facility] ORS;  Service: General;  Laterality: N/A;  . CHOLECYSTECTOMY, LAPAROSCOPIC  03/13/16   Infection - lengthy diagnosis d/t lack of gallstones  . COLONOSCOPY  10/2013  . CORONARY ANGIOPLASTY     STENTS  . Cypher Stent  10/04   mid LAD- gupta  . LEFT HEART CATHETERIZATION WITH CORONARY ANGIOGRAM N/A 12/17/2012   Procedure: LEFT HEART CATHETERIZATION WITH CORONARY ANGIOGRAM;  Surgeon: Peter M Martinique, MD;  Location: [facility] CATH LAB;  Service: Cardiovascular;  Laterality: N/A;  .  PERCUTANEOUS CORONARY STENT INTERVENTION (PCI-S) Right 12/17/2012   Procedure: PERCUTANEOUS CORONARY STENT INTERVENTION (PCI-S);  Surgeon: Peter M Martinique, MD;  Location: [facility] CATH LAB;  Service: Cardiovascular;  Laterality: Right;    Family History  Problem Relation Age of Onset  . Diabetes Father   . Coronary artery disease Father        CABG - 5 vessel 15 yr ago  . Heart attack Paternal Uncle        Age 65  . Heart attack Maternal Uncle        Age 81  . Coronary artery disease Maternal  Grandfather   . Heart attack Maternal Grandfather        Died in 35s  . Throat cancer Maternal Grandfather   . Hypertension Mother   . Alzheimer's disease Maternal Grandmother   . Hypertension Brother   . Colon cancer Neg Hx   . Esophageal cancer Neg Hx   . Rectal cancer Neg Hx   . Stomach cancer Neg Hx     Social History   Socioeconomic History  . Marital status: Married    Spouse name: Not on file  . Number of children: 2  . Years of education: Not on file  . Highest education level: Not on file  Occupational History  . Occupation: Writer    Comment: part time  . 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 Smoking status: Never Smoker  . Smokeless tobacco: Never Used  Vaping Use  . Vaping Use: Never used  Substance and Sexual Activity  . Alcohol use: No    Alcohol/week: 0.0 standard drinks  . Drug use: No  . Sexual activity: Yes    Partners: Female  Other Topics Concern  . 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 Difficulty of Paying Living Expenses: Not on file  Food Insecurity:   . Worried About Charity fundraiser in the Last Year: Not on file  . Ran Out of Food in the Last Year: Not on file  Transportation Needs:   . Lack of Transportation (Medical): Not on file  . Lack of Transportation (Non-Medical): Not on file  Physical Activity:   . Days of Exercise per Week: Not on file  . Minutes of Exercise per Session: Not on file  Stress:   . Feeling of Stress : Not on file  Social Connections:   . Frequency of Communication with Friends and Family: Not on file  . Frequency of Social Gatherings with Friends and Family: Not on file  . Attends Religious Services: Not on file  . Active Member of Clubs or Organizations: Not on file  . Attends Archivist Meetings: Not on file   . Marital Status: Not on file  Intimate Partner Violence:   . Fear of Current or Ex-Partner: Not on file  . Emotionally Abused: Not on file  . Physically Abused: Not on file  . Sexually Abused: Not on file   Review of Systems  Constitutional: Negative for fatigue.       Weight up and down some Wears seat belt  HENT: Positive for tinnitus.        Bad hearing in right ear Bad teeth---thinks he just needs extractions and plate for top  Eyes: Negative for visual  disturbance.       No diplopia or unilateral vision loss  Respiratory: Negative for cough, chest tightness and shortness of breath.   Cardiovascular: Negative for chest pain, palpitations and leg swelling.  Gastrointestinal: Negative for blood in stool.       Uses OTC meds prn for constipation Slight blood on toilet paper if strains  Endocrine: Negative for polydipsia and polyuria.  Genitourinary: Negative for difficulty urinating and urgency.       No sexual problems  Musculoskeletal: Negative for arthralgias, back pain and joint swelling.  Skin: Negative for rash.       No suspicious lesions  Allergic/Immunologic: Positive for environmental allergies. Negative for immunocompromised state.       Rare cetirizine  Neurological: Negative for dizziness, syncope, light-headedness and headaches.  Hematological: Negative for adenopathy. Bruises/bleeds easily.  Psychiatric/Behavioral: Negative for dysphoric mood and sleep disturbance. The patient is not nervous/anxious.        Nocturia x 1---sleeps okay       Objective:   Physical Exam Constitutional:      Appearance: Normal appearance.  HENT:     Right Ear: Tympanic membrane, ear canal and external ear normal.     Left Ear: Tympanic membrane, ear canal and external ear normal.     Mouth/Throat:     Pharynx: No oropharyngeal exudate or posterior oropharyngeal erythema.  Eyes:     Conjunctiva/sclera: Conjunctivae normal.     Pupils: Pupils are equal, round, and reactive  to light.  Cardiovascular:     Rate and Rhythm: Normal rate and regular rhythm.     Pulses: Normal pulses.     Heart sounds: Normal heart sounds. No murmur heard.  No gallop.   Pulmonary:     Effort: Pulmonary effort is normal.     Breath sounds: Normal breath sounds. No wheezing or rales.  Abdominal:     Palpations: Abdomen is soft.     Tenderness: There is no abdominal tenderness.  Musculoskeletal:     Cervical back: Neck supple.     Right lower leg: No edema.     Left lower leg: No edema.  Lymphadenopathy:     Cervical: No cervical adenopathy.  Skin:    General: Skin is warm.     Findings: No rash.  Neurological:     Mental Status: He is alert and oriented to person, place, and time.     Comments: Mild tremor --only in left hand now (but he has it other places as well)  Psychiatric:        Mood and Affect: Mood normal.        Behavior: Behavior normal.            Assessment & Plan:

## 2020-08-21 NOTE — Assessment & Plan Note (Signed)
Mild tremor Will follow up with Dr Tat if this progresses

## 2020-08-24 ENCOUNTER — Telehealth: Payer: Self-pay | Admitting: Internal Medicine

## 2020-08-24 ENCOUNTER — Ambulatory Visit
Admission: EM | Admit: 2020-08-24 | Discharge: 2020-08-24 | Disposition: A | Discharge status: Home / Self Care | Payer: BC Managed Care – PPO | Attending: Emergency Medicine | Admitting: Emergency Medicine

## 2020-08-24 ENCOUNTER — Telehealth: Payer: Self-pay

## 2020-08-24 DIAGNOSIS — I10 Essential (primary) hypertension: Secondary | ICD-10-CM

## 2020-08-24 DIAGNOSIS — R42 Dizziness and giddiness: Secondary | ICD-10-CM | POA: Diagnosis not present

## 2020-08-24 MED ORDER — MECLIZINE HCL 12.5 MG PO TABS: 12.5000 mg | ORAL_TABLET | Freq: Three times a day (TID) | ORAL | 0 refills | Status: AC | PRN

## 2020-08-24 NOTE — Telephone Encounter (Signed)
I spoke with pt's wife; pt is very dizzy,room is spinning; vomits when moves around, BP is elevated 182/104. No CP or H/A. Pt will go to [facility] in [place]. FYI to Dr Silvio Pate.

## 2020-08-24 NOTE — Discharge Instructions (Addendum)
Go to the emergency department if you have acute worsening symptoms or develop new symptoms such as chest pain, shortness of breath, or other concerns.  Your blood pressure is elevated today at 179/94 and 144/90.  Please have this rechecked by your primary care provider next week.      Take the meclizine as needed for dizziness.  Do not drive, operate machinery, or drink alcohol with this medication as it may cause drowsiness.

## 2020-08-24 NOTE — Telephone Encounter (Signed)
Please check on him on Monday 

## 2020-08-24 NOTE — Telephone Encounter (Signed)
[facility] - Client TELEPHONE ADVICE RECORD [facility] Patient Name: Matthew Doyle Gender: Male DOB: 06-14-1956 Age: 64 Y 11 M 36 D Return Phone Number: [phone] (Primary), [phone] (Secondary) [place] [address] Client [facility] - Client Client Site [facility] - Day Physician Viviana Simpler- MD Contact Type Call Who Is Calling Patient / Member / Family / Caregiver Call Type Triage / Clinical Relationship To Patient Self Return Phone Number [phone] (Primary) Chief Complaint Dizziness Reason for Call Symptomatic / Request for [place] states her husband is very dizzy. Pt is nauseated. Pt had elevated BP 182 / 104. When pt is up and moving, he vomits. Translation No Nurse Assessment Nurse: Claiborne Billings, RN, Kim Date/Time (Eastern Time): 08/24/2020 9:18:34 AM Confirm and document reason for call. If symptomatic, describe symptoms. ---Caller states her husband's BP is currently 182/104. States husband is dizzy and nauseous; vomits if he gets up and moves around. Does the patient have any new or worsening symptoms? ---Yes Will a triage be completed? ---Yes Related visit to physician within the last 2 weeks? ---Yes Does the PT have any chronic conditions? (i.e. diabetes, asthma, this includes High risk factors for pregnancy, etc.) ---Yes List chronic conditions. ---HTN, HLP, CAD, Hx several heart attacks Is this a behavioral health or substance abuse call? ---No Guidelines Guideline Title Affirmed Question Affirmed Notes Nurse Date/Time (Eastern Time) Dizziness - Lightheadedness SEVERE dizziness (e.g., unable to stand, requires support to walk, feels like passing out now) Claiborne Billings, Therapist, sports, Maudie Mercury 08/24/2020 9:27:18 AM Disp. Time Eilene Ghazi Time) Disposition Final User 08/24/2020 9:27:45 AM Go to ED Now (or PCP triage) Yes Claiborne Billings, RN,  Max Sane Disagree/Comply Disagree PLEASE NOTE: All timestamps contained within this report are represented as [place] Standard Time. CONFIDENTIALTY NOTICE: This fax transmission is intended only for the addressee. It contains information that is legally privileged, confidential or otherwise protected from use or disclosure. If you are not the intended recipient, you are strictly prohibited from reviewing, disclosing, copying using or disseminating any of this information or taking any action in reliance on or regarding this information. If you have received this fax in error, please notify us immediately by telephone so that we can arrange for its return to [place]. Phone: [phone], Toll-Free: [phone], Fax: [phone] Page: 2 of 2 Call Id: [id number] [place] Understands Yes PreDisposition Did not know what to do Care Advice Given Per Guideline GO TO ED NOW (OR PCP TRIAGE): ANOTHER ADULT SHOULD DRIVE: BRING MEDICINES: * Bring a list of your current medicines when you go to the Emergency Department (ER). CARE ADVICE given per Dizziness (Adult) guideline. Comments User: Suezanne Jacquet, RN Date/Time Eilene Ghazi Time): 08/24/2020 9:29:06 AM Caller states her husband refuses to go to ER or UC because he doesn't want to be exposed to [place]. User: Suezanne Jacquet, RN Date/Time Eilene Ghazi Time): 08/24/2020 9:35:56 AM Nurse called backline and office numbers to speak with someone regarding pt's refusal to go to ER or UC; no answer at either number. Nurse called back and spoke with pt's wife, informed her I wasn't able to reach anyone in office and recommendation is still that pt go to ER or UC due to current sxs of dizziness, vomiting and elevated BP. Caller states she will continue to encourage him to go in for eval or will continue to try office to see if she can reach someone. Referrals GO TO FACILITY REFUSED

## 2020-08-24 NOTE — ED Triage Notes (Addendum)
Patient reports he experienced dizziness last night to the point where it made him vomit. Reports his wife took his BP and it was 180s/100s. Called his PCP, who instructed him to come to urgent care. Also reports ringing in the bilateral ears and diminished sound on the right side.   Denies chest pain, shortness of breath.

## 2020-08-24 NOTE — ED Provider Notes (Signed)
Matthew Doyle    CSN: [id number] Arrival date & time: 08/24/20  1159      History   Chief Complaint Chief Complaint  Patient presents with  . Dizziness  . Hypertension  . Sent by PCP    HPI Matthew Doyle is a 64 y.o. male.   Patient presents with episode of dizziness last night.  He is describes this as the room spinning and had associated nausea with vomiting.  His wife took his blood pressure at the time and it was 180/100.  He denies current dizziness.  He denies chest pain, shortness of breath, weakness, numbness, facial asymmetry, slurred speech, confusion, headache, or other symptoms.  Patient's history includes STEMI, CAD, HTN, Atrial fibrillation.   The history is provided by the patient.    Past Medical History:  Diagnosis Date  . Arthritis    knee  . Atrial fibrillation ([place]) post MI   . CAD (coronary artery disease)    a. 09/2003 Cath/PCI: LAD 80%->3.0x23 Cypher DES;  b. 03/2004 Cath ;  c. NSTEMI 12/2012 cath patent LAD stent, included distal right PDA supplying a small territory, 99% proximal OM 2 status post PCI and DES placement  d. cath 06/20/2016 anterolateral STEMI stent to LAD, PCTA to diag and LCx  . Colon polyps   . Diverticulosis   . Dysrhythmia   . Gout   . Hemorrhoids   . Hyperlipidemia   . Hypertension   . Internal hemorrhoids without mention of complication   . Myocardial infarction ([place])    LAST 12/16/12  . Sleep apnea   . ST elevation (STEMI) myocardial infarction involving left anterior descending coronary artery ([place]) 06/20/2016    Patient Active Problem List   Diagnosis Date Noted  . Parkinsonism ([place]) 08/07/2017  . 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/28/2015  . Personal history of colonic polyps 08/30/2013  . CAD (coronary artery disease)   . Routine general medical examination at a health care facility 05/13/2012  . GOUT 12/25/2010  . POLYP, COLON 11/01/2007  . DIVERTICULOSIS, COLON 11/01/2007  . Hyperlipidemia 09/17/2007  .  Essential hypertension 09/17/2007    Past Surgical History:  Procedure Laterality Date  . BACK SURGERY  10/20/2016   Cervical fusion--Dr.Chester Johnnette Gourd  . CARDIAC CATHETERIZATION  2014   s/p stent  . CARDIAC CATHETERIZATION N/A 06/20/2016   Procedure: Left Heart Cath and Coronary Angiography;  Surgeon: Sherren Mocha, MD;  Location: [place] [facility];  Service: Cardiovascular;  Laterality: N/A;  . CARDIAC CATHETERIZATION N/A 06/20/2016   Procedure: Coronary Stent Intervention;  Surgeon: Sherren Mocha, MD;  Location: [place] [facility];  Service: Cardiovascular;  Laterality: N/A;  . CHOLECYSTECTOMY N/A 03/13/2016   Procedure: LAPAROSCOPIC CHOLECYSTECTOMY WITH INTRAOPERATIVE CHOLANGIOGRAM;  Surgeon: Christene Lye, MD;  Location: [facility];  Service: General;  Laterality: N/A;  . CHOLECYSTECTOMY, LAPAROSCOPIC  03/13/16   Infection - lengthy diagnosis d/t lack of gallstones  . COLONOSCOPY  10/2013  . CORONARY ANGIOPLASTY     STENTS  . Cypher Stent  10/04   mid LAD- gupta  . LEFT HEART CATHETERIZATION WITH CORONARY ANGIOGRAM N/A 12/17/2012   Procedure: LEFT HEART CATHETERIZATION WITH CORONARY ANGIOGRAM;  Surgeon: Peter M Martinique, MD;  [facility];  Service: Cardiovascular;  Laterality: N/A;  . PERCUTANEOUS CORONARY STENT INTERVENTION (PCI-S) Right 12/17/2012   Procedure: PERCUTANEOUS CORONARY STENT INTERVENTION (PCI-S);  Surgeon: Peter M Martinique, MD;  Location: [facility] CATH LAB;  Service: Cardiovascular;  Laterality: Right;  Home Medications    Prior to Admission medications   Medication Sig Start Date End Date Taking? Authorizing Provider  aspirin 81 MG tablet Take 1 tablet (81 mg total) by mouth daily. 12/18/12  Yes Barrett, Evelene Croon, PA-C  losartan (COZAAR) 100 MG tablet Take 1 tablet (100 mg total) by mouth daily. 01/20/20  Yes Wellington Hampshire, MD  ticagrelor (BRILINTA) 60 MG TABS tablet TAKE 1 TABLET BY MOUTH TWICE (2) DAILY 01/20/20  Yes Wellington Hampshire, MD    atorvastatin (LIPITOR) 80 MG tablet TAKE 1 TABLET BY MOUTH ONCE A DAY AT 6 IN THE EVENING 01/20/20   Wellington Hampshire, MD  meclizine (ANTIVERT) 12.5 MG tablet Take 1 tablet (12.5 mg total) by mouth 3 (three) times daily as needed for dizziness. 08/24/20   Sharion Balloon, NP  nitroGLYCERIN (NITROSTAT) 0.4 MG SL tablet Place 1 tablet (0.4 mg total) under the tongue every 5 (five) minutes as needed for chest pain. 01/20/20   Wellington Hampshire, MD    Family History Family History  Problem Relation Age of Onset  . Diabetes Father   . Coronary artery disease Father        CABG - 5 vessel 15 yr ago  . Heart attack Paternal Uncle        Age 51  . Heart attack Maternal Uncle        Age 64  . Coronary artery disease Maternal Grandfather   . Heart attack Maternal Grandfather        Died in 71s  . Throat cancer Maternal Grandfather   . Hypertension Mother   . Alzheimer's disease Maternal Grandmother   . Hypertension Brother   . Colon cancer Neg Hx   . Esophageal cancer Neg Hx   . Rectal cancer Neg Hx   . Stomach cancer Neg Hx     Social History Social History   Tobacco Use  . Smoking status: Never Smoker  . Smokeless tobacco: Never Used  Vaping Use  . Vaping Use: Never used  Substance Use Topics  . Alcohol use: No    Alcohol/week: 0.0 standard drinks  . Drug use: No     Allergies   Lisinopril   Review of Systems Review of Systems  Constitutional: Negative for chills and fever.  HENT: Negative for ear pain and sore throat.   Eyes: Negative for pain and visual disturbance.  Respiratory: Negative for cough and shortness of breath.   Cardiovascular: Negative for chest pain and palpitations.  Gastrointestinal: Positive for nausea and vomiting. Negative for abdominal pain and diarrhea.  Genitourinary: Negative for dysuria and hematuria.  Musculoskeletal: Negative for arthralgias and back pain.  Skin: Negative for color change and rash.  Neurological: Positive for dizziness.  Negative for tremors, seizures, syncope, facial asymmetry, speech difficulty, weakness, light-headedness, numbness and headaches.  All other systems reviewed and are negative.    Physical Exam Triage Vital Signs ED Triage Vitals  Enc Vitals Group     BP 08/24/20 1245 (!) 179/94     Pulse Rate 08/24/20 1245 61     Resp 08/24/20 1245 14     Temp 08/24/20 1245 99 F (37.2 C)     Temp src --      SpO2 08/24/20 1245 98 %     Weight --      Height --      Head Circumference --      Peak Flow --      Pain Score 08/24/20  1242 0     Pain Loc --      Pain Edu? --      Excl. in [place]? --    Orthostatic VS for the past 24 hrs:  BP- Lying Pulse- Lying BP- Standing at 0 minutes Pulse- Standing at 0 minutes  08/24/20 1256 (!) 167/99 62 (!) 176/101 74    Updated Vital Signs BP (!) 144/90   Pulse 67   Temp 99 F (37.2 C)   Resp 14   SpO2 98%   Visual Acuity Right Eye Distance:   Left Eye Distance:   Bilateral Distance:    Right Eye Near:   Left Eye Near:    Bilateral Near:     Physical Exam Vitals and nursing note reviewed.  Constitutional:      General: He is not in acute distress.    Appearance: He is well-developed. He is not ill-appearing.  HENT:     Head: Normocephalic and atraumatic.     Mouth/Throat:     Mouth: Mucous membranes are moist.  Eyes:     Extraocular Movements: Extraocular movements intact.     Conjunctiva/sclera: Conjunctivae normal.     Pupils: Pupils are equal, round, and reactive to light.  Cardiovascular:     Rate and Rhythm: Normal rate and regular rhythm.     Heart sounds: No murmur heard.   Pulmonary:     Effort: Pulmonary effort is normal. No respiratory distress.     Breath sounds: Normal breath sounds.  Abdominal:     Palpations: Abdomen is soft.     Tenderness: There is no abdominal tenderness. There is no guarding or rebound.  Musculoskeletal:     Cervical back: Neck supple.     Right lower leg: No edema.     Left lower leg: No  edema.  Skin:    General: Skin is warm and dry.     Findings: No rash.  Neurological:     General: No focal deficit present.     Mental Status: He is alert and oriented to person, place, and time.     Cranial Nerves: No cranial nerve deficit.     Sensory: No sensory deficit.     Motor: No weakness.     Coordination: Coordination normal.     Gait: Gait normal.  Psychiatric:        Mood and Affect: Mood normal.        Behavior: Behavior normal.      UC Treatments / Results  Labs (all labs ordered are listed, but only abnormal results are displayed) Labs Reviewed - No data to display  EKG   Radiology No results found.  Procedures Procedures (including critical care time)  Medications Ordered in UC Medications - No data to display  Initial Impression / Assessment and Plan / UC Course  I have reviewed the triage vital signs and the nursing notes.  Pertinent labs & imaging results that were available during my care of the patient were reviewed by me and considered in my medical decision making (see chart for details).   Dizziness, elevated blood pressure reading with known hypertension.  Patient declines transfer to the ED.  EKG shows sinus bradycardia, rate 57, no ST elevation, compared to previous from February 2021.  Instructed patient to go to the ED if he has acute worsening symptoms or develops new symptoms such as chest pain or shortness of breath.  Instructed him to follow-up with his PCP next week to have his  blood pressure rechecked.  Patient agrees to plan of care.   Final Clinical Impressions(s) / UC Diagnoses   Final diagnoses:  Dizziness  Elevated blood pressure reading in office with diagnosis of hypertension     Discharge Instructions     Go to the emergency department if you have acute worsening symptoms or develop new symptoms such as chest pain, shortness of breath, or other concerns.  Your blood pressure is elevated today at 179/94 and 144/90.   Please have this rechecked by your primary care provider next week.      Take the meclizine as needed for dizziness.  Do not drive, operate machinery, or drink alcohol with this medication as it may cause drowsiness.           ED Prescriptions    Medication Sig Dispense Auth. Provider   meclizine (ANTIVERT) 12.5 MG tablet Take 1 tablet (12.5 mg total) by mouth 3 (three) times daily as needed for dizziness. 30 tablet Sharion Balloon, NP     PDMP not reviewed this encounter.   Sharion Balloon, NP 08/24/20 1415

## 2020-08-24 NOTE — Telephone Encounter (Signed)
Pt's wife called and said pt was treated at [facility] on [address] in [place] today for dizziness/nausea and b/p.  They told him to follow up w/you on Monday or Tuesday, however, you don't have anything until Wednesday.  I have scheduled an appt for Wednesday, but could you pls review his notes from UC and see if he needs to be seen prior to that or if you want to open up something for him?  Please advise.  Thank you!

## 2020-08-26 NOTE — Telephone Encounter (Signed)
You can add him on at 4:15 on Monday

## 2020-08-27 ENCOUNTER — Other Ambulatory Visit: Payer: Self-pay

## 2020-08-27 ENCOUNTER — Ambulatory Visit: Payer: BC Managed Care – PPO | Admitting: Internal Medicine

## 2020-08-27 ENCOUNTER — Encounter: Payer: Self-pay | Admitting: Internal Medicine

## 2020-08-27 DIAGNOSIS — I10 Essential (primary) hypertension: Secondary | ICD-10-CM

## 2020-08-27 DIAGNOSIS — R42 Dizziness and giddiness: Secondary | ICD-10-CM | POA: Diagnosis not present

## 2020-08-27 MED ORDER — METOPROLOL SUCCINATE ER 25 MG PO TB24
25.0000 mg | ORAL_TABLET | Freq: Every day | ORAL | 3 refills | Status: DC
Start: 2020-08-27 — End: 2021-01-29

## 2020-08-27 NOTE — Telephone Encounter (Signed)
Left a message for pt to call office then noticed he has an OV here today.

## 2020-08-27 NOTE — Assessment & Plan Note (Signed)
Classic symptoms No nystagmus No focal neuro findings to suggest CVA or tumor---will hold off on MRI for now Discussed using the meclizine if his symptoms recur

## 2020-08-27 NOTE — Patient Instructions (Signed)
Start the new medication (metoprolol) with 1/2 tab daily. If you have no problems with this, and your blood pressure stays up after 1 week, go up to 1 full tab daily.

## 2020-08-27 NOTE — Progress Notes (Signed)
Subjective:    Patient ID: Matthew Doyle, male    DOB: [birth year], 64 y.o.   MRN: [id number]  HPI Here due to dizziness This visit occurred during the SARS-CoV-2 public health emergency.  Safety protocols were in place, including screening questions prior to the visit, additional usage of staff PPE, and extensive cleaning of exam room while observing appropriate contact time as indicated for disinfecting solutions.   4 nights ago--got bad dizziness.  Couldn't even stand up or walk BP was high Went to urgent care---(records reviewed) Non focal exam and BP did improve while there BP better at home since then--mild headache (some frontal pressure that isn't bad)  Has true rotatory vertigo Had woken in bed to this at first Got nausea and vomiting when tried to get up---but okay if stayed still Did improve as the day went on---and much better by done with the urgent care Better over the weekend Slight "swimmy headedness" today  Given meclizine at urgent care---put him to sleep  Current Outpatient Medications on File Prior to Visit  Medication Sig Dispense Refill  . aspirin 81 MG tablet Take 1 tablet (81 mg total) by mouth daily. 30 tablet   . atorvastatin (LIPITOR) 80 MG tablet TAKE 1 TABLET BY MOUTH ONCE A DAY AT 6 IN THE EVENING 90 tablet 3  . losartan (COZAAR) 100 MG tablet Take 1 tablet (100 mg total) by mouth daily. 90 tablet 3  . nitroGLYCERIN (NITROSTAT) 0.4 MG SL tablet Place 1 tablet (0.4 mg total) under the tongue every 5 (five) minutes as needed for chest pain. 25 tablet 1  . ticagrelor (BRILINTA) 60 MG TABS tablet TAKE 1 TABLET BY MOUTH TWICE (2) DAILY 180 tablet 3  . meclizine (ANTIVERT) 12.5 MG tablet Take 1 tablet (12.5 mg total) by mouth 3 (three) times daily as needed for dizziness. (Patient not taking: Reported on 08/27/2020) 30 tablet 0   No current facility-administered medications on file prior to visit.    Allergies  Allergen Reactions  . Lisinopril Cough     REACTION: cough with this and/or benazepril    Past Medical History:  Diagnosis Date  . Arthritis    knee  . Atrial fibrillation ([place]) post MI   . CAD (coronary artery disease)    a. 09/2003 Cath/PCI: LAD 80%->3.0x23 Cypher DES;  b. 03/2004 Cath ;  c. NSTEMI 12/2012 cath patent LAD stent, included distal right PDA supplying a small territory, 99% proximal OM 2 status post PCI and DES placement  d. cath 06/20/2016 anterolateral STEMI stent to LAD, PCTA to diag and LCx  . Colon polyps   . Diverticulosis   . Dysrhythmia   . Gout   . Hemorrhoids   . Hyperlipidemia   . Hypertension   . Internal hemorrhoids without mention of complication   . Myocardial infarction ([place])    LAST 12/16/12  . Sleep apnea   . ST elevation (STEMI) myocardial infarction involving left anterior descending coronary artery ([place]) 06/20/2016    Past Surgical History:  Procedure Laterality Date  . BACK SURGERY  10/20/2016   Cervical fusion--Dr.Chester Johnnette Gourd  . CARDIAC CATHETERIZATION  2014   s/p stent  . CARDIAC CATHETERIZATION N/A 06/20/2016   Procedure: Left Heart Cath and Coronary Angiography;  Surgeon: Sherren Mocha, MD;  Location: [place] [facility];  Service: Cardiovascular;  Laterality: N/A;  . CARDIAC CATHETERIZATION N/A 06/20/2016   Procedure: Coronary Stent Intervention;  Surgeon: Sherren Mocha, MD;  Location: [place] [facility];  Service:  Cardiovascular;  Laterality: N/A;  . CHOLECYSTECTOMY N/A 03/13/2016   Procedure: LAPAROSCOPIC CHOLECYSTECTOMY WITH INTRAOPERATIVE CHOLANGIOGRAM;  Surgeon: Christene Lye, MD;  Location: [facility] ORS;  Service: General;  Laterality: N/A;  . CHOLECYSTECTOMY, LAPAROSCOPIC  03/13/16   Infection - lengthy diagnosis d/t lack of gallstones  . COLONOSCOPY  10/2013  . CORONARY ANGIOPLASTY     STENTS  . Cypher Stent  10/04   mid LAD- gupta  . LEFT HEART CATHETERIZATION WITH CORONARY ANGIOGRAM N/A 12/17/2012   Procedure: LEFT HEART CATHETERIZATION WITH CORONARY ANGIOGRAM;   Surgeon: Peter M Martinique, MD;  Location: [facility] CATH LAB;  Service: Cardiovascular;  Laterality: N/A;  . PERCUTANEOUS CORONARY STENT INTERVENTION (PCI-S) Right 12/17/2012   Procedure: PERCUTANEOUS CORONARY STENT INTERVENTION (PCI-S);  Surgeon: Peter M Martinique, MD;  [facility] C[facility];  Service: Cardiovascular;  Laterality: Right;    Family History  Problem Relation Age of Onset  . Diabetes Father   . Coronary artery disease Father        CABG - 5 vessel 15 yr ago  . Heart attack Paternal Uncle        Age 42  . Heart attack Maternal Uncle        Age 57  . Coronary artery disease Maternal Grandfather   . Heart attack Maternal Grandfather        Died in 105s  . Throat cancer Maternal Grandfather   . Hypertension Mother   . Alzheimer's disease Maternal Grandmother   . Hypertension Brother   . Colon cancer Neg Hx   . Esophageal cancer Neg Hx   . Rectal cancer Neg Hx   . Stomach cancer Neg Hx     Social History   Socioeconomic History  . Marital status: Married    Spouse name: Not on file  . Number of children: 2  . Years of education: Not on file  . Highest education level: Not on file  Occupational History  . Occupation: Writer    Comment: part time  . 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 Smoking status: Never Smoker  . Smokeless tobacco: Never Used  Vaping Use  . Vaping Use: Never used  Substance and Sexual Activity  . Alcohol use: No    Alcohol/week: 0.0 standard drinks  . Drug use: No  . Sexual activity: Yes    Partners: Female  Other Topics Concern  . 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 Difficulty of Paying Living Expenses: Not on file  Food Insecurity:   . Worried About Charity fundraiser in the Last Year: Not on file  . Ran Out of Food in the Last Year: Not on file    Transportation Needs:   . Lack of Transportation (Medical): Not on file  . Lack of Transportation (Non-Medical): Not on file  Physical Activity:   . Days of Exercise per Week: Not on file  . Minutes of Exercise per Session: Not on file  Stress:   . Feeling of Stress : Not on file  Social Connections:   . Frequency of Communication with Friends and Family: Not on file  . Frequency of Social Gatherings with Friends and Family: Not on file  . Attends Religious Services: Not on file  . Active Member of Clubs or  Organizations: Not on file  . Attends Archivist Meetings: Not on file  . Marital Status: Not on file  Intimate Partner Violence:   . Fear of Current or Ex-Partner: Not on file  . Emotionally Abused: Not on file  . Physically Abused: Not on file  . Sexually Abused: Not on file   Review of Systems Constant tinnitus---no change over years Poor hearing on right----no recent change     Objective:   Physical Exam Constitutional:      Appearance: Normal appearance.  Eyes:     Comments: No nystagmus  Cardiovascular:     Rate and Rhythm: Normal rate and regular rhythm.     Heart sounds: No murmur heard.  No gallop.   Pulmonary:     Effort: Pulmonary effort is normal.     Breath sounds: Normal breath sounds. No wheezing or rales.  Musculoskeletal:     Cervical back: Neck supple.     Right lower leg: No edema.     Left lower leg: No edema.  Lymphadenopathy:     Cervical: No cervical adenopathy.  Neurological:     Mental Status: He is alert.     Coordination: Coordination normal.     Gait: Gait normal.     Comments: Romberg absent            Assessment & Plan:

## 2020-08-27 NOTE — Assessment & Plan Note (Signed)
BP Readings from Last 3 Encounters:  08/27/20 (!) 146/96  08/24/20 (!) 144/90  08/21/20 118/84   Persistent elevation now--and at home Will add low dose metoprolol to the losartan 100mg 

## 2020-08-27 NOTE — Telephone Encounter (Signed)
I spoke to patient and he scheduled appointment today at 4:15.

## 2020-08-29 ENCOUNTER — Ambulatory Visit: Payer: BC Managed Care – PPO | Admitting: Internal Medicine

## 2020-12-03 ENCOUNTER — Other Ambulatory Visit: Payer: Self-pay | Admitting: Cardiovascular Disease

## 2020-12-04 ENCOUNTER — Telehealth: Payer: Self-pay | Admitting: Cardiovascular Disease

## 2020-12-04 NOTE — Telephone Encounter (Signed)
Patient's wife called, states Brilinta is too expensive and needs an alternative. Please call to discuss.

## 2020-12-04 NOTE — Telephone Encounter (Addendum)
DPR on file spoke with the patients wife.  Patients wife sts that the patient went to pick up his Brilinta prescription and declined it due to the cost. Pt wife sts that it would cost the patient $75 a month which is not affordable for the patient. The patient currently has several days of Brilinta samples on hand. Pt wife asked if we have any discount cards on hand. Adv her that I will leave one at the front desk to be picked up.  Adv the pt wife that it is important that the patient not miss any doses of Brilinta. They are to contact the office if the discount card cannot be used so that an alternative can be discussed.  Pt wife verbalized understanding and voiced appreciation for the call back

## 2020-12-05 MED ORDER — CLOPIDOGREL BISULFATE 75 MG PO TABS: 75.0000 mg | ORAL_TABLET | Freq: Every day | ORAL | 1 refills | Status: DC

## 2020-12-05 NOTE — Telephone Encounter (Signed)
Switch Brilinta to Plavix 75 mg once daily.  

## 2020-12-05 NOTE — Telephone Encounter (Signed)
Patients wife calling back in after speaking with [facility]. Wife was instructed that with his medicare insurance plan the discount card cannot be used. Patients wife did mention the prior conversation of a Plavix Rx  Please advise patient as wife will be unavailable today

## 2020-12-05 NOTE — Telephone Encounter (Signed)
Patient made aware of Dr. Jari Sportsman response and recommendation. Rx for Plavix 75 mg daily sent to the patients pharmacy.

## 2020-12-05 NOTE — Addendum Note (Signed)
Addended by: Jarvis Newcomer on: 12/05/2020 01:19 PM   Modules accepted: Orders

## 2021-01-22 ENCOUNTER — Other Ambulatory Visit: Payer: Self-pay | Admitting: Cardiovascular Disease

## 2021-01-22 NOTE — Telephone Encounter (Signed)
Attempted to schedule.  LMOV to call office.  ° °

## 2021-01-22 NOTE — Telephone Encounter (Signed)
Please schedule 12 month F/U appointment. Thank you! 

## 2021-01-24 NOTE — Telephone Encounter (Signed)
Rx request sent to pharmacy.  

## 2021-01-24 NOTE — Telephone Encounter (Signed)
Patient is scheduled for 3/1 and out of medication.

## 2021-01-29 ENCOUNTER — Ambulatory Visit (INDEPENDENT_AMBULATORY_CARE_PROVIDER_SITE_OTHER): Payer: Medicare PPO | Admitting: Cardiovascular Disease

## 2021-01-29 ENCOUNTER — Other Ambulatory Visit: Payer: Self-pay

## 2021-01-29 ENCOUNTER — Encounter: Payer: Self-pay | Admitting: Cardiovascular Disease

## 2021-01-29 VITALS — BP 142/90 | HR 59 | Ht 71.0 in | Wt 244.0 lb

## 2021-01-29 DIAGNOSIS — E785 Hyperlipidemia, unspecified: Secondary | ICD-10-CM

## 2021-01-29 DIAGNOSIS — I2510 Atherosclerotic heart disease of native coronary artery without angina pectoris: Secondary | ICD-10-CM | POA: Diagnosis not present

## 2021-01-29 DIAGNOSIS — I10 Essential (primary) hypertension: Secondary | ICD-10-CM

## 2021-01-29 MED ORDER — ATORVASTATIN CALCIUM 80 MG PO TABS: 80.0000 mg | ORAL_TABLET | Freq: Every day | ORAL | 3 refills | Status: DC

## 2021-01-29 MED ORDER — CARVEDILOL 6.25 MG PO TABS: 6.2500 mg | ORAL_TABLET | Freq: Two times a day (BID) | ORAL | 3 refills | Status: DC

## 2021-01-29 MED ORDER — LOSARTAN POTASSIUM 100 MG PO TABS: 100.0000 mg | ORAL_TABLET | Freq: Every day | ORAL | 3 refills | Status: DC

## 2021-01-29 MED ORDER — CLOPIDOGREL BISULFATE 75 MG PO TABS: 75.0000 mg | ORAL_TABLET | Freq: Every day | ORAL | 3 refills | Status: DC

## 2021-01-29 NOTE — Patient Instructions (Signed)
Medication Instructions:  Your physician has recommended you make the following change in your medication:   STOP Metoprolol  START Carvedilol 6.25 mg twice daily. An Rx has been sent to your pharmacy.  *If you need a refill on your cardiac medications before your next appointment, please call your pharmacy*   Lab Work: None ordered If you have labs (blood work) drawn today and your tests are completely normal, you will receive your results only by: Marland Kitchen MyChart Message (if you have MyChart) OR . A paper copy in the mail If you have any lab test that is abnormal or we need to change your treatment, we will call you to review the results.   Testing/Procedures: None ordered   Follow-Up: At [facility], you and your health needs are our priority.  As part of our continuing mission to provide you with exceptional heart care, we have created designated Provider Care Teams.  These Care Teams include your primary Cardiologist (physician) and Advanced Practice Providers (APPs -  Physician Assistants and Nurse Practitioners) who all work together to provide you with the care you need, when you need it.  We recommend signing up for the patient portal called "MyChart".  Sign up information is provided on this After Visit Summary.  MyChart is used to connect with patients for Virtual Visits (Telemedicine).  Patients are able to view lab/test results, encounter notes, upcoming appointments, etc.  Non-urgent messages can be sent to your provider as well.   To learn more about what you can do with MyChart, go to [email or web address].    Your next appointment:   Your physician wants you to follow-up in: 1 year You will receive a reminder letter in the mail two months in advance. If you don't receive a letter, please call our office to schedule the follow-up appointment.   The format for your next appointment:   In Person  Provider:   You may see [name], MD or one of the following  Advanced Practice Providers on your designated Care Team:    Murray Hodgkins, NP  Christell Faith, PA-C  Marrianne Mood, PA-C  Cadence [place], [place]  Laurann [place], NP    Other Instructions N/A

## 2021-01-29 NOTE — Progress Notes (Signed)
Cardiology Office Note   Date:  01/29/2021   ID:  Javonte, Elenes [birth year], MRN [id number]  PCP:  Venia Carbon, MD  Cardiologist:   [name], MD   Chief Complaint  Patient presents with  . 12 month follow up     "doing well." Medications reviewed by the patient verbally.       History of Present Illness: Matthew Doyle is a 65 y.o. male who presents for a follow-up visit regarding coronary artery disease.  He is s/p angioplasty and drug-eluting stent placement to the LAD in 2004 and PCI and drug-eluting stent placement to OM 2 in January 2014 after a small non-ST elevation myocardial infarction. Cardiac catheterization at that time showed patent LAD stent, occluded right PDA distally supplying a small territory and 99% proximal OM 2 stenosis .  Ejection fraction was normal. He presented in July, 2017 with anterolateral ST elevation myocardial infarction complicated by ventricular fibrillation. Cardiac catheterization showed subtotal thrombotic occlusion of proximal LAD at the diagonal bifurcation and also thrombus in the mid left circumflex. He underwent aspiration thrombectomy of the diagonal and left circumflex and drug-eluting stent placement to the proximal LAD. LV gram showed an ejection fraction of 45% but subsequent echo showed normal LV systolic function.  He had cervical myelopathy and underwent cervical spine surgery in November 2017.  He has essential tremors  He has been doing well with no chest pain, shortness of breath or palpitations.  His blood pressure has been running high and he was started on Toprol by Dr. Silvio Pate last year.     Past Medical History:  Diagnosis Date  . Arthritis    knee  . Atrial fibrillation ([place]) post MI   . CAD (coronary artery disease)    a. 09/2003 Cath/PCI: LAD 80%->3.0x23 Cypher DES;  b. 03/2004 Cath ;  c. NSTEMI 12/2012 cath patent LAD stent, included distal right PDA supplying a small territory, 99% proximal OM 2  status post PCI and DES placement  d. cath 06/20/2016 anterolateral STEMI stent to LAD, PCTA to diag and LCx  . Colon polyps   . Diverticulosis   . Dysrhythmia   . Gout   . Hemorrhoids   . Hyperlipidemia   . Hypertension   . Internal hemorrhoids without mention of complication   . Myocardial infarction ([place])    LAST 12/16/12  . Sleep apnea   . ST elevation (STEMI) myocardial infarction involving left anterior descending coronary artery ([place]) 06/20/2016    Past Surgical History:  Procedure Laterality Date  . BACK SURGERY  10/20/2016   Cervical fusion--Dr.Chester Johnnette Gourd  . CARDIAC CATHETERIZATION  2014   s/p stent  . CARDIAC CATHETERIZATION N/A 06/20/2016   Procedure: Left Heart Cath and Coronary Angiography;  Surgeon: Sherren Mocha, MD;  Location: [place] [facility];  Service: Cardiovascular;  Laterality: N/A;  . CARDIAC CATHETERIZATION N/A 06/20/2016   Procedure: Coronary Stent Intervention;  Surgeon: Sherren Mocha, MD;  Location: [place] [facility];  Service: Cardiovascular;  Laterality: N/A;  . CHOLECYSTECTOMY N/A 03/13/2016   Procedure: LAPAROSCOPIC CHOLECYSTECTOMY WITH INTRAOPERATIVE CHOLANGIOGRAM;  Surgeon: Christene Lye, MD;  Location: [facility] ORS;  Service: General;  Laterality: N/A;  . CHOLECYSTECTOMY, LAPAROSCOPIC  03/13/16   Infection - lengthy diagnosis d/t lack of gallstones  . COLONOSCOPY  10/2013  . CORONARY ANGIOPLASTY     STENTS  . Cypher Stent  10/04   mid LAD- gupta  . LEFT HEART CATHETERIZATION WITH CORONARY ANGIOGRAM N/A 12/17/2012  Procedure: LEFT HEART CATHETERIZATION WITH CORONARY ANGIOGRAM;  Surgeon: Peter M Martinique, MD;  Location: [facility] CATH LAB;  Service: Cardiovascular;  Laterality: N/A;  . PERCUTANEOUS CORONARY STENT INTERVENTION (PCI-S) Right 12/17/2012   Procedure: PERCUTANEOUS CORONARY STENT INTERVENTION (PCI-S);  Surgeon: Peter M Martinique, MD;  Location: [facility] CATH LAB;  Service: Cardiovascular;  Laterality: Right;     Current Outpatient Medications   Medication Sig Dispense Refill  . aspirin 81 MG tablet Take 1 tablet (81 mg total) by mouth daily. 30 tablet   . atorvastatin (LIPITOR) 80 MG tablet Take 1 tablet (80 mg total) by mouth daily. Please keep your upcoming appointment for refills. 30 tablet 0  . clopidogrel (PLAVIX) 75 MG tablet Take 1 tablet (75 mg total) by mouth daily. 90 tablet 1  . losartan (COZAAR) 100 MG tablet Take 1 tablet (100 mg total) by mouth daily. 90 tablet 3  . meclizine (ANTIVERT) 12.5 MG tablet Take 1 tablet (12.5 mg total) by mouth 3 (three) times daily as needed for dizziness. 30 tablet 0  . metoprolol succinate (TOPROL-XL) 25 MG 24 hr tablet Take 1 tablet (25 mg total) by mouth daily. 90 tablet 3  . nitroGLYCERIN (NITROSTAT) 0.4 MG SL tablet Place 1 tablet (0.4 mg total) under the tongue every 5 (five) minutes as needed for chest pain. 25 tablet 1   No current facility-administered medications for this visit.    Allergies:   Lisinopril    Social History:  The patient  reports that he has never smoked. He has never used smokeless tobacco. He reports that he does not drink alcohol and does not use drugs.   Family History:  The patient's family history includes Alzheimer's disease in his maternal grandmother; Coronary artery disease in his father and maternal grandfather; Diabetes in his father; Heart attack in his maternal grandfather, maternal uncle, and paternal uncle; Hypertension in his brother and mother; Throat cancer in his maternal grandfather.    ROS:  Please see the history of present illness.   Otherwise, review of systems are positive for none.   All other systems are reviewed and negative.    PHYSICAL EXAM: VS:  BP (!) 142/90 (BP Location: Right Arm, Patient Position: Sitting, Cuff Size: Normal)   Pulse (!) 59   Ht 5\' 11"  (1.803 m)   Wt 244 lb (110.7 kg)   SpO2 98%   BMI 34.03 kg/m  , BMI Body mass index is 34.03 kg/m. GEN: Well nourished, well developed, in no acute distress  HEENT:  normal  Neck: no JVD, carotid bruits, or masses Cardiac: RRR; no murmurs, rubs, or gallops,no edema  Respiratory:  clear to auscultation bilaterally, normal work of breathing GI: soft, nontender, nondistended, + BS MS: no deformity or atrophy  Skin: warm and dry, no rash Neuro:  Strength and sensation are intact Psych: euthymic mood, full affect   EKG:  EKG is ordered today. EKG showed sinus bradycardia with no significant ST or T wave changes.   Recent Labs: 05/31/2020: ALT 22; BUN 18; Creatinine, Ser 1.13; Hemoglobin 15.6; Platelets 220.0; Potassium 4.2; Sodium 142    Lipid Panel    Component Value Date/Time   CHOL 106 05/31/2020 1650   CHOL 91 (L) 03/31/2013 0914   TRIG 82.0 05/31/2020 1650   HDL 34.10 (L) 05/31/2020 1650   HDL 36 (L) 03/31/2013 0914   CHOLHDL 3 05/31/2020 1650   VLDL 16.4 05/31/2020 1650   LDLCALC 55 05/31/2020 1650   LDLCALC 47 03/31/2013 0914  Wt Readings from Last 3 Encounters:  01/29/21 244 lb (110.7 kg)  08/27/20 234 lb (106.1 kg)  08/21/20 236 lb (107 kg)       ASSESSMENT AND PLAN:  1.  Coronary artery disease involving native coronary arteries without angina: He is doing well overall with no anginal symptoms. Continue medical therapy.    Given multiple ischemic cardiac events and high thrombus burden on most recent event, the plan is to keep him on lifelong dual antiplatelet therapy.   He was switched last year from Brilinta to Plavix due to cost issues.  2. Essential hypertension: Blood pressure is still not controlled in spite of the addition of Toprol.  I elected to switch him from Toprol to carvedilol 6.25 mg twice daily.  3. Hyperlipidemia: Continue high dose atorvastatin .    Most recent lipid profile showed an LDL of 55.  4.  Sleep apnea: He reports poor tolerance to CPAP which might be contributing to his daytime fatigue.   Disposition: Follow-up in 12 months.  Signed,  [name], MD  01/29/2021 4:06 PM    [facility]

## 2021-02-22 ENCOUNTER — Other Ambulatory Visit: Payer: Self-pay | Admitting: Cardiovascular Disease

## 2021-04-17 ENCOUNTER — Other Ambulatory Visit (INDEPENDENT_AMBULATORY_CARE_PROVIDER_SITE_OTHER): Payer: Medicare PPO

## 2021-04-17 ENCOUNTER — Encounter: Payer: Self-pay | Admitting: Gastroenterology

## 2021-04-17 ENCOUNTER — Ambulatory Visit: Payer: Medicare PPO | Admitting: Gastroenterology

## 2021-04-17 VITALS — BP 138/82 | HR 58 | Ht 71.0 in | Wt 240.2 lb

## 2021-04-17 DIAGNOSIS — R109 Unspecified abdominal pain: Secondary | ICD-10-CM

## 2021-04-17 DIAGNOSIS — R1030 Lower abdominal pain, unspecified: Secondary | ICD-10-CM

## 2021-04-17 DIAGNOSIS — R1011 Right upper quadrant pain: Secondary | ICD-10-CM

## 2021-04-17 LAB — COMPREHENSIVE METABOLIC PANEL
ALT: 20 U/L (ref 0–53)
AST: 15 U/L (ref 0–37)
Albumin: 4.2 g/dL (ref 3.5–5.2)
Alkaline Phosphatase: 54 U/L (ref 39–117)
BUN: 20 mg/dL (ref 6–23)
CO2: 27 mEq/L (ref 19–32)
Calcium: 9 mg/dL (ref 8.4–10.5)
Chloride: 106 mEq/L (ref 96–112)
Creatinine, Ser: 1.06 mg/dL (ref 0.40–1.50)
GFR: 73.61 mL/min (ref >60.00)
Glucose, Bld: 120 mg/dL — ABNORMAL HIGH (ref 70–99)
Potassium: 4 mEq/L (ref 3.5–5.1)
Sodium: 141 mEq/L (ref 135–145)
Total Bilirubin: 0.7 mg/dL (ref 0.2–1.2)
Total Protein: 7.2 g/dL (ref 6.0–8.3)

## 2021-04-17 LAB — CBC WITH DIFFERENTIAL/PLATELET
Basophils Absolute: 0 10*3/uL (ref 0.0–0.1)
Basophils Relative: 0.7 % (ref 0.0–3.0)
Eosinophils Absolute: 0.2 10*3/uL (ref 0.0–0.7)
Eosinophils Relative: 3.4 % (ref 0.0–5.0)
HCT: 43.3 % (ref 39.0–52.0)
Hemoglobin: 15 g/dL (ref 13.0–17.0)
Lymphocytes Relative: 27.2 % (ref 12.0–46.0)
Lymphs Abs: 1.8 10*3/uL (ref 0.7–4.0)
MCHC: 34.5 g/dL (ref 30.0–36.0)
MCV: 83.9 fl (ref 78.0–100.0)
Monocytes Absolute: 0.7 10*3/uL (ref 0.1–1.0)
Monocytes Relative: 10.6 % (ref 3.0–12.0)
Neutro Abs: 3.8 10*3/uL (ref 1.4–7.7)
Neutrophils Relative %: 58.1 % (ref 43.0–77.0)
Platelets: 184 10*3/uL (ref 150.0–400.0)
RBC: 5.16 Mil/uL (ref 4.22–5.81)
RDW: 14.1 % (ref 11.5–15.5)
WBC: 6.5 10*3/uL (ref 4.0–10.5)

## 2021-04-17 MED ORDER — DICYCLOMINE HCL 20 MG PO TABS: 20.0000 mg | ORAL_TABLET | Freq: Two times a day (BID) | ORAL | 1 refills | Status: DC

## 2021-04-17 NOTE — Patient Instructions (Signed)
If you are age 66 or older, your body mass index should be between 23-30. Your Body mass index is 33.51 kg/m. If this is out of the aforementioned range listed, please consider follow up with your Primary Care Provider.  If you are age 38 or younger, your body mass index should be between 19-25. Your Body mass index is 33.51 kg/m. If this is out of the aformentioned range listed, please consider follow up with your Primary Care Provider.   Your provider has requested that you go to the basement level for lab work before leaving today. Press "B" on the elevator. The lab is located at the first door on the left as you exit the elevator.  We have sent the following medications to your pharmacy for you to pick up at your convenience:  You have been scheduled for a CT scan of the abdomen and pelvis at [facility], 1st floor Radiology. You are scheduled on 04/24/21  at 12pm. You should arrive 15 minutes prior to your appointment time for registration.   Please follow the written instructions below on the day of your exam:   1) Do not eat anything after 8am (4 hours prior to your test)   2) Drink 1 bottle of contrast @ 10am (2 hours prior to your exam)  Remember to shake well before drinking and do NOT pour over ice.     Drink 1 bottle of contrast @ 11am (1 hour prior to your exam)   You may take any medications as prescribed with a small amount of water, if necessary. If you take any of the following medications: METFORMIN, GLUCOPHAGE, GLUCOVANCE, AVANDAMET, RIOMET, FORTAMET, [place] MET, JANUMET, GLUMETZA or METAGLIP, you MAY be asked to HOLD this medication 48 hours AFTER the exam.   The purpose of you drinking the oral contrast is to aid in the visualization of your intestinal tract. The contrast solution may cause some diarrhea. Depending on your individual set of symptoms, you may also receive an intravenous injection of x-ray contrast/dye. Plan on being at [facility] for 45 minutes or  longer, depending on the type of exam you are having performed.   If you have any questions regarding your exam or if you need to reschedule, you may call [facility] at [phone] between the hours of 8:00 am and 5:00 pm, Monday-Friday.   We have sent the following medications to your pharmacy for you to pick up at your convenience: Dicyclomine 20 mg   Due to recent changes in healthcare laws, you may see the results of your imaging and laboratory studies on MyChart before your provider has had a chance to review them.  We understand that in some cases there may be results that are confusing or concerning to you. Not all laboratory results come back in the same time frame and the provider may be waiting for multiple results in order to interpret others.  Please give [place] 48 hours in order for your provider to thoroughly review all the results before contacting the office for clarification of your results.   Thank you for choosing me and [facility].  Alonza Bogus, PA-C

## 2021-04-17 NOTE — Progress Notes (Signed)
04/17/2021 Matthew Doyle [id number] 06-Dec-1955   HISTORY OF PRESENT ILLNESS: This is a 65 year old male who is a patient of Dr. Lynne Leader.  He is here today with complaints of abdominal pain.  He tells me that he has had been having recurrent abdominal pain for over a year, but for the past 2 to 3 weeks it has been present consistently.  He says that he has pain across his lower abdomen and then is very sore and tender and the right upper side and along his right flank.  He says that that it can reach about an 8 out of 10 at times, but is currently just a dull type pain.  He says that in the past when he would get it it would have him down for about 2 to 3 days and then would ease up and go away.  Michela Pitcher it does wake him up from sleep at night at times.  He has had no nausea or vomiting.  He had diarrhea for a couple of days, but otherwise his bowel movements are normal.  He denies any heartburn/reflux type symptoms.  He said that one night he felt feverish, but not did not take his temperature and that resolved without recurrence.  He is status post cholecystectomy in 2017 for gallbladder dysfunction, not stones.  The pain does not seem to be affected by eating, but he has had much less of an appetite recently just from generally not feeling well.  He says that he mentioned it to his PCP last year, but the pain was not present at the time so they kind of brushed over it.  He had colonoscopy in 04/2019 that showed   - The perianal and digital rectal examinations were normal. - A 6 mm polyp was found in the ascending colon. The polyp was sessile. The polyp was removed with a cold snare. Resection and retrieval were complete.  Was a tubular adenoma. - There was a medium-sized lipoma, 15 mm in diameter, in the ascending colon. - Multiple medium-mouthed diverticula were found in the left colon. There was no evidence of diverticular bleeding. - Internal hemorrhoids were found during retroflexion. The  hemorrhoids were small and Grade I (internal hemorrhoids that do not prolapse). - The exam was otherwise without abnormality on direct and retroflexion views.    Past Medical History:  Diagnosis Date  . Arthritis    knee  . Atrial fibrillation ([place]) post MI   . CAD (coronary artery disease)    a. 09/2003 Cath/PCI: LAD 80%->3.0x23 Cypher DES;  b. 03/2004 Cath ;  c. NSTEMI 12/2012 cath patent LAD stent, included distal right PDA supplying a small territory, 99% proximal OM 2 status post PCI and DES placement  d. cath 06/20/2016 anterolateral STEMI stent to LAD, PCTA to diag and LCx  . Colon polyps   . Diverticulosis   . Dysrhythmia   . Gout   . Hemorrhoids   . Hyperlipidemia   . Hypertension   . Internal hemorrhoids without mention of complication   . Myocardial infarction ([place])    LAST 12/16/12  . Sleep apnea   . ST elevation (STEMI) myocardial infarction involving left anterior descending coronary artery ([place]) 06/20/2016   Past Surgical History:  Procedure Laterality Date  . BACK SURGERY  10/20/2016   Cervical fusion--Dr.Chester Johnnette Gourd  . CARDIAC CATHETERIZATION  2014   s/p stent  . CARDIAC CATHETERIZATION N/A 06/20/2016   Procedure: Left Heart Cath and Coronary Angiography;  Surgeon: Legrand Como  Burt Knack, MD;  Location: [place] [facility];  Service: Cardiovascular;  Laterality: N/A;  . CARDIAC CATHETERIZATION N/A 06/20/2016   Procedure: Coronary Stent Intervention;  Surgeon: Sherren Mocha, MD;  Location: [place] [facility];  Service: Cardiovascular;  Laterality: N/A;  . CHOLECYSTECTOMY N/A 03/13/2016   Procedure: LAPAROSCOPIC CHOLECYSTECTOMY WITH INTRAOPERATIVE CHOLANGIOGRAM;  Surgeon: Christene Lye, MD;  Location: [facility] ORS;  Service: General;  Laterality: N/A;  . CHOLECYSTECTOMY, LAPAROSCOPIC  03/13/16   Infection - lengthy diagnosis d/t lack of gallstones  . COLONOSCOPY  10/2013  . CORONARY ANGIOPLASTY     STENTS  . Cypher Stent  10/04   mid LAD- gupta  . LEFT HEART  CATHETERIZATION WITH CORONARY ANGIOGRAM N/A 12/17/2012   Procedure: LEFT HEART CATHETERIZATION WITH CORONARY ANGIOGRAM;  Surgeon: Peter M Martinique, MD;  Location[facility] CATH LAB;  Service: Cardiovascular;  Laterality: N/A;  . PERCUTANEOUS CORONARY STENT INTERVENTION (PCI-S) Right 12/17/2012   Procedure: PERCUTANEOUS CORONARY STENT INTERVENTION (PCI-S);  Surgeon: Peter M Martinique, MD;  Location: [facility] CATH LAB;  Service: Cardiovascular;  Laterality: Right;    reports that he has never smoked. He has never used smokeless tobacco. He reports that he does not drink alcohol and does not use drugs. family history includes Alzheimer's disease in his maternal grandmother; Coronary artery disease in his father and maternal grandfather; Diabetes in his father; Heart attack in his maternal grandfather, maternal uncle, and paternal uncle; Hypertension in his brother and mother; Throat cancer in his maternal grandfather. Allergies  Allergen Reactions  . Lisinopril Cough    REACTION: cough with this and/or benazepril      Outpatient Encounter Medications as of 04/17/2021  Medication Sig  . aspirin 81 MG tablet Take 1 tablet (81 mg total) by mouth daily.  Marland Kitchen atorvastatin (LIPITOR) 80 MG tablet Take 1 tablet (80 mg total) by mouth daily.  . carvedilol (COREG) 6.25 MG tablet Take 1 tablet (6.25 mg total) by mouth 2 (two) times daily.  . clopidogrel (PLAVIX) 75 MG tablet Take 1 tablet (75 mg total) by mouth daily.  Marland Kitchen losartan (COZAAR) 100 MG tablet Take 1 tablet (100 mg total) by mouth daily.  . meclizine (ANTIVERT) 12.5 MG tablet Take 1 tablet (12.5 mg total) by mouth 3 (three) times daily as needed for dizziness.  . nitroGLYCERIN (NITROSTAT) 0.4 MG SL tablet Place 1 tablet (0.4 mg total) under the tongue every 5 (five) minutes as needed for chest pain.   No facility-administered encounter medications on file as of 04/17/2021.     REVIEW OF SYSTEMS  : All other systems reviewed and negative except where noted in the  History of Present Illness.   PHYSICAL EXAM: BP 138/82 (BP Location: Left Arm, Patient Position: Sitting, Cuff Size: Normal)   Pulse (!) 58   Ht 5\' 11"  (1.803 m)   Wt 240 lb 4 oz (109 kg)   SpO2 97%   BMI 33.51 kg/m  General: Well developed white male in no acute distress Head: Normocephalic and atraumatic Eyes:  Sclerae anicteric, conjunctiva pink. Ears: Normal auditory acuity Lungs: Clear throughout to auscultation; no W/R/R. Heart: Regular rate and rhythm; no M/R/G. Abdomen: Soft, non-distended.  BS present.  Mild RUQ, right flank, and lower abdominal TTP. Musculoskeletal: Symmetrical with no gross deformities  Skin: No lesions on visible extremities Extremities: No edema  Neurological: Alert oriented x 4, grossly non-focal Psychological:  Alert and cooperative. Normal mood and affect  ASSESSMENT AND PLAN: *65 year old male with complaints of right upper quadrant abdominal  pain, right flank pain, lower abdominal pain that has been intermittent for the past several months, but more persistent and more significant over the past 2 to 3 weeks.  Really no other associated symptoms.  Has some decreased appetite, but pain is not necessarily affected by eating.  Due to the diffuse nature of his pain we will start with a CT scan of the abdomen and pelvis with p.o. contrast only due to shortage of IV contrast at this time.  We will check a CBC and CMP since he has no lab work performed since July 2021.  Advised that he could try Tylenol, heating pad, over-the-counter lidocaine/Salonpas patches.  We will also try dicyclomine 20 mg twice daily.  Prescription sent to pharmacy.   CC:  Venia Carbon, MD

## 2021-04-17 NOTE — Progress Notes (Signed)
Reviewed and agree with management plan.  Harrell Niehoff T. Kurt Hoffmeier, MD FACG [phone]  

## 2021-04-18 ENCOUNTER — Telehealth: Payer: Self-pay | Admitting: Gastroenterology

## 2021-04-18 NOTE — Telephone Encounter (Signed)
Returned patients call. I switched patients appointment to 05/06/21 @ 8am  to [facility] . After calling and notifying patient that it would be June 6 patient decided that he wanted to keep his appointment a [facility] on 04/24/21 @ 12:00pm

## 2021-04-18 NOTE — Telephone Encounter (Signed)
Inbound call from patient's wife stating [facility] is closer to them and wants to know if patient's CT scan can be done there instead.  Please advise.

## 2021-04-24 ENCOUNTER — Other Ambulatory Visit: Payer: Self-pay

## 2021-04-24 ENCOUNTER — Ambulatory Visit (HOSPITAL_COMMUNITY)
Admission: RE | Admit: 2021-04-24 | Discharge: 2021-04-24 | Disposition: A | Discharge status: Home / Self Care | Payer: Medicare PPO | Source: Ambulatory Visit | Attending: Gastroenterology | Admitting: Gastroenterology

## 2021-04-24 ENCOUNTER — Ambulatory Visit (HOSPITAL_COMMUNITY): Payer: Medicare PPO

## 2021-04-24 DIAGNOSIS — R1011 Right upper quadrant pain: Secondary | ICD-10-CM | POA: Insufficient documentation

## 2021-04-24 DIAGNOSIS — R109 Unspecified abdominal pain: Secondary | ICD-10-CM | POA: Insufficient documentation

## 2021-04-24 DIAGNOSIS — R1030 Lower abdominal pain, unspecified: Secondary | ICD-10-CM | POA: Insufficient documentation

## 2021-05-06 ENCOUNTER — Ambulatory Visit: Payer: Medicare PPO

## 2021-08-27 ENCOUNTER — Encounter: Payer: Self-pay | Admitting: Internal Medicine

## 2021-08-27 ENCOUNTER — Other Ambulatory Visit: Payer: Self-pay

## 2021-08-27 ENCOUNTER — Ambulatory Visit (INDEPENDENT_AMBULATORY_CARE_PROVIDER_SITE_OTHER): Payer: Medicare PPO | Admitting: Internal Medicine

## 2021-08-27 VITALS — BP 138/84 | HR 57 | Temp 97.5°F | Ht 70.0 in | Wt 247.0 lb

## 2021-08-27 DIAGNOSIS — K581 Irritable bowel syndrome with constipation: Secondary | ICD-10-CM

## 2021-08-27 DIAGNOSIS — I2510 Atherosclerotic heart disease of native coronary artery without angina pectoris: Secondary | ICD-10-CM | POA: Diagnosis not present

## 2021-08-27 DIAGNOSIS — Z0000 Encounter for general adult medical examination without abnormal findings: Secondary | ICD-10-CM | POA: Diagnosis not present

## 2021-08-27 DIAGNOSIS — Z7189 Other specified counseling: Secondary | ICD-10-CM

## 2021-08-27 DIAGNOSIS — I10 Essential (primary) hypertension: Secondary | ICD-10-CM | POA: Diagnosis not present

## 2021-08-27 DIAGNOSIS — G20 Parkinson's disease: Secondary | ICD-10-CM

## 2021-08-27 DIAGNOSIS — Z23 Encounter for immunization: Secondary | ICD-10-CM | POA: Diagnosis not present

## 2021-08-27 LAB — COMPREHENSIVE METABOLIC PANEL
ALT: 15 U/L (ref 0–53)
AST: 14 U/L (ref 0–37)
Albumin: 4.2 g/dL (ref 3.5–5.2)
Alkaline Phosphatase: 58 U/L (ref 39–117)
BUN: 13 mg/dL (ref 6–23)
CO2: 30 mEq/L (ref 19–32)
Calcium: 9.4 mg/dL (ref 8.4–10.5)
Chloride: 104 mEq/L (ref 96–112)
Creatinine, Ser: 1.1 mg/dL (ref 0.40–1.50)
GFR: 70.23 mL/min (ref >60.00)
Glucose, Bld: 118 mg/dL — ABNORMAL HIGH (ref 70–99)
Potassium: 4.5 mEq/L (ref 3.5–5.1)
Sodium: 140 mEq/L (ref 135–145)
Total Bilirubin: 1 mg/dL (ref 0.2–1.2)
Total Protein: 6.8 g/dL (ref 6.0–8.3)

## 2021-08-27 LAB — LIPID PANEL
Cholesterol: 93 mg/dL (ref 0–200)
HDL: 33.2 mg/dL — ABNORMAL LOW (ref >39.00)
LDL Cholesterol: 46 mg/dL (ref 0–99)
NonHDL: 60.24
Total CHOL/HDL Ratio: 3
Triglycerides: 69 mg/dL (ref 0.0–149.0)
VLDL: 13.8 mg/dL (ref 0.0–40.0)

## 2021-08-27 LAB — CBC
HCT: 44.8 % (ref 39.0–52.0)
Hemoglobin: 14.8 g/dL (ref 13.0–17.0)
MCHC: 33.1 g/dL (ref 30.0–36.0)
MCV: 85.6 fl (ref 78.0–100.0)
Platelets: 180 10*3/uL (ref 150.0–400.0)
RBC: 5.23 Mil/uL (ref 4.22–5.81)
RDW: 14.2 % (ref 11.5–15.5)
WBC: 6.3 10*3/uL (ref 4.0–10.5)

## 2021-08-27 NOTE — Assessment & Plan Note (Signed)
Will give forms to do

## 2021-08-27 NOTE — Assessment & Plan Note (Signed)
Some vague chest symptoms He will discuss with Dr Fletcher Anon On statin, beta blocker, ARB,ASA, plavix (probably doesn't need DAPT anymore)

## 2021-08-27 NOTE — Assessment & Plan Note (Signed)
Mild non progressive tremor No meds for now--no real functional impairment

## 2021-08-27 NOTE — Assessment & Plan Note (Signed)
Discussed fiber or senna S Can consider Rx if ongoing

## 2021-08-27 NOTE — Addendum Note (Signed)
Addended by: Pilar Grammes on: 08/27/2021 02:42 PM   Modules accepted: Orders

## 2021-08-27 NOTE — Progress Notes (Signed)
Hearing Screening   500Hz  1000Hz  2000Hz  4000Hz   Right ear 20 20 20  0  Left ear 20 20 20  0   Vision Screening   Right eye Left eye Both eyes  Without correction     With correction 20/20 20/20 20/20

## 2021-08-27 NOTE — Assessment & Plan Note (Signed)
I have personally reviewed the Medicare Annual Wellness questionnaire and have noted 1. The patient's medical and social history 2. Their use of alcohol, tobacco or illicit drugs 3. Their current medications and supplements 4. The patient's functional ability including ADL's, fall risks, home safety risks and hearing or visual             impairment. 5. Diet and physical activities 6. Evidence for depression or mood disorders  The patients weight, height, BMI and visual acuity have been recorded in the chart I have made referrals, counseling and provided education to the patient based review of the above and I have provided the pt with a written personalized care plan for preventive services.  I have provided you with a copy of your personalized plan for preventive services. Please take the time to review along with your updated medication list.  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

## 2021-08-27 NOTE — Progress Notes (Signed)
Subjective:    Patient ID: Matthew Doyle, male    DOB: [birth year], 65 y.o.   MRN: [id number]  HPI Here for Welcome to Medicare visit and follow up of chronic health conditions This visit occurred during the SARS-CoV-2 public health emergency.  Safety protocols were in place, including screening questions prior to the visit, additional usage of staff PPE, and extensive cleaning of exam room while observing appropriate contact time as indicated for disinfecting solutions.   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 Chronic tinnitus No tobacco No alcohol No set exercise Did fall at least once--no real injuries. 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) Still helps with grandkids  Ongoing stomach issues Stays constipated---laxative will "send it in the other direction"  Occasional vertigo Tends to be at night--in bed Uses the meclizine occasionally  Heart seems to be okay Will have rare tight feeling in chest "but nothing hurts" Hasn't used nitro No regular exercise---limited by feet and legs ("bad knees") No other dizziness and no syncope No edema No palpitations No SOB  Tremor is "just as bad" No worse but no better Both hands and at times in feet Did see Dr Tat in the past  Current Outpatient Medications on File Prior to Visit  Medication Sig Dispense Refill   aspirin 81 MG tablet Take 1 tablet (81 mg total) by mouth daily. 30 tablet    atorvastatin (LIPITOR) 80 MG tablet Take 1 tablet (80 mg total) by mouth daily. 90 tablet 3   carvedilol (COREG) 6.25 MG tablet Take 1 tablet (6.25 mg total) by  mouth 2 (two) times daily. 180 tablet 3   clopidogrel (PLAVIX) 75 MG tablet Take 1 tablet (75 mg total) by mouth daily. 90 tablet 3   losartan (COZAAR) 100 MG tablet Take 1 tablet (100 mg total) by mouth daily. 90 tablet 3   meclizine (ANTIVERT) 12.5 MG tablet Take 1 tablet (12.5 mg total) by mouth 3 (three) times daily as needed for dizziness. 30 tablet 0   nitroGLYCERIN (NITROSTAT) 0.4 MG SL tablet Place 1 tablet (0.4 mg total) under the tongue every 5 (five) minutes as needed for chest pain. 25 tablet 1   No current facility-administered medications on file prior to visit.    Allergies  Allergen Reactions   Lisinopril Cough    REACTION: cough with this and/or benazepril    Past Medical History:  Diagnosis Date   Arthritis    knee   Atrial fibrillation (HCC) post MI    CAD (coronary artery disease)    a. 09/2003 Cath/PCI: LAD 80%->3.0x23 Cypher DES;  b. 03/2004 Cath ;  c. NSTEMI 12/2012 cath patent LAD stent, included distal right PDA supplying a small territory, 99% proximal OM 2 status post PCI and DES placement  d. cath 06/20/2016 anterolateral STEMI stent to LAD, PCTA to diag and LCx   Colon polyps    Diverticulosis    Dysrhythmia    Gout    Hemorrhoids    Hyperlipidemia    Hypertension    Internal hemorrhoids without mention of complication    Myocardial infarction ([place])    LAST 12/16/12   Sleep apnea    ST elevation (STEMI) myocardial  infarction involving left anterior descending coronary artery ([place]) 06/20/2016    Past Surgical History:  Procedure Laterality Date   BACK SURGERY  10/20/2016   Cervical fusion--Dr.Chester Chili CATHETERIZATION  2014   s/p stent   CARDIAC CATHETERIZATION N/A 06/20/2016   Procedure: Left Heart Cath and Coronary Angiography;  Surgeon: Sherren Mocha, MD;  Location: [facility];  Service: Cardiovascular;  Laterality: N/A;   CARDIAC CATHETERIZATION N/A 06/20/2016   Procedure: Coronary Stent Intervention;  Surgeon: Sherren Mocha, MD;  Location: [facility];  Service: Cardiovascular;  Laterality: N/A;   CHOLECYSTECTOMY N/A 03/13/2016   Procedure: LAPAROSCOPIC CHOLECYSTECTOMY WITH INTRAOPERATIVE CHOLANGIOGRAM;  Surgeon: Christene Lye, MD;  Location: [facility] OR[facility];  Service: General;  Laterality: N/A;   CHOLECYSTECTOMY, LAPAROSCOPIC  03/13/16   Infection - lengthy diagnosis d/t lack of gallstones   COLONOSCOPY  10/2013   CORONARY ANGIOPLASTY     STENTS   Cypher Stent  10/04   mid LAD- gupta   LEFT HEART CATHETERIZATION WITH CORONARY ANGIOGRAM N/A 12/17/2012   Procedure: LEFT HEART CATHETERIZATION WITH CORONARY ANGIOGRAM;  Surgeon: Peter M Martinique, MD;  Location: [facility] CATH LAB;  Service: Cardiovascular;  Laterality: N/A;   PERCUTANEOUS CORONARY STENT INTERVENTION (PCI-S) Right 12/17/2012   Procedure: PERCUTANEOUS CORONARY STENT INTERVENTION (PCI-S);  Surgeon: Peter M Martinique, MD;  Location: [facility] CATH LAB;  Service: Cardiovascular;  Laterality: Right;    Family History  Problem Relation Age of Onset   Diabetes Father    Coronary artery disease Father        CABG - 5 vessel 69 yr ago   Heart attack Paternal Uncle        Age 77   Heart attack Maternal Uncle        Age 29   Coronary artery disease Maternal Grandfather    Heart attack Maternal Grandfather        Died in 65s   Throat cancer Maternal Grandfather    Hypertension Mother    Alzheimer's disease Maternal Grandmother    Hypertension Brother    Colon cancer Neg Hx    Esophageal cancer Neg Hx    Rectal cancer Neg Hx    Stomach cancer Neg Hx     Social History   Socioeconomic History   Marital status: Married    Spouse name: Not on file   Number of children: 2   Years of education: Not on file   Highest education level: Not on file  Occupational History   Occupation: Writer    Comment: part time   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  Tobacco Use   Smoking status: Never   Smokeless  tobacco: Never  Scientific laboratory technician Use: Never used  Substance and Sexual Activity   Alcohol use: No    Alcohol/week: 0.0 standard drinks   Drug use: No   Sexual activity: Yes    Partners: Female  Other Topics Concern   Not on file  Social History Narrative   Retired as Social research officer, government for [employer]      No living will   Wife would be health care POA---alternate is her sons   Would accept resuscitation attempts   Not sure about tube feeds      Social Determinants of Health   Financial Resource Strain: Not on file  Food Insecurity: Not on file  Transportation Needs: Not on file  Physical Activity: Not  on file  Stress: Not on file  Social Connections: Not on file  Intimate Partner Violence: Not on file   Review of Systems Appetite is okay---gets full easy Weight is fairly stable Sleep is interrupted by nocturia---awakens refreshed mostly Wears seat belt Dentist prn only---may need  upper denture No suspicious skin lesions No heartburn or dysphagia Bowels are okay--no blood No sig back pain---stiffness in neck Right shoulder pain---does have fair passive ROM without crepitus (discussed exercise)    Objective:   Physical Exam Constitutional:      Appearance: Normal appearance.  HENT:     Mouth/Throat:     Comments: No lesions Eyes:     Conjunctiva/sclera: Conjunctivae normal.     Pupils: Pupils are equal, round, and reactive to light.  Cardiovascular:     Rate and Rhythm: Normal rate and regular rhythm.     Pulses: Normal pulses.     Heart sounds: No murmur heard.   No gallop.  Pulmonary:     Effort: Pulmonary effort is normal.     Breath sounds: Normal breath sounds. No wheezing or rales.  Abdominal:     Palpations: Abdomen is soft.     Tenderness: There is no abdominal tenderness.  Lymphadenopathy:     Cervical: No cervical adenopathy.  Skin:    Findings: No lesion or rash.  Neurological:     Mental Status: He is alert and oriented to  person, place, and time.     Comments: President---"Joe Taiki, Buck[place]ter Obama" [phone] [id number] Recall 3/3  Psychiatric:        Mood and Affect: Mood normal.        Behavior: Behavior normal.           Assessment & Plan:

## 2021-08-27 NOTE — Patient Instructions (Signed)
Please try fiber (like citrucel or fibercon pills) or 1-2 senna-s daily to see if that helps your bowels be more regular.

## 2021-08-27 NOTE — Assessment & Plan Note (Signed)
BP Readings from Last 3 Encounters:  08/27/21 138/84  04/17/21 138/82  01/29/21 (!) 142/90   BP fine on carvedilol and losartan

## 2022-02-17 ENCOUNTER — Other Ambulatory Visit: Payer: Self-pay | Admitting: Cardiovascular Disease

## 2022-02-27 ENCOUNTER — Other Ambulatory Visit
Admission: RE | Admit: 2022-02-27 | Discharge: 2022-02-27 | Disposition: A | Discharge status: Home / Self Care | Payer: Medicare PPO | Source: Ambulatory Visit | Attending: Cardiovascular Disease | Admitting: Cardiovascular Disease

## 2022-02-27 ENCOUNTER — Ambulatory Visit: Payer: Medicare PPO | Admitting: Cardiovascular Disease

## 2022-02-27 ENCOUNTER — Encounter: Payer: Self-pay | Admitting: Cardiovascular Disease

## 2022-02-27 VITALS — BP 160/90 | HR 52 | Ht 71.0 in | Wt 255.4 lb

## 2022-02-27 DIAGNOSIS — E7800 Pure hypercholesterolemia, unspecified: Secondary | ICD-10-CM | POA: Diagnosis not present

## 2022-02-27 DIAGNOSIS — I25110 Atherosclerotic heart disease of native coronary artery with unstable angina pectoris: Secondary | ICD-10-CM | POA: Diagnosis not present

## 2022-02-27 DIAGNOSIS — I10 Essential (primary) hypertension: Secondary | ICD-10-CM | POA: Diagnosis not present

## 2022-02-27 LAB — CBC WITH DIFFERENTIAL/PLATELET
Abs Immature Granulocytes: 0.01 10*3/uL (ref 0.00–0.07)
Basophils Absolute: 0 10*3/uL (ref 0.0–0.1)
Basophils Relative: 0 %
Eosinophils Absolute: 0.2 10*3/uL (ref 0.0–0.5)
Eosinophils Relative: 3 %
HCT: 44.8 % (ref 39.0–52.0)
Hemoglobin: 14.8 g/dL (ref 13.0–17.0)
Immature Granulocytes: 0 %
Lymphocytes Relative: 30 %
Lymphs Abs: 2.3 10*3/uL (ref 0.7–4.0)
MCH: 27.9 pg (ref 26.0–34.0)
MCHC: 33 g/dL (ref 30.0–36.0)
MCV: 84.5 fL (ref 80.0–100.0)
Monocytes Absolute: 0.8 10*3/uL (ref 0.1–1.0)
Monocytes Relative: 10 %
Neutro Abs: 4.2 10*3/uL (ref 1.7–7.7)
Neutrophils Relative %: 57 %
Platelets: 234 10*3/uL (ref 150–400)
RBC: 5.3 MIL/uL (ref 4.22–5.81)
RDW: 13.3 % (ref 11.5–15.5)
WBC: 7.4 10*3/uL (ref 4.0–10.5)
nRBC: 0 % (ref 0.0–0.2)

## 2022-02-27 LAB — BASIC METABOLIC PANEL
Anion gap: 9 (ref 5–15)
BUN: 16 mg/dL (ref 8–23)
CO2: 26 mmol/L (ref 22–32)
Calcium: 9 mg/dL (ref 8.9–10.3)
Chloride: 105 mmol/L (ref 98–111)
Creatinine, Ser: 1.03 mg/dL (ref 0.61–1.24)
GFR, Estimated: >60 mL/min (ref >60)
Glucose, Bld: 134 mg/dL — ABNORMAL HIGH (ref 70–99)
Potassium: 4.1 mmol/L (ref 3.5–5.1)
Sodium: 140 mmol/L (ref 135–145)

## 2022-02-27 MED ORDER — NITROGLYCERIN 0.4 MG SL SUBL
0.4000 mg | SUBLINGUAL_TABLET | SUBLINGUAL | 1 refills | Status: AC | PRN
Start: 2022-02-27 — End: ?

## 2022-02-27 MED ORDER — ATORVASTATIN CALCIUM 80 MG PO TABS: 80.0000 mg | ORAL_TABLET | Freq: Every day | ORAL | 3 refills | Status: DC

## 2022-02-27 MED ORDER — LOSARTAN POTASSIUM 100 MG PO TABS
100.0000 mg | ORAL_TABLET | Freq: Every day | ORAL | 1 refills | Status: DC
Start: 2022-02-27 — End: 2022-11-17

## 2022-02-27 MED ORDER — CARVEDILOL 3.125 MG PO TABS: 3.1250 mg | ORAL_TABLET | Freq: Two times a day (BID) | ORAL | 1 refills | Status: DC

## 2022-02-27 MED ORDER — CLOPIDOGREL BISULFATE 75 MG PO TABS: ORAL_TABLET | ORAL | 1 refills | Status: DC

## 2022-02-27 MED ORDER — AMLODIPINE BESYLATE 2.5 MG PO TABS: 2.5000 mg | ORAL_TABLET | Freq: Every day | ORAL | 1 refills | Status: DC

## 2022-02-27 NOTE — Progress Notes (Signed)
?  ?Cardiology Office Note ? ? ?Date:  02/27/2022  ? ?ID:  Matthew Doyle, DOB October 11, 1956, MRN [id number] ? ?PCP:  Venia Carbon, MD  ?Cardiologist:   [name], MD  ? ?Chief Complaint  ?Patient presents with  ? Other  ?  12 Month f/u c/o midsternum pain and giving out/swimmy headed/lightheadedness when exerting himself. Meds reviewed verbally with pt.  ? ? ?  ?History of Present Illness: ?Matthew Doyle is a 66 y.o. male who presents for a follow-up visit regarding coronary artery disease. ? He is s/p angioplasty and drug-eluting stent placement to the LAD in 2004 and PCI and drug-eluting stent placement to OM 2 in January 2014 after a small non-ST elevation myocardial infarction. Cardiac catheterization at that time showed patent LAD stent, occluded right PDA distally supplying a small territory and 99% proximal OM 2 stenosis .  Ejection fraction was normal. ?He presented in July, 2017 with anterolateral ST elevation myocardial infarction complicated by ventricular fibrillation. Cardiac catheterization showed subtotal thrombotic occlusion of proximal LAD at the diagonal bifurcation and also thrombus in the mid left circumflex. He underwent aspiration thrombectomy of the diagonal and left circumflex and drug-eluting stent placement to the proximal LAD. LV gram showed an ejection fraction of 45% but subsequent echo showed normal LV systolic function. ?  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  He has essential tremors ? ?Over the last few weeks, he started having exertional substernal chest pain and tightness lasting for few minutes that has progressed and started happening at rest recently.  This has been associated with significant exertional dizziness.  He does not feel well and his symptoms are similar to his prior presentation with unstable angina on myocardial infarction. ? ?He has seen ENT in the past for vertigo but reports that the dizziness now is  different from his prior vertigo.  He takes his medications regularly.  No syncope. ? ? ? ? ?Past Medical History:  ?Diagnosis Date  ? Arthritis   ? knee  ? Atrial fibrillation ([place]) post MI   ? CAD (coronary artery disease)   ? a. 09/2003 Cath/PCI: LAD 80%->3.0x23 Cypher DES;  b. 03/2004 Cath ;  c. NSTEMI 12/2012 cath patent LAD stent, included distal right PDA supplying a small territory, 99% proximal OM 2 status post PCI and DES placement  d. cath 06/20/2016 anterolateral STEMI stent to LAD, PCTA to diag and LCx  ? Colon polyps   ? Diverticulosis   ? Dysrhythmia   ? Gout   ? Hemorrhoids   ? Hyperlipidemia   ? Hypertension   ? Internal hemorrhoids without mention of complication   ? Myocardial infarction [facility])   ? LAST 12/16/12  ? Sleep apnea   ? ST elevation (STEMI) myocardial infarction involving left anterior descending coronary artery ([place]) 06/20/2016  ? ? ?Past Surgical History:  ?Procedure Laterality Date  ? BACK SURGERY  10/20/2016  ? Cervical fusion--Dr.Chester Johnnette Gourd  ? CARDIAC CATHETERIZATION  2014  ? s/p stent  ? CARDIAC CATHETERIZATION N/A 06/20/2016  ? Procedure: Left Heart Cath and Coronary Angiography;  Surgeon: Sherren Mocha, MD;  Location: [facility];  Service: Cardiovascular;  Laterality: N/A;  ? CARDIAC CATHETERIZATION N/A 06/20/2016  ? Procedure: Coronary Stent Intervention;  Surgeon: Sherren Mocha, MD;  Location: [facility];  Service: Cardiovascular;  Laterality: N/A;  ? CHOLECYSTECTOMY N/A 03/13/2016  ? Procedure: LAPAROSCOPIC CHOLECYSTECTOMY WITH INTRAOPERATIVE CHOLANGIOGRAM;  Surgeon: Christene Lye, MD;  Location: [facility] ORS;  Service: General;  Laterality: N/A;  ? CHOLECYSTECTOMY, LAPAROSCOPIC  03/13/16  ? Infection - lengthy diagnosis d/t lack of gallstones  ? COLONOSCOPY  10/2013  ? CORONARY ANGIOPLASTY    ? STENTS  ? Cypher Stent  10/04  ? mid LAD- gupta  ? LEFT HEART CATHETERIZATION WITH CORONARY ANGIOGRAM N/A 12/17/2012  ? Procedure: LEFT HEART CATHETERIZATION WITH  CORONARY ANGIOGRAM;  Surgeon: Peter M Martinique, MD;  Location: [facility] CATH LAB;  Service: Cardiovascular;  Laterality: N/A;  ? PERCUTANEOUS CORONARY STENT INTERVENTION (PCI-S) Right 12/17/2012  ? Procedure: PERCUTANEOUS CORONARY STENT INTERVENTION (PCI-S);  Surgeon: Peter M Martinique, MD;  Location: [facility] CATH LAB;  Service: Cardiovascular;  Laterality: Right;  ? ? ? ?Current Outpatient Medications  ?Medication Sig Dispense Refill  ? aspirin 81 MG tablet Take 1 tablet (81 mg total) by mouth daily. 30 tablet   ? atorvastatin (LIPITOR) 80 MG tablet Take 1 tablet (80 mg total) by mouth daily. 90 tablet 3  ? carvedilol (COREG) 6.25 MG tablet TAKE 1 TABLET BY MOUTH TWICE A DAY. STOPMETOPROLOL 180 tablet 0  ? clopidogrel (PLAVIX) 75 MG tablet TAKE 1 TABLET BY MOUTH ONCE A DAY. STOP BRILINTA 90 tablet 0  ? losartan (COZAAR) 100 MG tablet TAKE 1 TABLET BY MOUTH ONCE A DAY 90 tablet 0  ? meclizine (ANTIVERT) 12.5 MG tablet Take 1 tablet (12.5 mg total) by mouth 3 (three) times daily as needed for dizziness. 30 tablet 0  ? nitroGLYCERIN (NITROSTAT) 0.4 MG SL tablet Place 1 tablet (0.4 mg total) under the tongue every 5 (five) minutes as needed for chest pain. 25 tablet 1  ? ?No current facility-administered medications for this visit.  ? ? ?Allergies:   Lisinopril  ? ? ?Social History:  The patient  reports that he has never smoked. He has never used smokeless tobacco. He reports that he does not drink alcohol and does not use drugs.  ? ?Family History:  The patient's family history includes Alzheimer's disease in his maternal grandmother; Coronary artery disease in his father and maternal grandfather; Diabetes in his father; Heart attack in his maternal grandfather, maternal uncle, and paternal uncle; Hypertension in his brother and mother; Throat cancer in his maternal grandfather.  ? ? ?ROS:  Please see the history of present illness.   Otherwise, review of systems are positive for none.   All other systems are reviewed and negative.   ? ? ?PHYSICAL EXAM: ?VS:  BP (!) 160/90 (BP Location: Left Arm, Patient Position: Sitting, Cuff Size: Normal)   Pulse (!) 52   Ht '5\' 11"'$  (1.803 m)   Wt 255 lb 6 oz (115.8 kg)   SpO2 98%   BMI 35.62 kg/m?  , BMI Body mass index is 35.62 kg/m?. ?GEN: Well nourished, well developed, in no acute distress  ?HEENT: normal  ?Neck: no JVD, carotid bruits, or masses ?Cardiac: RRR; no murmurs, rubs, or gallops,no edema  ?Respiratory:  clear to auscultation bilaterally, normal work of breathing ?GI: soft, nontender, nondistended, + BS ?MS: no deformity or atrophy  ?Skin: warm and dry, no rash ?Neuro:  Strength and sensation are intact ?Psych: euthymic mood, full affect ?Radial pulses normal bilaterally. ? ?EKG:  EKG is ordered today. ?EKG showed sinus bradycardia with no significant ST or T wave changes. ? ? ?Recent Labs: ?08/27/2021: ALT 15; BUN 13; Creatinine, Ser 1.10; Hemoglobin 14.8; Platelets 180.0; Potassium 4.5; Sodium 140  ? ? ?Lipid Panel ?   ?Component Value Date/Time  ?  CHOL 93 08/27/2021 0854  ? CHOL 91 (L) 03/31/2013 0914  ? TRIG 69.0 08/27/2021 0854  ? HDL 33.20 (L) 08/27/2021 0854  ? HDL 36 (L) 03/31/2013 0914  ? CHOLHDL 3 08/27/2021 0854  ? VLDL 13.8 08/27/2021 0854  ? [place] 46 08/27/2021 0854  ? [place] 47 03/31/2013 0914  ? ?  ? ?Wt Readings from Last 3 Encounters:  ?02/27/22 255 lb 6 oz (115.8 kg)  ?08/27/21 247 lb (112 kg)  ?04/17/21 240 lb 4 oz (109 kg)  ?  ? ? ? ?ASSESSMENT AND PLAN: ? ?1.  Coronary artery disease involving native coronary arteries with unstable angina: The patient presents with progressive exertional chest pain with recent episodes of pain at rest.  In addition, he reports new symptoms of severe exertional dizziness and fatigue.  He continues to be on dual antiplatelet therapy.   ?Given his known history of extensive coronary artery disease and multiple cardiac events and considering his current symptoms, I recommend proceeding with urgent left heart catheterization and possible  PCI.  I discussed the procedure in details as well as risks and benefits.  Planned access is via the right radial artery.   ?  ?2. Essential hypertension: His blood pressure is elevated and he continues to be brad

## 2022-02-27 NOTE — Patient Instructions (Signed)
Medication Instructions:  ?Your physician has recommended you make the following change in your medication:  ? ?REDUCE Carvedilol to 3.125 mg twice daily. An Rx has has been sent to your pharmacy ? ?START Amlodipine 2.5 mg daily. An Rx has been sent to your pharmacy ? ?Your cardiac medications have been refilled today. ? ?*If you need a refill on your cardiac medications before your next appointment, please call your pharmacy* ? ? ?Lab Work: ?Bmp and Cbc today. Please stop at the [facility] registration desk to check in for labs. ? ?If you have labs (blood work) drawn today and your tests are completely normal, you will receive your results only by: ?MyChart Message (if you have MyChart) OR ?A paper copy in the mail ?If you have any lab test that is abnormal or we need to change your treatment, we will call you to review the results. ? ? ?Testing/Procedures: ?Your physician has requested that you have a cardiac catheterization. Cardiac catheterization is used to diagnose and/or treat various heart conditions. Doctors may recommend this procedure for a number of different reasons. The most common reason is to evaluate chest pain. Chest pain can be a symptom of coronary artery disease (CAD), and cardiac catheterization can show whether plaque is narrowing or blocking your heart?s arteries. This procedure is also used to evaluate the valves, as well as measure the blood flow and oxygen levels in different parts of your heart. For further information please visit [email or web address]. Please follow instruction sheet, as given.  ? ? ?Follow-Up: ?At [facility], you and your health needs are our priority.  As part of our continuing mission to provide you with exceptional heart care, we have created designated Provider Care Teams.  These Care Teams include your primary Cardiologist (physician) and Advanced Practice Providers (APPs -  Physician Assistants and Nurse Practitioners) who all work together to provide  you with the care you need, when you need it. ? ?We recommend signing up for the patient portal called "MyChart".  Sign up information is provided on this After Visit Summary.  MyChart is used to connect with patients for Virtual Visits (Telemedicine).  Patients are able to view lab/test results, encounter notes, upcoming appointments, etc.  Non-urgent messages can be sent to your provider as well.   ?To learn more about what you can do with MyChart, go to [email or web address].   ? ?Your next appointment:   ?4 week(s) ? ?The format for your next appointment:   ?In Person ? ?Provider:   ?You may see [name], MD or one of the following Advanced Practice Providers on your designated Care Team:   ?Murray Hodgkins, NP ?Christell Faith, PA-C ?Cadence Kathlen Mody, PA-C{ ? ? ? ? ?Other Instructions ? ?[place] ?[facility] ?[address], [address] ?[place] [address] ?Dept: [phone] ?Loc: [phone] ? ?OTHAR CURTO  02/27/2022 ? ?You are scheduled for a Cardiac Catheterization on Friday, March 31 with Dr. [name]. ? ?1. Please arrive at the [facility] [place], [place] [address] 11:30 AM (This time is one hour before your procedure to ensure your preparation). Free valet parking service is available.  ? ?Special note: Every effort is made to have your procedure done on time. Please understand that emergencies sometimes delay scheduled procedures. ? ?2. Diet: Do not eat solid foods after midnight.  You may have clear liquids until 5 AM upon the day of the procedure. ? ?3.  Labs: You will need to have blood drawn on Thursday, March 30 at [facility], Go to 1st desk on your right to register.  ?Address: [address], [place] [address]  ?Open: 8am - 5pm  Phone: [phone]. You do not need to be fasting. ? ?4. Medication instructions in preparation for your procedure: ? ? Contrast Allergy:  No ? ?On the morning of your procedure, take Aspirin and any morning medicines NOT listed above.  You may use sips of water. ? ?5. Plan to go home the same day, you will only stay overnight if medically necessary. ?6. You MUST have a responsible adult to drive you home. ?7. An adult MUST be with you the first 24 hours after you arrive home. ?8. Bring a current list of your medications, and the last time and date medication taken. ?9. Bring ID and current insurance cards. ?10.Please wear clothes that are easy to get on and off and wear slip-on shoes. ? ?Thank you for allowing [place] to care for you! ?  -- [facility] ? ? ?

## 2022-02-27 NOTE — H&P (View-Only) (Signed)
?  ?Cardiology Office Note ? ? ?Date:  02/27/2022  ? ?ID:  Matthew Doyle, DOB 08-Mar-1956, MRN [id number] ? ?PCP:  Venia Carbon, MD  ?Cardiologist:   [name], MD  ? ?Chief Complaint  ?Patient presents with  ? Other  ?  12 Month f/u c/o midsternum pain and giving out/swimmy headed/lightheadedness when exerting himself. Meds reviewed verbally with pt.  ? ? ?  ?History of Present Illness: ?Matthew Doyle is a 66 y.o. male who presents for a follow-up visit regarding coronary artery disease. ? He is s/p angioplasty and drug-eluting stent placement to the LAD in 2004 and PCI and drug-eluting stent placement to OM 2 in January 2014 after a small non-ST elevation myocardial infarction. Cardiac catheterization at that time showed patent LAD stent, occluded right PDA distally supplying a small territory and 99% proximal OM 2 stenosis .  Ejection fraction was normal. ?He presented in July, 2017 with anterolateral ST elevation myocardial infarction complicated by ventricular fibrillation. Cardiac catheterization showed subtotal thrombotic occlusion of proximal LAD at the diagonal bifurcation and also thrombus in the mid left circumflex. He underwent aspiration thrombectomy of the diagonal and left circumflex and drug-eluting stent placement to the proximal LAD. LV gram showed an ejection fraction of 45% but subsequent echo showed normal LV systolic function. ?  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  He has essential tremors ? ?Over the last few weeks, he started having exertional substernal chest pain and tightness lasting for few minutes that has progressed and started happening at rest recently.  This has been associated with significant exertional dizziness.  He does not feel well and his symptoms are similar to his prior presentation with unstable angina on myocardial infarction. ? ?He has seen ENT in the past for vertigo but reports that the dizziness now is  different from his prior vertigo.  He takes his medications regularly.  No syncope. ? ? ? ? ?Past Medical History:  ?Diagnosis Date  ? Arthritis   ? knee  ? Atrial fibrillation ([place]) post MI   ? CAD (coronary artery disease)   ? a. 09/2003 Cath/PCI: LAD 80%->3.0x23 Cypher DES;  b. 03/2004 Cath ;  c. NSTEMI 12/2012 cath patent LAD stent, included distal right PDA supplying a small territory, 99% proximal OM 2 status post PCI and DES placement  d. cath 06/20/2016 anterolateral STEMI stent to LAD, PCTA to diag and LCx  ? Colon polyps   ? Diverticulosis   ? Dysrhythmia   ? Gout   ? Hemorrhoids   ? Hyperlipidemia   ? Hypertension   ? Internal hemorrhoids without mention of complication   ? Myocardial infarction [facility])   ? LAST 12/16/12  ? Sleep apnea   ? ST elevation (STEMI) myocardial infarction involving left anterior descending coronary artery ([place]) 06/20/2016  ? ? ?Past Surgical History:  ?Procedure Laterality Date  ? BACK SURGERY  10/20/2016  ? Cervical fusion--Dr.Chester Johnnette Gourd  ? CARDIAC CATHETERIZATION  2014  ? s/p stent  ? CARDIAC CATHETERIZATION N/A 06/20/2016  ? Procedure: Left Heart Cath and Coronary Angiography;  Surgeon: Sherren Mocha, MD;  Location: [place] CV LAB;  Service: Cardiovascular;  Laterality: N/A;  ? CARDIAC CATHETERIZATION N/A 06/20/2016  ? Procedure: Coronary Stent Intervention;  Surgeon: Sherren Mocha, MD;  Location: [place] CV LAB;  Service: Cardiovascular;  Laterality: N/A;  ? CHOLECYSTECTOMY N/A 03/13/2016  ? Procedure: LAPAROSCOPIC CHOLECYSTECTOMY WITH INTRAOPERATIVE CHOLANGIOGRAM;  Surgeon: Christene Lye, MD;  Location: [facility] ORS;  Service: General;  Laterality: N/A;  ? CHOLECYSTECTOMY, LAPAROSCOPIC  03/13/16  ? Infection - lengthy diagnosis d/t lack of gallstones  ? COLONOSCOPY  10/2013  ? CORONARY ANGIOPLASTY    ? STENTS  ? Cypher Stent  10/04  ? mid LAD- gupta  ? LEFT HEART CATHETERIZATION WITH CORONARY ANGIOGRAM N/A 12/17/2012  ? Procedure: LEFT HEART CATHETERIZATION WITH  CORONARY ANGIOGRAM;  Surgeon: Peter M Martinique, MD;  Location: [facility] CATH LAB;  Service: Cardiovascular;  Laterality: N/A;  ? PERCUTANEOUS CORONARY STENT INTERVENTION (PCI-S) Right 12/17/2012  ? Procedure: PERCUTANEOUS CORONARY STENT INTERVENTION (PCI-S);  Surgeon: Peter M Martinique, MD;  Location: [facility] CATH LAB;  Service: Cardiovascular;  Laterality: Right;  ? ? ? ?Current Outpatient Medications  ?Medication Sig Dispense Refill  ? aspirin 81 MG tablet Take 1 tablet (81 mg total) by mouth daily. 30 tablet   ? atorvastatin (LIPITOR) 80 MG tablet Take 1 tablet (80 mg total) by mouth daily. 90 tablet 3  ? carvedilol (COREG) 6.25 MG tablet TAKE 1 TABLET BY MOUTH TWICE A DAY. STOPMETOPROLOL 180 tablet 0  ? clopidogrel (PLAVIX) 75 MG tablet TAKE 1 TABLET BY MOUTH ONCE A DAY. STOP BRILINTA 90 tablet 0  ? losartan (COZAAR) 100 MG tablet TAKE 1 TABLET BY MOUTH ONCE A DAY 90 tablet 0  ? meclizine (ANTIVERT) 12.5 MG tablet Take 1 tablet (12.5 mg total) by mouth 3 (three) times daily as needed for dizziness. 30 tablet 0  ? nitroGLYCERIN (NITROSTAT) 0.4 MG SL tablet Place 1 tablet (0.4 mg total) under the tongue every 5 (five) minutes as needed for chest pain. 25 tablet 1  ? ?No current facility-administered medications for this visit.  ? ? ?Allergies:   Lisinopril  ? ? ?Social History:  The patient  reports that he has never smoked. He has never used smokeless tobacco. He reports that he does not drink alcohol and does not use drugs.  ? ?Family History:  The patient's family history includes Alzheimer's disease in his maternal grandmother; Coronary artery disease in his father and maternal grandfather; Diabetes in his father; Heart attack in his maternal grandfather, maternal uncle, and paternal uncle; Hypertension in his brother and mother; Throat cancer in his maternal grandfather.  ? ? ?ROS:  Please see the history of present illness.   Otherwise, review of systems are positive for none.   All other systems are reviewed and negative.   ? ? ?PHYSICAL EXAM: ?VS:  BP (!) 160/90 (BP Location: Left Arm, Patient Position: Sitting, Cuff Size: Normal)   Pulse (!) 52   Ht '5\' 11"'$  (1.803 m)   Wt 255 lb 6 oz (115.8 kg)   SpO2 98%   BMI 35.62 kg/m?  , BMI Body mass index is 35.62 kg/m?. ?GEN: Well nourished, well developed, in no acute distress  ?HEENT: normal  ?Neck: no JVD, carotid bruits, or masses ?Cardiac: RRR; no murmurs, rubs, or gallops,no edema  ?Respiratory:  clear to auscultation bilaterally, normal work of breathing ?GI: soft, nontender, nondistended, + BS ?MS: no deformity or atrophy  ?Skin: warm and dry, no rash ?Neuro:  Strength and sensation are intact ?Psych: euthymic mood, full affect ?Radial pulses normal bilaterally. ? ?EKG:  EKG is ordered today. ?EKG showed sinus bradycardia with no significant ST or T wave changes. ? ? ?Recent Labs: ?08/27/2021: ALT 15; BUN 13; Creatinine, Ser 1.10; Hemoglobin 14.8; Platelets 180.0; Potassium 4.5; Sodium 140  ? ? ?Lipid Panel ?   ?Component Value Date/Time  ?  CHOL 93 08/27/2021 0854  ? CHOL 91 (L) 03/31/2013 0914  ? TRIG 69.0 08/27/2021 0854  ? HDL 33.20 (L) 08/27/2021 0854  ? HDL 36 (L) 03/31/2013 0914  ? CHOLHDL 3 08/27/2021 0854  ? VLDL 13.8 08/27/2021 0854  ? [place] 46 08/27/2021 0854  ? [place] 47 03/31/2013 0914  ? ?  ? ?Wt Readings from Last 3 Encounters:  ?02/27/22 255 lb 6 oz (115.8 kg)  ?08/27/21 247 lb (112 kg)  ?04/17/21 240 lb 4 oz (109 kg)  ?  ? ? ? ?ASSESSMENT AND PLAN: ? ?1.  Coronary artery disease involving native coronary arteries with unstable angina: The patient presents with progressive exertional chest pain with recent episodes of pain at rest.  In addition, he reports new symptoms of severe exertional dizziness and fatigue.  He continues to be on dual antiplatelet therapy.   ?Given his known history of extensive coronary artery disease and multiple cardiac events and considering his current symptoms, I recommend proceeding with urgent left heart catheterization and possible  PCI.  I discussed the procedure in details as well as risks and benefits.  Planned access is via the right radial artery.   ?  ?2. Essential hypertension: His blood pressure is elevated and he continues to be brad

## 2022-02-28 ENCOUNTER — Encounter
Admission: RE | Disposition: A | Discharge status: Home / Self Care | Payer: Medicare PPO | Source: Ambulatory Visit | Attending: Cardiovascular Disease

## 2022-02-28 ENCOUNTER — Encounter: Payer: Self-pay | Admitting: Cardiovascular Disease

## 2022-02-28 ENCOUNTER — Ambulatory Visit
Admission: RE | Admit: 2022-02-28 | Discharge: 2022-02-28 | Disposition: A | Discharge status: Home / Self Care | Payer: Medicare PPO | Source: Ambulatory Visit | Attending: Cardiovascular Disease | Admitting: Cardiovascular Disease

## 2022-02-28 DIAGNOSIS — Z955 Presence of coronary angioplasty implant and graft: Secondary | ICD-10-CM | POA: Insufficient documentation

## 2022-02-28 DIAGNOSIS — Z7902 Long term (current) use of antithrombotics/antiplatelets: Secondary | ICD-10-CM | POA: Insufficient documentation

## 2022-02-28 DIAGNOSIS — Z79899 Other long term (current) drug therapy: Secondary | ICD-10-CM | POA: Insufficient documentation

## 2022-02-28 DIAGNOSIS — Z7982 Long term (current) use of aspirin: Secondary | ICD-10-CM | POA: Insufficient documentation

## 2022-02-28 DIAGNOSIS — R001 Bradycardia, unspecified: Secondary | ICD-10-CM | POA: Diagnosis not present

## 2022-02-28 DIAGNOSIS — G4730 Sleep apnea, unspecified: Secondary | ICD-10-CM | POA: Insufficient documentation

## 2022-02-28 DIAGNOSIS — E785 Hyperlipidemia, unspecified: Secondary | ICD-10-CM | POA: Diagnosis not present

## 2022-02-28 DIAGNOSIS — I252 Old myocardial infarction: Secondary | ICD-10-CM | POA: Diagnosis not present

## 2022-02-28 DIAGNOSIS — I10 Essential (primary) hypertension: Secondary | ICD-10-CM | POA: Diagnosis not present

## 2022-02-28 DIAGNOSIS — I25110 Atherosclerotic heart disease of native coronary artery with unstable angina pectoris: Secondary | ICD-10-CM

## 2022-02-28 DIAGNOSIS — I200 Unstable angina: Secondary | ICD-10-CM

## 2022-02-28 HISTORY — PX: LEFT HEART CATH AND CORONARY ANGIOGRAPHY: CATH118249

## 2022-02-28 SURGERY — LEFT HEART CATH AND CORONARY ANGIOGRAPHY
Anesthesia: Moderate Sedation | Laterality: Left

## 2022-02-28 MED ORDER — LIDOCAINE HCL 1 % IJ SOLN
INTRAMUSCULAR | Status: AC
  Filled 2022-02-28: qty 20

## 2022-02-28 MED ORDER — MIDAZOLAM HCL 2 MG/2ML IJ SOLN
INTRAMUSCULAR | Status: DC | PRN
  Administered 2022-02-28: 1 mg via INTRAVENOUS

## 2022-02-28 MED ORDER — HEPARIN (PORCINE) IN NACL 1000-0.9 UT/500ML-% IV SOLN
INTRAVENOUS | Status: DC | PRN
  Administered 2022-02-28: 1000 mL

## 2022-02-28 MED ORDER — LIDOCAINE HCL (PF) 1 % IJ SOLN
INTRAMUSCULAR | Status: DC | PRN
  Administered 2022-02-28: 2 mL

## 2022-02-28 MED ORDER — SODIUM CHLORIDE 0.9 % IV SOLN: 250.0000 mL | INTRAVENOUS | Status: DC | PRN

## 2022-02-28 MED ORDER — SODIUM CHLORIDE 0.9 % IV SOLN: INTRAVENOUS | Status: DC

## 2022-02-28 MED ORDER — ACETAMINOPHEN 325 MG PO TABS: 650.0000 mg | ORAL_TABLET | ORAL | Status: DC | PRN

## 2022-02-28 MED ORDER — FENTANYL CITRATE (PF) 100 MCG/2ML IJ SOLN
INTRAMUSCULAR | Status: DC | PRN
  Administered 2022-02-28: 50 ug via INTRAVENOUS

## 2022-02-28 MED ORDER — HEPARIN (PORCINE) IN NACL 1000-0.9 UT/500ML-% IV SOLN
INTRAVENOUS | Status: AC
  Filled 2022-02-28: qty 1000

## 2022-02-28 MED ORDER — ASPIRIN 81 MG PO CHEW: 81.0000 mg | CHEWABLE_TABLET | ORAL | Status: DC

## 2022-02-28 MED ORDER — SODIUM CHLORIDE 0.9% FLUSH: 3.0000 mL | INTRAVENOUS | Status: DC | PRN

## 2022-02-28 MED ORDER — VERAPAMIL HCL 2.5 MG/ML IV SOLN
INTRAVENOUS | Status: AC
  Filled 2022-02-28: qty 2

## 2022-02-28 MED ORDER — MIDAZOLAM HCL 2 MG/2ML IJ SOLN
INTRAMUSCULAR | Status: AC
  Filled 2022-02-28: qty 2

## 2022-02-28 MED ORDER — SODIUM CHLORIDE 0.9% FLUSH: 3.0000 mL | Freq: Two times a day (BID) | INTRAVENOUS | Status: DC

## 2022-02-28 MED ORDER — HEPARIN SODIUM (PORCINE) 1000 UNIT/ML IJ SOLN
INTRAMUSCULAR | Status: DC | PRN
  Administered 2022-02-28: 5000 [IU] via INTRAVENOUS

## 2022-02-28 MED ORDER — ONDANSETRON HCL 4 MG/2ML IJ SOLN: 4.0000 mg | Freq: Four times a day (QID) | INTRAMUSCULAR | Status: DC | PRN

## 2022-02-28 MED ORDER — HEPARIN SODIUM (PORCINE) 1000 UNIT/ML IJ SOLN
INTRAMUSCULAR | Status: AC
  Filled 2022-02-28: qty 10

## 2022-02-28 MED ORDER — VERAPAMIL HCL 2.5 MG/ML IV SOLN
INTRAVENOUS | Status: DC | PRN
  Administered 2022-02-28: 2.5 mg via INTRA_ARTERIAL

## 2022-02-28 MED ORDER — FENTANYL CITRATE (PF) 100 MCG/2ML IJ SOLN
INTRAMUSCULAR | Status: AC
  Filled 2022-02-28: qty 2

## 2022-02-28 SURGICAL SUPPLY — 12 items
BAND CMPR LRG ZPHR (HEMOSTASIS) ×1
BAND ZEPHYR COMPRESS 30 LONG (HEMOSTASIS) ×1 IMPLANT
CATH INFINITI 5FR ANG PIGTAIL (CATHETERS) ×1 IMPLANT
CATH INFINITI 5FR JK (CATHETERS) ×1 IMPLANT
DRAPE BRACHIAL (DRAPES) ×1 IMPLANT
GLIDESHEATH SLEND SS 6F .021 (SHEATH) ×1 IMPLANT
GUIDEWIRE INQWIRE 1.5J.035X260 (WIRE) IMPLANT
INQWIRE 1.5J .035X260CM (WIRE) ×2
PACK CARDIAC CATH (CUSTOM PROCEDURE TRAY) ×2 IMPLANT
PROTECTION STATION PRESSURIZED (MISCELLANEOUS) ×2
SET ATX SIMPLICITY (MISCELLANEOUS) ×1 IMPLANT
STATION PROTECTION PRESSURIZED (MISCELLANEOUS) IMPLANT

## 2022-02-28 NOTE — Interval H&P Note (Signed)
History and Physical Interval Note: ? ?02/28/2022 ?12:44 PM ? ?Matthew Doyle  has presented today for surgery, with the diagnosis of LT Cath w possible PCI    Unstable angina.  The various methods of treatment have been discussed with the patient and family. After consideration of risks, benefits and other options for treatment, the patient has consented to  Procedure(s): ?LEFT HEART CATH AND CORONARY ANGIOGRAPHY (Left) as a surgical intervention.  The patient's history has been reviewed, patient examined, no change in status, stable for surgery.  I have reviewed the patient's chart and labs.  Questions were answered to the patient's satisfaction.   ? ? ?[name] ? ? ?

## 2022-03-03 ENCOUNTER — Encounter: Payer: Self-pay | Admitting: Cardiovascular Disease

## 2022-04-01 ENCOUNTER — Encounter: Payer: Self-pay | Admitting: Nurse Practitioner

## 2022-04-01 ENCOUNTER — Ambulatory Visit: Payer: Medicare PPO | Admitting: Nurse Practitioner

## 2022-04-01 VITALS — BP 150/90 | HR 57 | Ht 70.5 in | Wt 252.0 lb

## 2022-04-01 DIAGNOSIS — E785 Hyperlipidemia, unspecified: Secondary | ICD-10-CM | POA: Diagnosis not present

## 2022-04-01 DIAGNOSIS — I2510 Atherosclerotic heart disease of native coronary artery without angina pectoris: Secondary | ICD-10-CM | POA: Diagnosis not present

## 2022-04-01 DIAGNOSIS — G4733 Obstructive sleep apnea (adult) (pediatric): Secondary | ICD-10-CM | POA: Diagnosis not present

## 2022-04-01 DIAGNOSIS — I10 Essential (primary) hypertension: Secondary | ICD-10-CM | POA: Diagnosis not present

## 2022-04-01 MED ORDER — AMLODIPINE BESYLATE 5 MG PO TABS: 5.0000 mg | ORAL_TABLET | Freq: Every day | ORAL | 3 refills | Status: DC

## 2022-04-01 NOTE — Progress Notes (Signed)
? ? ?Office Visit  ?  ?Patient Name: Matthew Doyle ?Date of Encounter: 04/01/2022 ? ?Primary Care Provider:  Venia Carbon, MD ?Primary Cardiologist:  [name], MD ? ?Chief Complaint  ?  ?66 year old male with a history of CAD status post prior LAD and OM 2 stenting, hypertension, hyperlipidemia, sleep apnea, and cervical myelopathy, who presents for follow-up after recent diagnostic catheterization. ? ?Past Medical History  ?  ?Past Medical History:  ?Diagnosis Date  ? Arthritis   ? knee  ? Atrial fibrillation ([place]) post MI   ? CAD (coronary artery disease)   ? a. 09/2003 Cath/PCI: LAD 80% (3.0x23 Cypher DES);  b. NSTEMI 12/2012 cath patent LAD stent, included distal right PDA supplying a small territory, 99% proximal OM 2 (DES); c. 06/20/2016 [place] STEMI/PCI: Stent to LAD, PTCA D1 & LCX; d. 01/2022 Cath: LM mild dzs, LAD 10p/m ISR, D1 80, LCX 27m OM1 patent, RCA 20p, RPDA 99 w/ R->R collats from AM. EF 55-65%-->Med Rx.  ? Colon polyps   ? Diverticulosis   ? Gout   ? Hemorrhoids   ? Hyperlipidemia   ? Hypertension   ? Internal hemorrhoids without mention of complication   ? Myocardial infarction ([facility]   ? Sleep apnea   ? ST elevation (STEMI) myocardial infarction involving left anterior descending coronary artery [place] [place] 06/20/2016  ? ?Past Surgical History:  ?Procedure Laterality Date  ? BACK SURGERY  10/20/2016  ? Cervical fusion--Dr.Chester YJohnnette Gourd ? CARDIAC CATHETERIZATION  2014  ? s/p stent  ? CARDIAC CATHETERIZATION N/A 06/20/2016  ? Procedure: Left Heart Cath and Coronary Angiography;  Surgeon: MSherren Mocha MD;  [place];  Service: Cardiovascular;  Laterality: N/A;  ? CARDIAC CATHETERIZATION N/A 06/20/2016  ? Procedure: Coronary Stent Intervention;  Surgeon: MSherren Mocha MD;  [place] [place];  Service: Cardiovascular;  Laterality: N/A;  ? CHOLECYSTECTOMY N/A 03/13/2016  ? Procedure: LAPAROSCOPIC CHOLECYSTECTOMY WITH INTRAOPERATIVE CHOLANGIOGRAM;  Surgeon:  SChristene Lye MD;  Location: [facility] ORS;  Service: General;  Laterality: N/A;  ? CHOLECYSTECTOMY, LAPAROSCOPIC  03/13/16  ? Infection - lengthy diagnosis d/t lack of gallstones  ? COLONOSCOPY  10/2013  ? CORONARY ANGIOPLASTY    ? STENTS  ? Cypher Stent  10/04  ? mid LAD- gupta  ? LEFT HEART CATH AND CORONARY ANGIOGRAPHY Left 02/28/2022  ? Procedure: LEFT HEART CATH AND CORONARY ANGIOGRAPHY;  Surgeon: AWellington Hampshire MD;  [place];  Service: Cardiovascular;  Laterality: Left;  ? LEFT HEART CATHETERIZATION WITH CORONARY ANGIOGRAM N/A 12/17/2012  ? Procedure: LEFT HEART CATHETERIZATION WITH CORONARY ANGIOGRAM;  Surgeon: Peter M JMartinique MD;  [facility];  Service: Cardiovascular;  Laterality: N/A;  ? PERCUTANEOUS CORONARY STENT INTERVENTION (PCI-S) Right 12/17/2012  ? Procedure: PERCUTANEOUS CORONARY STENT INTERVENTION (PCI-S);  Surgeon: Peter M JMartinique MD;  [facility];  Service: Cardiovascular;  Laterality: Right;  ? ? ?Allergies ? ?Allergies  ?Allergen Reactions  ? Lisinopril Cough  ?  REACTION: cough with this and/or benazepril  ? ? ?History of Present Illness  ?  ?66year old male with above past medical history including CAD, hypertension, hyperlipidemia, sleep apnea, and cervical myelopathy.  He previously underwent drug-eluting stent placement to the LAD in 2004 with non-STEMI in January 2014 requiring drug-eluting stent placement to the second obtuse marginal.  In July 2017, he presented with anterolateral STEMI complicated by VF arrest.  Catheterization showed a subtotal thrombotic occlusion of the proximal LAD at the  diagonal bifurcation as well as thrombus in the mid left circumflex.  He underwent aspiration thrombectomy of the diagonal and left circumflex and drug-eluting stent placement to the proximal LAD.  EF was 45% at that time however, subsequent echocardiogram showed normal LV systolic function. ? ?He was recently seen in cardiology clinic on March 30  with complaints of exertional substernal chest pain and tightness as well as dizziness and fatigue.  He was noted to be bradycardic and his carvedilol dose was reduced to 3.125 mg twice daily.  Low-dose amlodipine was added in the setting of hypertension.  Given progressive symptoms, decision was made to pursue diagnostic catheterization, which was performed March 31, and showed patent LAD stents with 80% diagonal stenosis, nonobstructive circumflex disease, and a subtotal occlusion of the RPDA with right to right collaterals from an acute marginal.  Anatomy was overall stable and recommendation was made for aggressive medical therapy.  Since his catheterization, he has continued to have daily fatigue and low energy.  He denies chest pain.  His chronic, stable dyspnea exertion.  He seems to be more bothered by constipation than anything.  With constipation, he sometimes goes several days without a bowel movement and this leads to abdominal bloating and discomfort, which keeps him up at night.  In addition, he has sleep apnea but does not tolerate CPAP so overall, does not sleep well.  He recognizes that this likely contributes to his daytime fatigue.  He denies palpitations, PND, orthopnea, dizziness, syncope, edema, or early satiety. ? ?Home Medications  ?  ?Current Outpatient Medications  ?Medication Sig Dispense Refill  ? amLODipine (NORVASC) 2.5 MG tablet Take 1 tablet (2.5 mg total) by mouth daily. 90 tablet 1  ? aspirin 81 MG tablet Take 1 tablet (81 mg total) by mouth daily. 30 tablet   ? atorvastatin (LIPITOR) 80 MG tablet Take 1 tablet (80 mg total) by mouth daily. 90 tablet 3  ? carvedilol (COREG) 3.125 MG tablet Take 1 tablet (3.125 mg total) by mouth 2 (two) times daily with a meal. 180 tablet 1  ? clopidogrel (PLAVIX) 75 MG tablet TAKE 1 TABLET BY MOUTH ONCE A DAY. STOP BRILINTA 90 tablet 1  ? losartan (COZAAR) 100 MG tablet Take 1 tablet (100 mg total) by mouth daily. 90 tablet 1  ? meclizine  (ANTIVERT) 12.5 MG tablet Take 1 tablet (12.5 mg total) by mouth 3 (three) times daily as needed for dizziness. 30 tablet 0  ? nitroGLYCERIN (NITROSTAT) 0.4 MG SL tablet Place 1 tablet (0.4 mg total) under the tongue every 5 (five) minutes as needed for chest pain. 25 tablet 1  ? ?No current facility-administered medications for this visit.  ?  ? ?Review of Systems  ?  ?Notable for constipation, fatigue, poor sleep, and chronic dyspnea.  He denies chest pain, palpitations, PND, orthopnea, dizziness, syncope, edema, or early satiety.  All other systems reviewed and are otherwise negative except as noted above. ?  ? ?Physical Exam  ?  ?VS:  BP (!) 148/90 (BP Location: Left Arm, Patient Position: Sitting, Cuff Size: Normal)   Pulse (!) 57   Ht 5' 10.5" (1.791 m)   Wt 252 lb (114.3 kg)   SpO2 97%   BMI 35.65 kg/m?  , BMI Body mass index is 35.65 kg/m?. ?    ?Vitals:  ? 04/01/22 0911 04/01/22 1325  ?BP: (!) 148/90 (!) 150/90  ?Pulse: (!) 57   ?SpO2: 97%   ?  ?GEN: Well nourished, well developed, in  no acute distress. ?HEENT: normal. ?Neck: Supple, no JVD, carotid bruits, or masses. ?Cardiac: RRR, no murmurs, rubs, or gallops. No clubbing, cyanosis, edema.  Right radial catheterization site without bleeding, bruit, or hematoma.  Radials/PT 2+ and equal bilaterally.  ?Respiratory:  Respirations regular and unlabored, clear to auscultation bilaterally. ?GI: Soft, nontender, nondistended, BS + x 4. ?MS: no deformity or atrophy. ?Skin: warm and dry, no rash. ?Neuro:  Strength and sensation are intact. ?Psych: Normal affect. ? ?Accessory Clinical Findings  ?  ?ECG personally reviewed by me today - Sinus bradycardia, 57, baseline artifact - no acute changes. ? ?Lab Results  ?Component Value Date  ? WBC 7.4 02/27/2022  ? HGB 14.8 02/27/2022  ? HCT 44.8 02/27/2022  ? MCV 84.5 02/27/2022  ? PLT 234 02/27/2022  ?  ?Lab Results  ?Component Value Date  ? CREATININE 1.03 02/27/2022  ? BUN 16 02/27/2022  ? NA 140 02/27/2022  ? K  4.1 02/27/2022  ? CL 105 02/27/2022  ? CO2 26 02/27/2022  ? ?Lab Results  ?Component Value Date  ? ALT 15 08/27/2021  ? AST 14 08/27/2021  ? ALKPHOS 58 08/27/2021  ? BILITOT 1.0 08/27/2021  ? ?Lab Results  ?Component Value

## 2022-04-01 NOTE — Patient Instructions (Signed)
Medication Instructions:  ?Your physician has recommended you make the following change in your medication:  ? ?INCREASE Amlodipine to 5 mg once daily  ? ?*If you need a refill on your cardiac medications before your next appointment, please call your pharmacy* ? ? ?Lab Work: ?None ? ?If you have labs (blood work) drawn today and your tests are completely normal, you will receive your results only by: ?MyChart Message (if you have MyChart) OR ?A paper copy in the mail ?If you have any lab test that is abnormal or we need to change your treatment, we will call you to review the results. ? ? ?Testing/Procedures: ?None ? ? ?Follow-Up: ?At [facility], you and your health needs are our priority.  As part of our continuing mission to provide you with exceptional heart care, we have created designated Provider Care Teams.  These Care Teams include your primary Cardiologist (physician) and Advanced Practice Providers (APPs -  Physician Assistants and Nurse Practitioners) who all work together to provide you with the care you need, when you need it. ? ? ?Your next appointment:   ?2-3 month(s) ? ?The format for your next appointment:   ?In Person ? ?Provider:   ?[name], MD or Murray Hodgkins, NP  ? ? ? ? ? ?Important Information About Sugar ? ? ? ? ?  ?

## 2022-05-07 ENCOUNTER — Encounter: Payer: Self-pay | Admitting: Gastroenterology

## 2022-05-07 ENCOUNTER — Ambulatory Visit: Payer: Medicare PPO | Admitting: Gastroenterology

## 2022-05-07 VITALS — BP 148/86 | HR 53 | Ht 70.0 in | Wt 247.0 lb

## 2022-05-07 DIAGNOSIS — K5909 Other constipation: Secondary | ICD-10-CM

## 2022-05-07 DIAGNOSIS — R1010 Upper abdominal pain, unspecified: Secondary | ICD-10-CM

## 2022-05-07 DIAGNOSIS — Z86010 Personal history of colonic polyps: Secondary | ICD-10-CM

## 2022-05-07 NOTE — Patient Instructions (Signed)
You can start over the counter Miralax mixing 17 grams in 8 oz of water/juice daily.   Follow up with your primary care physician and Neuro surgeon.   The [place] [facility] providers would like to encourage you to use [facility] to communicate with providers for non-urgent requests or questions.  Due to long hold times on the telephone, sending your provider a message by [facility] may be a faster and more efficient way to get a response.  Please allow 48 business hours for a response.  Please remember that this is for non-urgent requests.   Thank you for choosing me and [facility].  Pricilla Riffle. Dagoberto Ligas., MD., [facility] Regal

## 2022-05-07 NOTE — Progress Notes (Signed)
Assessment     Musculoskeletal and/or radicular pain   Chronic constipation Personal history of adenomatous colon polyps   Recommendations    ES Tylenol or Tylenol arthritis as directed for pain for 1-2 weeks and assess response Follow-up with PCP and/or neurosurgeon for further evaluation and management Increase daily water and fiber intake.   Begin MiraLAX 1-2 capfuls daily titrated for complete bowel movement daily Surveillance colonoscopy recommended in May 2027   HPI    This is a 67 year old male complaining of costochondral, upper abdominal and back pain that occur with movement.  He is accompanied by his wife.  He states his symptoms are worse in the supine position and with movement.  His symptoms are more severe in the morning and over the course of the day his symptoms improve with daily activities.  His symptoms are unrelated to meals or bowel movements.  He has not tried Tylenol or ibuprofen to address his symptoms.  Evaluation for similar symptoms last year with CT AP as below.  He has chronic constipation and takes Ex-Lax intermittently.  Improvement or worsening in his constipation does not change his other complaints.   Labs / Imaging       Latest Ref Rng & Units 08/27/2021    8:54 AM 04/17/2021    9:39 AM 05/31/2020    4:50 PM  Hepatic Function  Total Protein 6.0 - 8.3 g/dL 6.8   7.2   7.5    Albumin 3.5 - 5.2 g/dL 4.2   4.2   4.6    AST 0 - 37 U/L 14   15   19     ALT 0 - 53 U/L 15   20   22     Alk Phosphatase 39 - 117 U/L 58   54   68    Total Bilirubin 0.2 - 1.2 mg/dL 1.0   0.7   0.9         Latest Ref Rng & Units 02/27/2022    9:30 AM 08/27/2021    8:54 AM 04/17/2021    9:39 AM  CBC  WBC 4.0 - 10.5 K/uL 7.4   6.3   6.5    Hemoglobin 13.0 - 17.0 g/dL 14.8   14.8   15.0    Hematocrit 39.0 - 52.0 % 44.8   44.8   43.3    Platelets 150 - 400 K/uL 234   180.0   184.0      CT AP 04/25/2021  1. No definite non-contrast CT findings of the abdomen or pelvis  to explain right upper quadrant or flank pain. 2. No urinary tract calculus or hydronephrosis. 3. Normal appendix. 4. There is mild thickening and fat stranding of the bladder wall, perhaps related to chronic outlet obstruction. Correlate for urinalysis evidence of infectious or inflammatory cystitis. 5. Status post cholecystectomy. 6. Sigmoid diverticulosis without evidence of acute diverticulitis. 7. Aortic Atherosclerosis   Current Medications, Allergies, Past Medical History, Past Surgical History, Family History and Social History were reviewed in [employer] record.   Physical Exam: General: Well developed, well nourished, no acute distress Head: Normocephalic and atraumatic Eyes: Sclerae anicteric, EOMI Ears: Normal auditory acuity Mouth: Not examined Lungs: Clear throughout to auscultation Heart: Regular rate and rhythm; no murmurs, rubs or bruits Abdomen: Soft, non tender and non distended. No masses, hepatosplenomegaly or hernias noted. Normal Bowel sounds Rectal: Not done Musculoskeletal: Symmetrical with no gross deformities, patient in obvious pain when rising from supine  position on the exam table, pain resolved when achieving seated position Pulses:  Normal pulses noted Extremities: No clubbing, cyanosis, edema or deformities noted Neurological: Alert oriented x 4, grossly nonfocal Psychological:  Alert and cooperative. Normal mood and affect   Teasha Murrillo T. Fuller Plan, MD 05/07/2022, 9:47 AM

## 2022-05-20 DIAGNOSIS — M5414 Radiculopathy, thoracic region: Secondary | ICD-10-CM | POA: Diagnosis not present

## 2022-05-20 DIAGNOSIS — M545 Low back pain, unspecified: Secondary | ICD-10-CM | POA: Diagnosis not present

## 2022-05-20 DIAGNOSIS — G8929 Other chronic pain: Secondary | ICD-10-CM | POA: Diagnosis not present

## 2022-05-20 DIAGNOSIS — M4714 Other spondylosis with myelopathy, thoracic region: Secondary | ICD-10-CM | POA: Diagnosis not present

## 2022-05-21 ENCOUNTER — Other Ambulatory Visit: Payer: Self-pay | Admitting: Neurosurgery

## 2022-05-21 DIAGNOSIS — M4714 Other spondylosis with myelopathy, thoracic region: Secondary | ICD-10-CM

## 2022-05-21 DIAGNOSIS — G8929 Other chronic pain: Secondary | ICD-10-CM

## 2022-05-21 DIAGNOSIS — M5414 Radiculopathy, thoracic region: Secondary | ICD-10-CM

## 2022-06-01 ENCOUNTER — Ambulatory Visit
Admission: RE | Admit: 2022-06-01 | Discharge: 2022-06-01 | Disposition: A | Discharge status: Home / Self Care | Payer: Medicare PPO | Source: Ambulatory Visit | Attending: Neurosurgery | Admitting: Neurosurgery

## 2022-06-01 DIAGNOSIS — M545 Low back pain, unspecified: Secondary | ICD-10-CM

## 2022-06-01 DIAGNOSIS — G8929 Other chronic pain: Secondary | ICD-10-CM

## 2022-06-01 DIAGNOSIS — M48061 Spinal stenosis, lumbar region without neurogenic claudication: Secondary | ICD-10-CM | POA: Diagnosis not present

## 2022-06-01 DIAGNOSIS — M47816 Spondylosis without myelopathy or radiculopathy, lumbar region: Secondary | ICD-10-CM | POA: Diagnosis not present

## 2022-06-01 DIAGNOSIS — M5126 Other intervertebral disc displacement, lumbar region: Secondary | ICD-10-CM | POA: Diagnosis not present

## 2022-06-01 DIAGNOSIS — M4714 Other spondylosis with myelopathy, thoracic region: Secondary | ICD-10-CM | POA: Insufficient documentation

## 2022-06-01 DIAGNOSIS — M5414 Radiculopathy, thoracic region: Secondary | ICD-10-CM | POA: Insufficient documentation

## 2022-06-01 DIAGNOSIS — M5134 Other intervertebral disc degeneration, thoracic region: Secondary | ICD-10-CM | POA: Diagnosis not present

## 2022-06-12 ENCOUNTER — Ambulatory Visit (INDEPENDENT_AMBULATORY_CARE_PROVIDER_SITE_OTHER): Payer: Medicare PPO | Admitting: Neurosurgery

## 2022-06-12 DIAGNOSIS — G8929 Other chronic pain: Secondary | ICD-10-CM

## 2022-06-12 DIAGNOSIS — M545 Low back pain, unspecified: Secondary | ICD-10-CM

## 2022-06-12 NOTE — Progress Notes (Signed)
Neurosurgery Telephone (Audio-Only) Note  Requesting Provider     Venia Carbon, MD [address] [place],  [place] [address] T: [phone] F: [phone]  Primary Care Provider Venia Carbon, MD [place] [address] T: [phone] F: [phone]  Telehealth visit was conducted with Matthew Doyle, a 66 y.o. male via telephone.   History of Present Illness: Matthew Doyle is a 66 y.o presenting today via telephone visit to review his lumbar and thoracic MRIs.  He denies any changes to his symptoms.  LOV: 05/20/2022 Matthew Doyle is a 66 y.o with a history of CAD s/p left heart cath on 02/28/22, chronic constipation, ? Parkinson's, A-fib, HTN, HLD who is here today with a chief complaint of low back and right leg symptoms. He states this started about a year ago without any particular inciting event. He has had progressive symptoms that are now constant and describes mid and low back pain with radiation around his upper abdomen is worse with activity as well as constant numbness in his right anterior thigh with associated tingling. He initially sought care by a GI doctor as he was concern for some abdominal problem given his abdominal symptoms however work-up was negative and he was told that his symptoms were likely related to spine. He denies any symptoms that radiate down past his right knee or any left-sided symptoms. This is worsened over the last 3 or 4 months. He also endorses increase in gait instability and falls. Symptoms seems to be exacerbated by laying down at night but do seem to improve as the day goes on. He is currently taking Tylenol which does provide some relief.  Conservative measures:  Physical therapy: none  Multimodal medical therapy including regular antiinflammatories: Tylenol which does provide some relief  Injections: no epidural steroid injections  Past Surgery: Previous C4-7 ACDF with Dr. Izora Ribas in  2018  Matthew Doyle has no symptoms of cervical myelopathy.  The symptoms are causing a significant impact on the patient's life.    General Review of Systems:  A ROS was performed including pertinent positive and negatives as documented.  All other systems are negative.  Prior to Admission medications   Medication Sig Start Date End Date Taking? Authorizing Provider  amLODipine (NORVASC) 5 MG tablet Take 1 tablet (5 mg total) by mouth daily. 04/01/22 06/30/22  Theora Gianotti, NP  aspirin 81 MG tablet Take 1 tablet (81 mg total) by mouth daily. 12/18/12   Barrett, Evelene Croon, PA-C  atorvastatin (LIPITOR) 80 MG tablet Take 1 tablet (80 mg total) by mouth daily. 02/27/22   Wellington Hampshire, MD  carvedilol (COREG) 3.125 MG tablet Take 1 tablet (3.125 mg total) by mouth 2 (two) times daily with a meal. 02/27/22   Wellington Hampshire, MD  clopidogrel (PLAVIX) 75 MG tablet TAKE 1 TABLET BY MOUTH ONCE A DAY. STOP BRILINTA 02/27/22   Wellington Hampshire, MD  losartan (COZAAR) 100 MG tablet Take 1 tablet (100 mg total) by mouth daily. 02/27/22   Wellington Hampshire, MD  meclizine (ANTIVERT) 12.5 MG tablet Take 1 tablet (12.5 mg total) by mouth 3 (three) times daily as needed for dizziness. 08/24/20   Sharion Balloon, NP  nitroGLYCERIN (NITROSTAT) 0.4 MG SL tablet Place 1 tablet (0.4 mg total) under the tongue every 5 (five) minutes as needed for chest pain. 02/27/22   Wellington Hampshire, MD    DATA REVIEWED    Imaging  Studies  MRI T spine  IMPRESSION: Mild degenerative changes of the thoracic spine without high-grade stenosis.   Mild degenerative changes of the lumbar spine as detailed above. No high-grade stenosis. Facet arthropathy is greatest at L4-L5.     Electronically Signed   By: Macy Mis M.D.   On: 06/02/2022 14:51  MRI L spine IMPRESSION: Mild degenerative changes of the thoracic spine without high-grade stenosis.   Mild degenerative changes of the lumbar spine as detailed  above. No high-grade stenosis. Facet arthropathy is greatest at L4-L5.     Electronically Signed   By: Macy Mis M.D.   On: 06/02/2022 14:51   Laboratory Studies  No results found for this or any previous visit (from the past 2160 hour(s)).   IMPRESSION  Matthew Doyle is a 66 y.o. male who I performed a telephone encounter today for evaluation and management of: thoracic and lumbar pain   PLAN  I reviewed the lumbar and thoracic MRI findings with Matthew Doyle.  He does have some facet arthropathy particularly at L4-5.  We discussed considering injections for this however he says the low back pain is the mild component of his symptoms.  Unfortunately do not have an explanation for his lower thoracic pain and anterior thigh numbness.  I offered to refer him back to Dr. Brigitte Pulse as this is who he was seeing for his Parkinson's disease however he would like to talk to his primary care provider.  I will see him going forward on an as-needed basis.  He expressed understanding and was in agreement with this plan.  No orders of the defined types were placed in this encounter.   DISPOSITION  Follow up: In person appointment in  PRN  Loleta Dicker, PA Neurosurgery  TELEPHONE DOCUMENTATION   I spent a total of 5 minutes in both face-to-face and non-face-to-face activities for this visit on the date of this encounter.

## 2022-06-19 ENCOUNTER — Ambulatory Visit: Payer: Medicare PPO | Admitting: Nurse Practitioner

## 2022-07-29 ENCOUNTER — Encounter: Payer: Self-pay | Admitting: Nurse Practitioner

## 2022-07-29 ENCOUNTER — Ambulatory Visit: Payer: Medicare PPO | Attending: Nurse Practitioner | Admitting: Nurse Practitioner

## 2022-07-29 VITALS — BP 158/94 | HR 56 | Ht 71.0 in | Wt 250.6 lb

## 2022-07-29 DIAGNOSIS — I10 Essential (primary) hypertension: Secondary | ICD-10-CM | POA: Diagnosis not present

## 2022-07-29 DIAGNOSIS — E785 Hyperlipidemia, unspecified: Secondary | ICD-10-CM | POA: Diagnosis not present

## 2022-07-29 DIAGNOSIS — I2510 Atherosclerotic heart disease of native coronary artery without angina pectoris: Secondary | ICD-10-CM | POA: Diagnosis not present

## 2022-07-29 MED ORDER — AMLODIPINE BESYLATE 10 MG PO TABS: 10.0000 mg | ORAL_TABLET | Freq: Every day | ORAL | 1 refills | Status: DC

## 2022-07-29 NOTE — Patient Instructions (Addendum)
Medication Instructions:   START TAKING AMLODIPINE 10 MG ONCE A DAY   *If you need a refill on your cardiac medications before your next appointment, please call your pharmacy*   Lab Work: NONE ORDERED  TODAY    If you have labs (blood work) drawn today and your tests are completely normal, you will receive your results only by: [place] (if you have MyChart) OR A paper copy in the mail If you have any lab test that is abnormal or we need to change your treatment, we will call you to review the results.   Testing/Procedures: NONE ORDERED  TODAY    Follow-Up: At [facility], you and your health needs are our priority.  As part of our continuing mission to provide you with exceptional heart care, we have created designated Provider Care Teams.  These Care Teams include your primary Cardiologist (physician) and Advanced Practice Providers (APPs -  Physician Assistants and Nurse Practitioners) who all work together to provide you with the care you need, when you need it.  We recommend signing up for the patient portal called "MyChart".  Sign up information is provided on this After Visit Summary.  MyChart is used to connect with patients for Virtual Visits (Telemedicine).  Patients are able to view lab/test results, encounter notes, upcoming appointments, etc.  Non-urgent messages can be sent to your provider as well.   To learn more about what you can do with MyChart, go to [email or web address].    Your next appointment:   3 month(s)  The format for your next appointment:   In Person  Provider:   You may see [name], MD    Other Instructions   Important Information About Sugar

## 2022-07-29 NOTE — Progress Notes (Signed)
Office Visit    Patient Name: Matthew Doyle Date of Encounter: 07/29/2022  Primary Care Provider:  Venia Carbon, Doyle Primary Cardiologist:  Matthew Sacramento, Doyle  Chief Complaint    66 year old male with a history of CAD status post prior LAD and OM2 stenting, hypertension, hyperlipidemia, sleep apnea, and cervical myelopathy, who presents for follow-up for CAD.  Past Medical History    Past Medical History:  Diagnosis Date   Arthritis    knee   Atrial fibrillation ([place]) post MI    CAD (coronary artery disease)    a. 09/2003 Cath/PCI: LAD 80% (3.0x23 Cypher DES);  b. NSTEMI 12/2012 cath patent LAD stent, included distal right PDA supplying a small territory, 99% proximal OM 2 (DES); c. 06/20/2016 [place] STEMI/PCI: Stent to LAD, PTCA D1 & LCX; d. 01/2022 Cath: LM mild dzs, LAD 10p/m ISR, D1 80, LCX 66m OM1 patent, RCA 20p, RPDA 99 w/ R->R collats from AM. EF 55-65%-->Med Rx.   Colon polyps    Diverticulosis    Gout    Hemorrhoids    Hyperlipidemia    Hypertension    Internal hemorrhoids without mention of complication    Myocardial infarction ([facility]    Sleep apnea    ST elevation (STEMI) myocardial infarction involving left anterior descending coronary artery ([facility] 06/20/2016   Past Surgical History:  Procedure Laterality Date   BACK SURGERY  10/20/2016   Cervical fusion--Dr.Chester Doyle  2014   s/p stent   CARDIAC CATHETERIZATION N/A 06/20/2016   Procedure: Left Heart Cath and Coronary Angiography;  Surgeon: Matthew Doyle;  Location: [facility];  Service: Cardiovascular;  Laterality: N/A;   CARDIAC CATHETERIZATION N/A 06/20/2016   Procedure: Coronary Stent Intervention;  Surgeon: [facility];  [facility];  Service: Cardiovascular;  Laterality: N/A;   CHOLECYSTECTOMY N/A 03/13/2016   Procedure: LAPAROSCOPIC CHOLECYSTECTOMY WITH INTRAOPERATIVE CHOLANGIOGRAM;  Surgeon: SChristene Lye Doyle;  Location:  Matthew Doyle;  Service: General;  Laterality: N/A;   CHOLECYSTECTOMY, LAPAROSCOPIC  03/13/16   Infection - lengthy diagnosis d/t lack of gallstones   COLONOSCOPY  10/2013   CORONARY ANGIOPLASTY     STENTS   Cypher Stent  10/04   mid LAD- gupta   LEFT HEART CATH AND CORONARY ANGIOGRAPHY Left 02/28/2022   Procedure: LEFT HEART CATH AND CORONARY ANGIOGRAPHY;  Surgeon: AWellington Hampshire Doyle;  [facility];  Service: Cardiovascular;  Laterality: Left;   LEFT HEART CATHETERIZATION WITH CORONARY ANGIOGRAM N/A 12/17/2012   Procedure: LEFT HEART CATHETERIZATION WITH CORONARY ANGIOGRAM;  Surgeon: Matthew [facility];  [facility];  Service: Cardiovascular;  Laterality: N/A;   PERCUTANEOUS CORONARY STENT INTERVENTION (PCI-S) Right 12/17/2012   Procedure: PERCUTANEOUS CORONARY STENT INTERVENTION (PCI-S);  Surgeon: Matthew Doyle;  [facility];  Service: Cardiovascular;  Laterality: Right;    Allergies  Allergies  Allergen Reactions   Lisinopril Cough    REACTION: cough with this and/or benazepril    History of Present Illness    66year old male with above past medical history including CAD, hypertension, hyperlipidemia, sleep apnea, and cervical myelopathy.  He previously underwent drug-eluting stent placement to the LAD in 2004 with non-STEMI in January 2014, requiring drug-eluting stent placement to the second obtuse marginal.  In July 2017, he presented with anterolateral STEMI complicated by VF arrest.  Catheterization showed a subtotal thrombotic occlusion of the proximal LAD at the bifurcation of the  diagonal, as well as thrombus in the mid left circumflex.  He underwent aspiration thrombectomy of the diagonal and left circumflex and drug-eluting stent placement to the proximal LAD.  EF was 45% at that time however, subsequent echo showed normal LV systolic function.  In March 2023, he was seen in clinic with reports of exertional substernal chest pain,  tightness, dizziness, and fatigue.  He was noted to be bradycardic and his beta-blocker dose was reduced.  Decision was made to perform diagnostic catheterization which showed patent LAD stents with an 80% diagonal stenosis, nonobstructive circumflex disease, and a subtotal occlusion of the RPDA with right to right collaterals from an acute marginal.  Anatomy was overall stable and recommendation was made for aggressive medical therapy.  Matthew Doyle was last seen in cardiology clinic on Apr 01, 2022, at which time he reported stable dyspnea on exertion and fatigue without worsening of chest pain.  Amlodipine therapy was titrated to 5 mg daily in the setting of hypertension.  He also noted daytime somnolence and fatigue in the setting of known but untreated sleep apnea (did not tolerate CPAP).  He declined repeat referral to pulmonology.  Since then, he has continued to experience a band-like abd discomfort and soreness/tenderness.  This was at least loosely associated with constipation and he saw GI and was placed on MiraLAX.  There has been some suspicion that symptoms might be musculoskeletal in the setting of back and abdominal wall tenderness, and symptoms did seem to improve with Tylenol.  Symptoms are most noticeable first thing in the morning, when he is getting out of bed and within a few hours, they have resolved completely.  In the setting of his back and abdominal symptoms as well as right lower extremity anterior thigh numbness he also had an MRI and was seen by neurosurgery with MRI findings that were felt to be relatively stable..  From a cardiac standpoint, he has not been experiencing any chest pain.  He notes reasonable activity tolerance without significant dyspnea.  He denies palpitations, PND, orthopnea, dizziness, syncope, edema, or early satiety.  His blood pressure is elevated today.  He does not routinely check his pressure at home.  Home Medications    Current Outpatient Medications   Medication Sig Dispense Refill   aspirin 81 MG tablet Take 1 tablet (81 mg total) by mouth daily. 30 tablet    atorvastatin (LIPITOR) 80 MG tablet Take 1 tablet (80 mg total) by mouth daily. 90 tablet 3   carvedilol (COREG) 3.125 MG tablet Take 1 tablet (3.125 mg total) by mouth 2 (two) times daily with a meal. 180 tablet 1   clopidogrel (PLAVIX) 75 MG tablet TAKE 1 TABLET BY MOUTH ONCE A DAY. STOP BRILINTA 90 tablet 1   losartan (COZAAR) 100 MG tablet Take 1 tablet (100 mg total) by mouth daily. 90 tablet 1   meclizine (ANTIVERT) 12.5 MG tablet Take 1 tablet (12.5 mg total) by mouth 3 (three) times daily as needed for dizziness. 30 tablet 0   nitroGLYCERIN (NITROSTAT) 0.4 MG SL tablet Place 1 tablet (0.4 mg total) under the tongue every 5 (five) minutes as needed for chest pain. 25 tablet 1   amLODipine (NORVASC) 5 MG tablet Take 1 tablet (5 mg total) by mouth daily. 90 tablet 3   No current facility-administered medications for this visit.     Review of Systems    Ongoing abdominal/abdominal wall tenderness, usually first thing in the morning that resolves after a few hours.  He also has some mid back pain and right anterior thigh numbness.  He denies chest pain, dyspnea, palpitations, PND, orthopnea, dizziness, syncope, edema, or early satiety.  All other systems reviewed and are otherwise negative except as noted above.    Physical Exam    VS:  BP (!) 144/88 (BP Location: Left Arm, Patient Position: Sitting, Cuff Size: Normal)   Pulse (!) 56   Ht '5\' 11"'$  (1.803 m)   Wt 250 lb 9.6 oz (113.7 kg)   SpO2 97%   BMI 34.95 kg/m  , BMI Body mass index is 34.95 kg/m.     Vitals:   07/29/22 0900 07/29/22 0933  BP: (!) 144/88 (!) 158/94  Pulse: (!) 56   SpO2: 97%     GEN: Well nourished, well developed, in no acute distress. HEENT: normal. Neck: Supple, no JVD, carotid bruits, or masses. Cardiac: RRR, no murmurs, rubs, or gallops. No clubbing, cyanosis, edema.  Radials/PT 2+ and equal  bilaterally.  Respiratory:  Respirations regular and unlabored, clear to auscultation bilaterally. GI: Soft, nontender, nondistended, BS + x 4. MS: no deformity or atrophy. Skin: warm and dry, no rash. Neuro:  Strength and sensation are intact. Psych: Normal affect.  Accessory Clinical Findings    ECG personally reviewed by me today -sinus bradycardia, 56, septal infarct- no acute changes.  [facility] Results  Component Value Date   WBC 7.4 02/27/2022   HGB 14.8 02/27/2022   HCT 44.8 02/27/2022   MCV 84.5 02/27/2022   PLT 234 02/27/2022   [facility] Results  Component Value Date   CREATININE 1.03 02/27/2022   BUN 16 02/27/2022   NA 140 02/27/2022   K 4.1 02/27/2022   CL 105 02/27/2022   CO2 26 02/27/2022   [facility] Results  Component Value Date   ALT 15 08/27/2021   AST 14 08/27/2021   ALKPHOS 58 08/27/2021   BILITOT 1.0 08/27/2021   [facility] Results  Component Value Date   CHOL 93 08/27/2021   HDL 33.20 (L) 08/27/2021   LDLCALC 46 08/27/2021   TRIG 69.0 08/27/2021   CHOLHDL 3 08/27/2021    [facility] Results  Component Value Date   HGBA1C 6.3 (H) 06/20/2016    Assessment & Plan    1.  Coronary artery disease: Status post diagnostic catheterization in March 2023 in the setting of progressive angina, which showed relatively stable anatomy with subtotal occlusion of the RPDA with right to right collaterals.  He has been medically managed with aspirin, statin, beta-blocker, Plavix, and calcium channel blocker therapy and has not been having any chest pain or significant dyspnea.  Blood pressure elevated today and I will titrate his amlodipine to 10 mg daily.  Otherwise continue current regimen.  2.  Essential hypertension: Blood pressure elevated today at 144/88 with repeat of 158/94.  He reports good compliance with his medications and did take his medicines this morning.  I will increase his amlodipine to 10 mg once a day.  He does have a cuff at home and I have encouraged him to check his  pressure at least once a day and record.  3.  Hyperlipidemia: LDL of 46 in September 2022 with normal LFTs at that time.  He remains on high potency statin therapy.  4.  Obstructive sleep apnea: Likely contributing to prior history of fatigue and daytime somnolence as he did not previously tolerate CPAP.  I previously offered referral back to pulmonology for repeat study and to discuss options however, he declined.  5.  Abdominal wall discomfort: Predominantly noted first thing in the morning when getting out of bed and seems to improve after a few hours, or when taking Tylenol.  He has seen both GI and neurosurgery without clear explanation of symptoms.  Based on his description, there does seem to be a musculoskeletal component.  6.  Disposition: Follow-up in clinic in 3 months or sooner if necessary.   Murray Hodgkins, NP 07/29/2022, 9:11 AM

## 2022-08-29 ENCOUNTER — Ambulatory Visit (INDEPENDENT_AMBULATORY_CARE_PROVIDER_SITE_OTHER): Payer: Medicare PPO | Admitting: Internal Medicine

## 2022-08-29 ENCOUNTER — Encounter: Payer: Self-pay | Admitting: Internal Medicine

## 2022-08-29 VITALS — BP 134/86 | HR 58 | Temp 97.4°F | Ht 70.5 in | Wt 248.0 lb

## 2022-08-29 DIAGNOSIS — M1000 Idiopathic gout, unspecified site: Secondary | ICD-10-CM | POA: Diagnosis not present

## 2022-08-29 DIAGNOSIS — G214 Vascular parkinsonism: Secondary | ICD-10-CM | POA: Diagnosis not present

## 2022-08-29 DIAGNOSIS — I2510 Atherosclerotic heart disease of native coronary artery without angina pectoris: Secondary | ICD-10-CM | POA: Diagnosis not present

## 2022-08-29 DIAGNOSIS — Z125 Encounter for screening for malignant neoplasm of prostate: Secondary | ICD-10-CM

## 2022-08-29 DIAGNOSIS — I10 Essential (primary) hypertension: Secondary | ICD-10-CM

## 2022-08-29 DIAGNOSIS — Z23 Encounter for immunization: Secondary | ICD-10-CM | POA: Diagnosis not present

## 2022-08-29 DIAGNOSIS — Z0000 Encounter for general adult medical examination without abnormal findings: Secondary | ICD-10-CM | POA: Diagnosis not present

## 2022-08-29 LAB — COMPREHENSIVE METABOLIC PANEL
ALT: 17 U/L (ref 0–53)
AST: 16 U/L (ref 0–37)
Albumin: 4.2 g/dL (ref 3.5–5.2)
Alkaline Phosphatase: 58 U/L (ref 39–117)
BUN: 18 mg/dL (ref 6–23)
CO2: 29 mEq/L (ref 19–32)
Calcium: 9.2 mg/dL (ref 8.4–10.5)
Chloride: 104 mEq/L (ref 96–112)
Creatinine, Ser: 1.18 mg/dL (ref 0.40–1.50)
GFR: 64.1 mL/min (ref >60.00)
Glucose, Bld: 123 mg/dL — ABNORMAL HIGH (ref 70–99)
Potassium: 4 mEq/L (ref 3.5–5.1)
Sodium: 141 mEq/L (ref 135–145)
Total Bilirubin: 0.9 mg/dL (ref 0.2–1.2)
Total Protein: 7 g/dL (ref 6.0–8.3)

## 2022-08-29 LAB — LIPID PANEL
Cholesterol: 111 mg/dL (ref 0–200)
HDL: 28.9 mg/dL — ABNORMAL LOW (ref >39.00)
LDL Cholesterol: 55 mg/dL (ref 0–99)
NonHDL: 82.55
Total CHOL/HDL Ratio: 4
Triglycerides: 136 mg/dL (ref 0.0–149.0)
VLDL: 27.2 mg/dL (ref 0.0–40.0)

## 2022-08-29 LAB — CBC
HCT: 43.1 % (ref 39.0–52.0)
Hemoglobin: 14.5 g/dL (ref 13.0–17.0)
MCHC: 33.7 g/dL (ref 30.0–36.0)
MCV: 85.3 fl (ref 78.0–100.0)
Platelets: 172 10*3/uL (ref 150.0–400.0)
RBC: 5.05 Mil/uL (ref 4.22–5.81)
RDW: 13.9 % (ref 11.5–15.5)
WBC: 6.5 10*3/uL (ref 4.0–10.5)

## 2022-08-29 LAB — PSA, MEDICARE: PSA: 1.45 ng/ml (ref 0.10–4.00)

## 2022-08-29 NOTE — Assessment & Plan Note (Signed)
No recent symptoms

## 2022-08-29 NOTE — Addendum Note (Signed)
Addended by: Pilar Grammes on: 08/29/2022 09:58 AM   Modules accepted: Orders

## 2022-08-29 NOTE — Assessment & Plan Note (Addendum)
Recent cath showed stable disease On ASA 81, plavix 75, atorvastatin 80, losartan 100, carvedilol 3.125 bid Hasn't needed nitro

## 2022-08-29 NOTE — Assessment & Plan Note (Signed)
Likely vascular  Mild--no Rx indicated

## 2022-08-29 NOTE — Progress Notes (Signed)
Vision Screening   Right eye Left eye Both eyes  Without correction     With correction '20/20 20/25 20/20 '$  Hearing Screening - Comments:: Passed whisper test

## 2022-08-29 NOTE — Assessment & Plan Note (Signed)
I have personally reviewed the Medicare Annual Wellness questionnaire and have noted 1. The patient's medical and social history 2. Their use of alcohol, tobacco or illicit drugs 3. Their current medications and supplements 4. The patient's functional ability including ADL's, fall risks, home safety risks and hearing or visual             impairment. 5. Diet and physical activities 6. Evidence for depression or mood disorders  The patients weight, height, BMI and visual acuity have been recorded in the chart I have made referrals, counseling and provided education to the patient based review of the above and I have provided the pt with a written personalized care plan for preventive services.  I have provided you with a copy of your personalized plan for preventive services. Please take the time to review along with your updated medication list.  Colon due 2027 Discussed PSA --will check Try adding resistance exercise Prefers no COVID vaccine Flu vaccine today Consider shingrix at pharmacy

## 2022-08-29 NOTE — Progress Notes (Signed)
Subjective:    Patient ID: Matthew Doyle, male    DOB: 23-Dec-1955, 66 y.o.   MRN: [id number]  HPI Here for Medicare wellness visit and follow up of chronic health conditions Reviewed advanced directives Reviewed other doctors----Dr Stark--GI, Ms Koch--neurosurgery, Dr Jonna Clark, Dr Forestine Na  No hospitalizations or surgery Vision is okay Same decreased right ear hearing --and ringing. Not ready for aides No tobacco No alcohol No overt depression--just melancholy/frustrated at times Stays active--but no set exercise Independent with instrumental ADLs No memory problems of note  Still "fighting that stomach thing" Goes back 2 years Has band like pain around upper abdomen--upon awakening. Seems to improve as the day goes on. Doesn't limit him--but can be severe initially (seems to get better with moving around) No N/V---eats okay Went to neurosurgery to make sure it wasn't his back--MRIs negative  Did have cardiac cath again to be sure no problems Ongoing blockages but medical Rx  No chest pain No SOB--but not really doing set exercise (but "I stay active") No palpitations Some dizziness--but no syncope. Got meclizine with some help  Also notes numbness along lateral right thigh Discussed meralgia paresthetica  Same tremor Walks okay No change in handwriting Did start after last MI  Current Outpatient Medications on File Prior to Visit  Medication Sig Dispense Refill   amLODipine (NORVASC) 10 MG tablet Take 1 tablet (10 mg total) by mouth daily. 90 tablet 1   aspirin 81 MG tablet Take 1 tablet (81 mg total) by mouth daily. 30 tablet    atorvastatin (LIPITOR) 80 MG tablet Take 1 tablet (80 mg total) by mouth daily. 90 tablet 3   carvedilol (COREG) 3.125 MG tablet Take 1 tablet (3.125 mg total) by mouth 2 (two) times daily with a meal. 180 tablet 1   clopidogrel (PLAVIX) 75 MG tablet TAKE 1 TABLET BY MOUTH ONCE A DAY. STOP BRILINTA 90 tablet 1   losartan  (COZAAR) 100 MG tablet Take 1 tablet (100 mg total) by mouth daily. 90 tablet 1   meclizine (ANTIVERT) 12.5 MG tablet Take 1 tablet (12.5 mg total) by mouth 3 (three) times daily as needed for dizziness. 30 tablet 0   nitroGLYCERIN (NITROSTAT) 0.4 MG SL tablet Place 1 tablet (0.4 mg total) under the tongue every 5 (five) minutes as needed for chest pain. 25 tablet 1   No current facility-administered medications on file prior to visit.    Allergies  Allergen Reactions   Lisinopril Cough    REACTION: cough with this and/or benazepril    Past Medical History:  Diagnosis Date   Arthritis    knee   Atrial fibrillation (HCC) post MI    CAD (coronary artery disease)    a. 09/2003 Cath/PCI: LAD 80% (3.0x23 Cypher DES);  b. NSTEMI 12/2012 cath patent LAD stent, included distal right PDA supplying a small territory, 99% proximal OM 2 (DES); c. 06/20/2016 Anlat STEMI/PCI: Stent to LAD, PTCA D1 & LCX; d. 01/2022 Cath: LM mild dzs, LAD 10p/m ISR, D1 80, LCX 83m OM1 patent, RCA 20p, RPDA 99 w/ R->R collats from AM. EF 55-65%-->Med Rx.   Colon polyps    Diverticulosis    Gout    Hemorrhoids    Hyperlipidemia    Hypertension    Internal hemorrhoids without mention of complication    [facility]ocardial infarction ([facility]    Sleep apnea    ST elevation (STEMI) myocardial infarction involving left anterior descending coronary artery ([place] 06/20/2016    Past Surgical History:  Procedure Laterality Date   BACK SURGERY  10/20/2016   Cervical fusion--Dr.Chester [place] CATHETERIZATION  2014   s/p stent   CARDIAC CATHETERIZATION N/A 06/20/2016   Procedure: Left Heart Cath and Coronary Angiography;  Surgeon: Sherren Mocha, MD;  Location: [place] CV LAB;  Service: Cardiovascular;  Laterality: N/A;   CARDIAC CATHETERIZATION N/A 06/20/2016   Procedure: Coronary Stent Intervention;  Surgeon: Sherren Mocha, MD;  Location: [place] CV LAB;  Service: Cardiovascular;  Laterality: N/A;    CHOLECYSTECTOMY N/A 03/13/2016   Procedure: LAPAROSCOPIC CHOLECYSTECTOMY WITH INTRAOPERATIVE CHOLANGIOGRAM;  Surgeon: Christene Lye, MD;  Location: [facility] ORS;  Service: General;  Laterality: N/A;   CHOLECYSTECTOMY, LAPAROSCOPIC  03/13/16   Infection - lengthy diagnosis d/t lack of gallstones   COLONOSCOPY  10/2013   CORONARY ANGIOPLASTY     STENTS   Cypher Stent  10/04   mid LAD- gupta   LEFT HEART CATH AND CORONARY ANGIOGRAPHY Left 02/28/2022   Procedure: LEFT HEART CATH AND CORONARY ANGIOGRAPHY;  Surgeon: Wellington Hampshire, MD;  Location: [place] CV LAB;  Service: Cardiovascular;  Laterality: Left;   LEFT HEART CATHETERIZATION WITH CORONARY ANGIOGRAM N/A 12/17/2012   Procedure: LEFT HEART CATHETERIZATION WITH CORONARY ANGIOGRAM;  Surgeon: Peter M Martinique, MD;  Location: [facility] CATH LAB;  Service: Cardiovascular;  Laterality: N/A;   PERCUTANEOUS CORONARY STENT INTERVENTION (PCI-S) Right 12/17/2012   Procedure: PERCUTANEOUS CORONARY STENT INTERVENTION (PCI-S);  Surgeon: Peter M Martinique, MD;  Location: [facility] CATH LAB;  Service: Cardiovascular;  Laterality: Right;    Family History  Problem Relation Age of Onset   Diabetes Father    Coronary artery disease Father        CABG - 5 vessel 17 yr ago   Heart attack Paternal Uncle        Age 74   Heart attack Maternal Uncle        Age 46   Coronary artery disease Maternal Grandfather    Heart attack Maternal Grandfather        Died in 2s   Throat cancer Maternal Grandfather    Hypertension Mother    Alzheimer's disease Maternal Grandmother    Hypertension Brother    Colon cancer Neg Hx    Esophageal cancer Neg Hx    Rectal cancer Neg Hx    Stomach cancer Neg Hx     Social History   Socioeconomic History   Marital status: Married    Spouse name: Not on file   Number of children: 2   Years of education: Not on file   Highest education level: Not on file  Occupational History   Occupation: Writer    Comment: part time    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  Tobacco Use   Smoking status: Never    Passive exposure: Past   Smokeless tobacco: Never  Scientific laboratory technician Use: Never used  Substance and Sexual Activity   Alcohol use: No    Alcohol/week: 0.0 standard drinks of alcohol   Drug use: No   Sexual activity: Yes    Partners: Female  Other Topics Concern   Not on file  Social History Narrative   Retired as Social research officer, government for [employer]      No living will   Wife would be health care POA---alternate is her sons   Would accept resuscitation attempts   Not sure about tube feeds  Social Determinants of Health   Financial Resource Strain: Not on file  Food Insecurity: Not on file  Transportation Needs: Not on file  Physical Activity: Not on file  Stress: Not on file  Social Connections: Not on file  Intimate Partner Violence: Not on file   Review of Systems Appetite is okay Weight stable Still works 2 days per week generally Sleeps okay in general Urinary stream okay---nocturia x 1 Wears seat belt Teeth in bad shape--needs work done. No regular dentist--plans to get dentures No heartburn or dysphagia Bowels are slow at times--drinks apple juice/ex-lax No sig back or joint pain---just "normal stuff" No suspicious skin lesions Occasional left great toe pain---?gout (hasn't needed meds)    Objective:   Physical Exam Constitutional:      Appearance: Normal appearance.  HENT:     Mouth/Throat:     Comments: No lesions Eyes:     Conjunctiva/sclera: Conjunctivae normal.     Pupils: Pupils are equal, round, and reactive to light.  Cardiovascular:     Rate and Rhythm: Normal rate and regular rhythm.     Pulses: Normal pulses.     Heart sounds: No murmur heard.    No gallop.  Pulmonary:     Effort: Pulmonary effort is normal.     Breath sounds: Normal breath sounds. No wheezing or rales.  Abdominal:     Palpations:  Abdomen is soft.     Tenderness: There is no abdominal tenderness.  Musculoskeletal:     Cervical back: Neck supple.     Right lower leg: No edema.     Left lower leg: No edema.  Lymphadenopathy:     Cervical: No cervical adenopathy.  Skin:    Findings: No lesion or rash.  Neurological:     General: No focal deficit present.     Mental Status: He is alert and oriented to person, place, and time.     Comments: Mini-cog normal  Psychiatric:        Mood and Affect: Mood normal.        Behavior: Behavior normal.            Assessment & Plan:

## 2022-08-29 NOTE — Assessment & Plan Note (Signed)
BP Readings from Last 3 Encounters:  08/29/22 134/86  07/29/22 (!) 158/94  05/07/22 (!) 148/86   On heart meds plus amlodipine 10

## 2022-09-15 ENCOUNTER — Other Ambulatory Visit: Payer: Self-pay | Admitting: Cardiovascular Disease

## 2022-10-01 ENCOUNTER — Telehealth: Payer: Self-pay | Admitting: Cardiovascular Disease

## 2022-10-01 NOTE — Telephone Encounter (Signed)
   Patient Name: Matthew Doyle  DOB: 1956/11/29 MRN: [id number]  Primary Cardiologist: [name], MD  Chart reviewed as part of pre-operative protocol coverage.   IF SIMPLE EXTRACTION/CLEANINGS: Simple dental extractions (i.e. 1-2 teeth) are considered low risk procedures per guidelines and generally do not require any specific cardiac clearance. It is also generally accepted that for simple extractions and dental cleanings, there is no need to interrupt blood thinner therapy.   SBE prophylaxis is not required for the patient from a cardiac standpoint.  I will route this recommendation to the requesting party via Epic fax function and remove from pre-op pool.  Please call with questions.  Finis Bud, NP 10/01/2022, 3:46 PM

## 2022-10-01 NOTE — Telephone Encounter (Signed)
   Pre-operative Risk Assessment    Patient Name: Matthew Doyle  DOB: [birth year] MRN: [id number]     Request for Surgical Clearance    Procedure:  Dental Extraction - Amount of Teeth to be Pulled:  1  Date of Surgery:  Clearance 10/02/22                                 Surgeon:  Durene Cal DDS   Surgeon's Group or Practice Name:  [facility]  Phone number:  [phone] Fax number:  [phone]   Type of Clearance Requested:   - Medical  - Pharmacy:  Hold Clopidogrel (Plavix) (she states she is not sure, and they would like to be told what he needs to hold)    Type of Anesthesia:  Local    Additional requests/questions:    Dorthey Sawyer   10/01/2022, 3:36 PM

## 2022-10-01 NOTE — Telephone Encounter (Signed)
   Patient Name: Matthew Doyle  DOB: 20-Jan-1956 MRN: [id number]  Primary Cardiologist: [name], MD  Chart reviewed as part of pre-operative protocol coverage.   IF SIMPLE EXTRACTION/CLEANINGS: Simple dental extractions (i.e. 1-2 teeth) are considered low risk procedures per guidelines and generally do not require any specific cardiac clearance. It is also generally accepted that for simple extractions and dental cleanings, there is no need to interrupt blood thinner therapy.   SBE prophylaxis is not required for the patient from a cardiac standpoint.  I will route this recommendation to the requesting party via Epic fax function and remove from pre-op pool.  Please call with questions.  Elgie Collard, PA-C 10/01/2022, 4:01 PM

## 2022-10-01 NOTE — Telephone Encounter (Signed)
   Pre-operative Risk Assessment    Patient Name: Matthew Doyle  DOB: [birth year] MRN: [id number]     Request for Surgical Clearance    Procedure:  Extract 1 tooth  Date of Surgery:  Clearance TBD                                 Surgeon:  Sander Nephew Surgeon's Group or Practice Name:  [facility] Phone number:  [phone] Fax number:  [phone]   Type of Clearance Requested:   Pharmacy, Plavix   Type of Anesthesia:  Local    Additional requests/questions:    Signed, Maxwell Caul   10/01/2022, 3:50 PM

## 2022-10-02 ENCOUNTER — Telehealth: Payer: Self-pay | Admitting: Cardiovascular Disease

## 2022-10-02 NOTE — Telephone Encounter (Signed)
   Pre-operative Risk Assessment    Patient Name: Matthew Doyle  DOB: 1956/11/20 MRN: [id number]{     Request for Surgical Clearance{   Procedure:  Dental Extraction - Amount of Teeth to be Pulled:  1  Date of Surgery:  Clearance TBD                                Surgeon:  DR Durene Cal Surgeon's Group or Practice Name:  [facility] Phone number:  [phone] Fax number:  [phone]  Type of Clearance Requested:   PHARMACY-PLAVIX   Type of Anesthesia:  Local    Additional requests/questions:    SignedEli Phillips   10/02/2022, 12:24 PM

## 2022-10-02 NOTE — Telephone Encounter (Signed)
   Patient Name: Matthew Doyle  DOB: 07-31-1956 MRN: [id number]  Primary Cardiologist: [name], MD  Chart reviewed as part of pre-operative protocol coverage.   Simple dental extractions (i.e. 1-2 teeth) are considered low risk procedures per guidelines and generally do not require any specific cardiac clearance. It is also generally accepted that for simple extractions and dental cleanings, there is no need to interrupt blood thinner therapy.  SBE prophylaxis is not required for the patient from a cardiac standpoint.  I will route this recommendation to the requesting party via Epic fax function and remove from pre-op pool.  Please call with questions.  Lenna Sciara, NP 10/02/2022, 12:40 PM

## 2022-11-04 ENCOUNTER — Encounter: Payer: Self-pay | Admitting: Cardiovascular Disease

## 2022-11-04 ENCOUNTER — Ambulatory Visit: Payer: Medicare PPO | Attending: Cardiovascular Disease | Admitting: Cardiovascular Disease

## 2022-11-04 VITALS — BP 110/78 | HR 58 | Ht 71.0 in | Wt 236.5 lb

## 2022-11-04 DIAGNOSIS — I10 Essential (primary) hypertension: Secondary | ICD-10-CM

## 2022-11-04 DIAGNOSIS — I2510 Atherosclerotic heart disease of native coronary artery without angina pectoris: Secondary | ICD-10-CM

## 2022-11-04 DIAGNOSIS — E785 Hyperlipidemia, unspecified: Secondary | ICD-10-CM

## 2022-11-04 NOTE — Progress Notes (Signed)
Cardiology Office Note   Date:  11/04/2022   ID:  Matthew, Doyle August 29, 1956, MRN [id number]  PCP:  Venia Carbon, MD  Cardiologist:   [name], MD   Chief Complaint  Patient presents with   Other    3 month no complaints today. Meds reviewed verbally with pt.      History of Present Illness: Matthew Doyle is a 66 y.o. male who presents for a follow-up visit regarding coronary artery disease.  He is s/p angioplasty and drug-eluting stent placement to the LAD in 2004 and PCI and drug-eluting stent placement to OM 2 in January 2014 after a small non-ST elevation myocardial infarction. Cardiac catheterization at that time showed patent LAD stent, occluded right PDA distally supplying a small territory and 99% proximal OM 2 stenosis .  Ejection fraction was normal. He presented in July, 2017 with anterolateral ST elevation myocardial infarction complicated by ventricular fibrillation. Cardiac catheterization showed subtotal thrombotic occlusion of proximal LAD at the diagonal bifurcation and also thrombus in the mid left circumflex. He underwent aspiration thrombectomy of the diagonal and left circumflex and drug-eluting stent placement to the proximal LAD. LV gram showed an ejection fraction of 45% but subsequent echo showed normal LV systolic function.   He had cervical myelopathy and underwent cervical spine surgery in November 2017.  He has essential tremors  He was seen in March of this year with chest pain and dizziness. Cardiac catheterization was done which showed widely patent LAD stent with no significant restenosis and chronic subtotal occlusion of the right PDA with well-developed collaterals from RV marginal branch.  Ejection fraction was normal with mildly elevated left ventricular end-diastolic pressure.  He has been doing reasonably well overall with no chest pain or shortness of breath.  He continues to have vague abdominal pain of unclear etiology that  happens most frequently in the morning.  He has been evaluated by this.  During his last visit, his blood pressure was elevated at 158/94.  Amlodipine was increased to 10 mg daily.   Past Medical History:  Diagnosis Date   Arthritis    knee   Atrial fibrillation (HCC) post MI    CAD (coronary artery disease)    a. 09/2003 Cath/PCI: LAD 80% (3.0x23 Cypher DES);  b. NSTEMI 12/2012 cath patent LAD stent, included distal right PDA supplying a small territory, 99% proximal OM 2 (DES); c. 06/20/2016 Anlat STEMI/PCI: Stent to LAD, PTCA D1 & LCX; d. 01/2022 Cath: LM mild dzs, LAD 10p/m ISR, D1 80, LCX 25m OM1 patent, RCA 20p, RPDA 99 w/ R->R collats from AM. EF 55-65%-->Med Rx.   Colon polyps    Diverticulosis    Gout    Hemorrhoids    Hyperlipidemia    Hypertension    Internal hemorrhoids without mention of complication    [facility]ocardial infarction ([facility]    Sleep apnea    ST elevation (STEMI) myocardial infarction [place] anterior descending coronary artery ([facility] 06/20/2016    Past Surgical History:  Procedure Laterality Date   BACK SURGERY  10/20/2016   [place]   s/p stent   CARDIAC CATHETERIZATION N/A 06/20/2016   Procedure: Left Heart Cath and Coronary Angiography;  Surgeon: MSherren Mocha MD;  [place] [facility];  Service: Cardiovascular;  Laterality: N/A;   CARDIAC CATHETERIZATION N/A 06/20/2016   Procedure: Coronary Stent Intervention;  Surgeon: MSherren Mocha MD;  [place] [facility];  Service: Cardiovascular;  Laterality: N/A;   CHOLECYSTECTOMY N/A 03/13/2016   Procedure: LAPAROSCOPIC CHOLECYSTECTOMY WITH INTRAOPERATIVE CHOLANGIOGRAM;  Surgeon: Christene Lye, MD;  Location: [facility] ORS;  Service: General;  Laterality: N/A;   CHOLECYSTECTOMY, LAPAROSCOPIC  03/13/2016   Infection - lengthy diagnosis d/t lack of gallstones   COLONOSCOPY  10/2013   CORONARY ANGIOPLASTY     STENTS   Cypher  Stent  09/2003   mid LAD- gupta   LEFT HEART CATH AND CORONARY ANGIOGRAPHY Left 02/28/2022   Procedure: LEFT HEART CATH AND CORONARY ANGIOGRAPHY;  Surgeon: Wellington Hampshire, MD;  Location: [place] CV LAB;  Service: Cardiovascular;  Laterality: Left;   LEFT HEART CATHETERIZATION WITH CORONARY ANGIOGRAM N/A 12/17/2012   Procedure: LEFT HEART CATHETERIZATION WITH CORONARY ANGIOGRAM;  Surgeon: Peter M Martinique, MD;  Location: [facility] CATH LAB;  Service: Cardiovascular;  Laterality: N/A;   MULTIPLE TOOTH EXTRACTIONS     PERCUTANEOUS CORONARY STENT INTERVENTION (PCI-S) Right 12/17/2012   Procedure: PERCUTANEOUS CORONARY STENT INTERVENTION (PCI-S);  Surgeon: Peter M Martinique, MD;  Location: [facility] CATH LAB;  Service: Cardiovascular;  Laterality: Right;     Current Outpatient Medications  Medication Sig Dispense Refill   amLODipine (NORVASC) 10 MG tablet Take 1 tablet (10 mg total) by mouth daily. 90 tablet 1   aspirin 81 MG tablet Take 1 tablet (81 mg total) by mouth daily. 30 tablet    atorvastatin (LIPITOR) 80 MG tablet Take 1 tablet (80 mg total) by mouth daily. 90 tablet 3   carvedilol (COREG) 3.125 MG tablet TAKE 1 TABLET BY MOUTH TWICE A DAY WITH A MEAL 180 tablet 0   clopidogrel (PLAVIX) 75 MG tablet TAKE 1 TABLET BY MOUTH ONCE A DAY. STOP BRILINTA 90 tablet 1   losartan (COZAAR) 100 MG tablet Take 1 tablet (100 mg total) by mouth daily. 90 tablet 1   meclizine (ANTIVERT) 12.5 MG tablet Take 1 tablet (12.5 mg total) by mouth 3 (three) times daily as needed for dizziness. 30 tablet 0   nitroGLYCERIN (NITROSTAT) 0.4 MG SL tablet Place 1 tablet (0.4 mg total) under the tongue every 5 (five) minutes as needed for chest pain. 25 tablet 1   No current facility-administered medications for this visit.    Allergies:   Lisinopril    Social History:  The patient  reports that he has never smoked. He has been exposed to tobacco smoke. He has never used smokeless tobacco. He reports that he does not drink  alcohol and does not use drugs.   Family History:  The patient's family history includes Alzheimer's disease in his maternal grandmother; Coronary artery disease in his father and maternal grandfather; Diabetes in his father; Heart attack in his maternal grandfather, maternal uncle, and paternal uncle; Hypertension in his brother and mother; Throat cancer in his maternal grandfather.    ROS:  Please see the history of present illness.   Otherwise, review of systems are positive for none.   All other systems are reviewed and negative.    PHYSICAL EXAM: VS:  BP 110/78 (BP Location: Left Arm, Patient Position: Sitting, Cuff Size: Large)   Pulse (!) 58   Ht '5\' 11"'$  (1.803 m)   Wt 236 lb 8 oz (107.3 kg)   SpO2 98%   BMI 32.99 kg/m  , BMI Body mass index is 32.99 kg/m. GEN: Well nourished, well developed, in no acute distress  HEENT: normal  Neck: no JVD, carotid bruits, or masses Cardiac: RRR; no murmurs, rubs, or gallops,no edema  Respiratory:  clear to auscultation bilaterally, normal work of breathing GI: soft, nontender, nondistended, + BS MS: no deformity or atrophy  Skin: warm and dry, no rash Neuro:  Strength and sensation are intact Psych: euthymic mood, full affect   EKG:  EKG is not ordered today.    Recent Labs: 08/29/2022: ALT 17; BUN 18; Creatinine, Ser 1.18; Hemoglobin 14.5; Platelets 172.0; Potassium 4.0; Sodium 141    Lipid Panel    Component Value Date/Time   CHOL 111 08/29/2022 0930   CHOL 91 (L) 03/31/2013 0914   TRIG 136.0 08/29/2022 0930   HDL 28.90 (L) 08/29/2022 0930   HDL 36 (L) 03/31/2013 0914   CHOLHDL 4 08/29/2022 0930   VLDL 27.2 08/29/2022 0930   LDLCALC 55 08/29/2022 0930   LDLCALC 47 03/31/2013 0914      Wt Readings from Last 3 Encounters:  11/04/22 236 lb 8 oz (107.3 kg)  08/29/22 248 lb (112.5 kg)  07/29/22 250 lb 9.6 oz (113.7 kg)       ASSESSMENT AND PLAN:  1.  Coronary artery disease involving native coronary arteries without  angina: He is overall doing well.  Continue medical therapy.  Continue long-term dual antiplatelet therapy as tolerated given multiple cardiac events/PCI.   2. Essential hypertension: Blood pressure improved after increasing amlodipine to 10 mg daily.  He has chronic bradycardia that has been stable on small dose carvedilol.  3. Hyperlipidemia: Continue high dose atorvastatin .   Most recent lipid profile showed an LDL of 55 which is at target.  4.  Sleep apnea: He reports poor tolerance to CPAP which might be contributing to his daytime fatigue.   Disposition: Follow-up in 6 months.  Signed,  [name], MD  11/04/2022 1:36 PM    [place]

## 2022-11-04 NOTE — Patient Instructions (Signed)

## 2022-11-17 ENCOUNTER — Other Ambulatory Visit: Payer: Self-pay | Admitting: Cardiovascular Disease

## 2022-12-08 ENCOUNTER — Other Ambulatory Visit: Payer: Self-pay | Admitting: Cardiovascular Disease

## 2022-12-17 ENCOUNTER — Other Ambulatory Visit: Payer: Self-pay | Admitting: Cardiovascular Disease

## 2023-01-26 ENCOUNTER — Telehealth: Payer: Self-pay | Admitting: Cardiovascular Disease

## 2023-01-26 MED ORDER — AMLODIPINE BESYLATE 10 MG PO TABS: 10.0000 mg | ORAL_TABLET | Freq: Every day | ORAL | 3 refills | Status: DC

## 2023-01-26 NOTE — Telephone Encounter (Signed)
Spoke with pt, refill for amlodipine sent to the pharmacy.

## 2023-01-26 NOTE — Telephone Encounter (Signed)
Patient called and said that something is wrong electronically with sending in his medication Amlodipine. Please call back to discuss

## 2023-02-18 ENCOUNTER — Other Ambulatory Visit: Payer: Self-pay | Admitting: Cardiovascular Disease

## 2023-03-12 ENCOUNTER — Other Ambulatory Visit: Payer: Self-pay | Admitting: Cardiovascular Disease

## 2023-04-28 ENCOUNTER — Other Ambulatory Visit: Payer: Self-pay | Admitting: Cardiovascular Disease

## 2023-05-13 ENCOUNTER — Ambulatory Visit: Payer: Medicare PPO | Attending: Cardiovascular Disease | Admitting: Cardiovascular Disease

## 2023-05-13 ENCOUNTER — Encounter: Payer: Self-pay | Admitting: Cardiovascular Disease

## 2023-05-13 VITALS — BP 110/70 | HR 54 | Ht 71.0 in | Wt 230.1 lb

## 2023-05-13 DIAGNOSIS — I2510 Atherosclerotic heart disease of native coronary artery without angina pectoris: Secondary | ICD-10-CM | POA: Diagnosis not present

## 2023-05-13 DIAGNOSIS — G4733 Obstructive sleep apnea (adult) (pediatric): Secondary | ICD-10-CM

## 2023-05-13 DIAGNOSIS — E785 Hyperlipidemia, unspecified: Secondary | ICD-10-CM

## 2023-05-13 DIAGNOSIS — I10 Essential (primary) hypertension: Secondary | ICD-10-CM

## 2023-05-13 MED ORDER — AMLODIPINE BESYLATE 10 MG PO TABS: 10.0000 mg | ORAL_TABLET | Freq: Every day | ORAL | 3 refills | Status: DC

## 2023-05-13 MED ORDER — ATORVASTATIN CALCIUM 80 MG PO TABS: 80.0000 mg | ORAL_TABLET | Freq: Every day | ORAL | 3 refills | Status: DC

## 2023-05-13 MED ORDER — LOSARTAN POTASSIUM 100 MG PO TABS: 100.0000 mg | ORAL_TABLET | Freq: Every day | ORAL | 3 refills | Status: DC

## 2023-05-13 MED ORDER — CLOPIDOGREL BISULFATE 75 MG PO TABS: 75.0000 mg | ORAL_TABLET | Freq: Every day | ORAL | 3 refills | Status: DC

## 2023-05-13 NOTE — Patient Instructions (Signed)
Medication Instructions:  STOP the Carvedilol *If you need a refill on your cardiac medications before your next appointment, please call your pharmacy*   Lab Work: None ordered If you have labs (blood work) drawn today and your tests are completely normal, you will receive your results only by: MyChart Message (if you have MyChart) OR A paper copy in the mail If you have any lab test that is abnormal or we need to change your treatment, we will call you to review the results.   Testing/Procedures: None ordered   Follow-Up: At [facility], you and your health needs are our priority.  As part of our continuing mission to provide you with exceptional heart care, we have created designated Provider Care Teams.  These Care Teams include your primary Cardiologist (physician) and Advanced Practice Providers (APPs -  Physician Assistants and Nurse Practitioners) who all work together to provide you with the care you need, when you need it.  We recommend signing up for the patient portal called "MyChart".  Sign up information is provided on this After Visit Summary.  MyChart is used to connect with patients for Virtual Visits (Telemedicine).  Patients are able to view lab/test results, encounter notes, upcoming appointments, etc.  Non-urgent messages can be sent to your provider as well.   To learn more about what you can do with MyChart, go to [email or web address].    Your next appointment:   12 month(s)  Provider:   You may see Lorine Bears, MD or one of the following Advanced Practice Providers on your designated Care Team:   Nicolasa Ducking, NP Eula Listen, PA-C Cadence Fransico Michael, PA-C Charlsie Quest, NP

## 2023-05-13 NOTE — Progress Notes (Signed)
Cardiology Office Note   Date:  05/13/2023   ID:  Matthew Doyle Sep 25, 1956, MRN [id number]  PCP:  Karie Schwalbe, MD  Cardiologist:   Lorine Bears, MD   Chief Complaint  Patient presents with   Follow-up    6 month f/u no complaints today. Meds reviewed verbally with pt.      History of Present Illness: Matthew Doyle is a 67 y.o. male who presents for a follow-up visit regarding coronary artery disease. He is s/p angioplasty and drug-eluting stent placement to the LAD in 2004 and PCI and drug-eluting stent placement to OM 2 in January 2014 after a small non-ST elevation myocardial infarction.  He presented in July, 2017 with anterolateral ST elevation myocardial infarction complicated by ventricular fibrillation. Cardiac catheterization showed subtotal thrombotic occlusion of proximal LAD at the diagonal bifurcation and also thrombus in the mid left circumflex. He underwent aspiration thrombectomy of the diagonal and left circumflex and drug-eluting stent placement to the proximal LAD. LV gram showed an ejection fraction of 45% but subsequent echo showed normal LV systolic function.   He had cervical myelopathy and underwent cervical spine surgery in November 2017.  He has essential tremors  Most recent cardiac catheterization was done in March 2023 and showed widely patent LAD stent with no significant restenosis and chronic subtotal occlusion of the right PDA with well-developed collaterals from RV marginal branch.  Ejection fraction was normal with mildly elevated left ventricular end-diastolic pressure.  He has been doing well with no recent chest pain, shortness of breath or palpitations.  He continues to complain of right-sided back and abdominal pain of unclear etiology.  He had workup for this with his primary care physician and also by neurosurgery.  His symptoms are consistent with thoracic radiculopathy.   Past Medical History:  Diagnosis Date   Arthritis     knee   Atrial fibrillation (HCC) post MI    CAD (coronary artery disease)    a. 09/2003 Cath/PCI: LAD 80% (3.0x23 Cypher DES);  b. NSTEMI 12/2012 cath patent LAD stent, included distal right PDA supplying a small territory, 99% proximal OM 2 (DES); c. 06/20/2016 Anlat STEMI/PCI: Stent to LAD, PTCA D1 & LCX; d. 01/2022 Cath: LM mild dzs, LAD 10p/m ISR, D1 80, LCX 33m, OM1 patent, RCA 20p, RPDA 99 w/ R->R collats from AM. EF 55-65%-->Med Rx.   Colon polyps    Diverticulosis    Gout    Hemorrhoids    Hyperlipidemia    Hypertension    Internal hemorrhoids without mention of complication    Myocardial infarction [facility])    Sleep apnea    ST elevation (STEMI) myocardial infarction involving left anterior descending coronary artery (HCC) 06/20/2016    Past Surgical History:  Procedure Laterality Date   BACK SURGERY  10/20/2016   Cervical fusion--Dr.Chester Tresa Res   CARDIAC CATHETERIZATION  2014   s/p stent   CARDIAC CATHETERIZATION N/A 06/20/2016   Procedure: Left Heart Cath and Coronary Angiography;  Surgeon: Tonny Bollman, MD;  Location: [facility] INVASIVE CV LAB;  Service: Cardiovascular;  Laterality: N/A;   CARDIAC CATHETERIZATION N/A 06/20/2016   Procedure: Coronary Stent Intervention;  Surgeon: Tonny Bollman, MD;  Location: [facility] INVASIVE CV LAB;  Service: Cardiovascular;  Laterality: N/A;   CHOLECYSTECTOMY N/A 03/13/2016   Procedure: LAPAROSCOPIC CHOLECYSTECTOMY WITH INTRAOPERATIVE CHOLANGIOGRAM;  Surgeon: Kieth Brightly, MD;  Location: [facility] ORS;  Service: General;  Laterality: N/A;   CHOLECYSTECTOMY, LAPAROSCOPIC  03/13/2016   Infection -  lengthy diagnosis d/t lack of gallstones   COLONOSCOPY  10/2013   CORONARY ANGIOPLASTY     STENTS   Cypher Stent  09/2003   mid LAD- gupta   LEFT HEART CATH AND CORONARY ANGIOGRAPHY Left 02/28/2022   Procedure: LEFT HEART CATH AND CORONARY ANGIOGRAPHY;  Surgeon: Iran Ouch, MD;  Location: [facility] INVASIVE CV LAB;  Service: Cardiovascular;   Laterality: Left;   LEFT HEART CATHETERIZATION WITH CORONARY ANGIOGRAM N/A 12/17/2012   Procedure: LEFT HEART CATHETERIZATION WITH CORONARY ANGIOGRAM;  Surgeon: Peter M Swaziland, MD;  Location: [facility] CATH LAB;  Service: Cardiovascular;  Laterality: N/A;   MULTIPLE TOOTH EXTRACTIONS     PERCUTANEOUS CORONARY STENT INTERVENTION (PCI-S) Right 12/17/2012   Procedure: PERCUTANEOUS CORONARY STENT INTERVENTION (PCI-S);  Surgeon: Peter M Swaziland, MD;  Location: [facility] CATH LAB;  Service: Cardiovascular;  Laterality: Right;     Current Outpatient Medications  Medication Sig Dispense Refill   amLODipine (NORVASC) 10 MG tablet Take 1 tablet (10 mg total) by mouth daily. 90 tablet 3   aspirin 81 MG tablet Take 1 tablet (81 mg total) by mouth daily. 30 tablet    atorvastatin (LIPITOR) 80 MG tablet TAKE ONE TABLET BY MOUTH ONCE A DAY 90 tablet 0   carvedilol (COREG) 3.125 MG tablet TAKE 1 TABLET BY MOUTH TWICE A DAY WITH A MEAL 180 tablet 1   clopidogrel (PLAVIX) 75 MG tablet TAKE 1 TABLET BY MOUTH ONCE A DAY. STOP BRILINTA 90 tablet 1   losartan (COZAAR) 100 MG tablet TAKE 1 TABLET BY MOUTH ONCE A DAY 90 tablet 1   meclizine (ANTIVERT) 12.5 MG tablet Take 1 tablet (12.5 mg total) by mouth 3 (three) times daily as needed for dizziness. 30 tablet 0   nitroGLYCERIN (NITROSTAT) 0.4 MG SL tablet Place 1 tablet (0.4 mg total) under the tongue every 5 (five) minutes as needed for chest pain. 25 tablet 1   No current facility-administered medications for this visit.    Allergies:   Lisinopril    Social History:  The patient  reports that he has never smoked. He has been exposed to tobacco smoke. He has never used smokeless tobacco. He reports that he does not drink alcohol and does not use drugs.   Family History:  The patient's family history includes Alzheimer's disease in his maternal grandmother; Coronary artery disease in his father and maternal grandfather; Diabetes in his father; Heart attack in his maternal  grandfather, maternal uncle, and paternal uncle; Hypertension in his brother and mother; Throat cancer in his maternal grandfather.    ROS:  Please see the history of present illness.   Otherwise, review of systems are positive for none.   All other systems are reviewed and negative.    PHYSICAL EXAM: VS:  BP 110/70 (BP Location: Left Arm, Patient Position: Sitting, Cuff Size: Large)   Pulse (!) 54   Ht 5\' 11"  (1.803 m)   Wt 230 lb 2 oz (104.4 kg)   SpO2 98%   BMI 32.10 kg/m  , BMI Body mass index is 32.1 kg/m. GEN: Well nourished, well developed, in no acute distress  HEENT: normal  Neck: no JVD, carotid bruits, or masses Cardiac: RRR; no murmurs, rubs, or gallops,no edema  Respiratory:  clear to auscultation bilaterally, normal work of breathing GI: soft, nontender, nondistended, + BS MS: no deformity or atrophy  Skin: warm and dry, no rash Neuro:  Strength and sensation are intact Psych: euthymic mood, full affect   EKG:  EKG  is ordered today. EKG showed sinus bradycardia with no significant ST or T wave changes.   Recent Labs: 08/29/2022: ALT 17; BUN 18; Creatinine, Ser 1.18; Hemoglobin 14.5; Platelets 172.0; Potassium 4.0; Sodium 141    Lipid Panel    Component Value Date/Time   CHOL 111 08/29/2022 0930   CHOL 91 (L) 03/31/2013 0914   TRIG 136.0 08/29/2022 0930   HDL 28.90 (L) 08/29/2022 0930   HDL 36 (L) 03/31/2013 0914   CHOLHDL 4 08/29/2022 0930   VLDL 27.2 08/29/2022 0930   LDLCALC 55 08/29/2022 0930   LDLCALC 47 03/31/2013 0914      Wt Readings from Last 3 Encounters:  05/13/23 230 lb 2 oz (104.4 kg)  11/04/22 236 lb 8 oz (107.3 kg)  08/29/22 248 lb (112.5 kg)       ASSESSMENT AND PLAN:  1.  Coronary artery disease involving native coronary arteries without angina: He is overall doing well.  Continue medical therapy.  Continue long-term dual antiplatelet therapy as tolerated given multiple cardiac events/PCI.  I refilled his medications   2.  Essential hypertension: He has chronic bradycardia and his blood pressure is controlled.  Thus, I elected to discontinue small dose carvedilol given that ejection fraction is normal.  3. Hyperlipidemia: Continue high dose atorvastatin which was refilled today   Most recent lipid profile showed an LDL of 55 which is at target.  4.  Sleep apnea: He reports poor tolerance to CPAP which might be contributing to his daytime fatigue.   Disposition: Follow-up in 12 months.  Signed,  Lorine Bears, MD  05/13/2023 9:04 AM     [facility]

## 2023-05-22 ENCOUNTER — Ambulatory Visit: Payer: Medicare PPO | Admitting: Internal Medicine

## 2023-05-22 ENCOUNTER — Encounter: Payer: Self-pay | Admitting: Internal Medicine

## 2023-05-22 VITALS — BP 136/84 | HR 56 | Temp 97.4°F | Ht 71.0 in | Wt 231.0 lb

## 2023-05-22 DIAGNOSIS — G8929 Other chronic pain: Secondary | ICD-10-CM | POA: Insufficient documentation

## 2023-05-22 DIAGNOSIS — M792 Neuralgia and neuritis, unspecified: Secondary | ICD-10-CM

## 2023-05-22 MED ORDER — GABAPENTIN 300 MG PO CAPS: 300.0000 mg | ORAL_CAPSULE | Freq: Every day | ORAL | 3 refills | Status: DC

## 2023-05-22 NOTE — Progress Notes (Signed)
Subjective:    Patient ID: Matthew Doyle, male    DOB: 02/14/1956, 67 y.o.   MRN: [id number]  HPI Here for ongoing right flank/abdominal pain  Goes back 2 years or more Never went away but now it is "unbearable" Hard to lie down--can't turn over Never had rash  Current Outpatient Medications on File Prior to Visit  Medication Sig Dispense Refill   amLODipine (NORVASC) 10 MG tablet Take 1 tablet (10 mg total) by mouth daily. 90 tablet 3   aspirin 81 MG tablet Take 1 tablet (81 mg total) by mouth daily. 30 tablet    atorvastatin (LIPITOR) 80 MG tablet Take 1 tablet (80 mg total) by mouth daily. 90 tablet 3   clopidogrel (PLAVIX) 75 MG tablet Take 1 tablet (75 mg total) by mouth daily. 90 tablet 3   losartan (COZAAR) 100 MG tablet Take 1 tablet (100 mg total) by mouth daily. 90 tablet 3   meclizine (ANTIVERT) 12.5 MG tablet Take 1 tablet (12.5 mg total) by mouth 3 (three) times daily as needed for dizziness. 30 tablet 0   nitroGLYCERIN (NITROSTAT) 0.4 MG SL tablet Place 1 tablet (0.4 mg total) under the tongue every 5 (five) minutes as needed for chest pain. 25 tablet 1   No current facility-administered medications on file prior to visit.    Allergies  Allergen Reactions   Lisinopril Cough    REACTION: cough with this and/or benazepril    Past Medical History:  Diagnosis Date   Arthritis    knee   Atrial fibrillation (HCC) post MI    CAD (coronary artery disease)    a. 09/2003 Cath/PCI: LAD 80% (3.0x23 Cypher DES);  b. NSTEMI 12/2012 cath patent LAD stent, included distal right PDA supplying a small territory, 99% proximal OM 2 (DES); c. 06/20/2016 Anlat STEMI/PCI: Stent to LAD, PTCA D1 & LCX; d. 01/2022 Cath: LM mild dzs, LAD 10p/m ISR, D1 80, LCX 21m, OM1 patent, RCA 20p, RPDA 99 w/ R->R collats from AM. EF 55-65%-->Med Rx.   Colon polyps    Diverticulosis    Gout    Hemorrhoids    Hyperlipidemia    Hypertension    Internal hemorrhoids without mention of complication     Myocardial infarction [facility])    Sleep apnea    ST elevation (STEMI) myocardial infarction involving left anterior descending coronary artery (HCC) 06/20/2016    Past Surgical History:  Procedure Laterality Date   BACK SURGERY  10/20/2016   Cervical fusion--Dr.Chester Tresa Res   CARDIAC CATHETERIZATION  2014   s/p stent   CARDIAC CATHETERIZATION N/A 06/20/2016   Procedure: Left Heart Cath and Coronary Angiography;  Surgeon: Tonny Bollman, MD;  [facility] INVASIVE CV LAB;  Service: Cardiovascular;  Laterality: N/A;   CARDIAC CATHETERIZATION N/A 06/20/2016   Procedure: Coronary Stent Intervention;  Surgeon: Tonny Bollman, MD;  Location: [facility] INVASIVE CV LAB;  Service: Cardiovascular;  Laterality: N/A;   CHOLECYSTECTOMY N/A 03/13/2016   Procedure: LAPAROSCOPIC CHOLECYSTECTOMY WITH INTRAOPERATIVE CHOLANGIOGRAM;  Surgeon: Kieth Brightly, MD;  Location: [facility] ORS;  Service: General;  Laterality: N/A;   CHOLECYSTECTOMY, LAPAROSCOPIC  03/13/2016   Infection - lengthy diagnosis d/t lack of gallstones   COLONOSCOPY  10/2013   CORONARY ANGIOPLASTY     STENTS   Cypher Stent  09/2003   mid LAD- gupta   LEFT HEART CATH AND CORONARY ANGIOGRAPHY Left 02/28/2022   Procedure: LEFT HEART CATH AND CORONARY ANGIOGRAPHY;  Surgeon: Iran Ouch, MD;  Location: [facility] INVASIVE  CV LAB;  Service: Cardiovascular;  Laterality: Left;   LEFT HEART CATHETERIZATION WITH CORONARY ANGIOGRAM N/A 12/17/2012   Procedure: LEFT HEART CATHETERIZATION WITH CORONARY ANGIOGRAM;  Surgeon: Peter M Swaziland, MD;  Location: [facility] CATH LAB;  Service: Cardiovascular;  Laterality: N/A;   MULTIPLE TOOTH EXTRACTIONS     PERCUTANEOUS CORONARY STENT INTERVENTION (PCI-S) Right 12/17/2012   Procedure: PERCUTANEOUS CORONARY STENT INTERVENTION (PCI-S);  Surgeon: Peter M Swaziland, MD;  Location: [facility] CATH LAB;  Service: Cardiovascular;  Laterality: Right;    Family History  Problem Relation Age of Onset   Diabetes Father    Coronary  artery disease Father        CABG - 5 vessel 15 yr ago   Heart attack Paternal Uncle        Age 88   Heart attack Maternal Uncle        Age 77   Coronary artery disease Maternal Grandfather    Heart attack Maternal Grandfather        Died in 56s   Throat cancer Maternal Grandfather    Hypertension Mother    Alzheimer's disease Maternal Grandmother    Hypertension Brother    Colon cancer Neg Hx    Esophageal cancer Neg Hx    Rectal cancer Neg Hx    Stomach cancer Neg Hx     Social History   Socioeconomic History   Marital status: Married    Spouse name: Not on file   Number of children: 2   Years of education: Not on file   Highest education level: Not on file  Occupational History   Occupation: Insurance claims handler    Comment: part time   Occupation: Sports administrator: [employer]    Comment: Retired  Tobacco Use   Smoking status: Never    Passive exposure: Past   Smokeless tobacco: Never  Building services engineer Use: Never used  Substance and Sexual Activity   Alcohol use: No    Alcohol/week: 0.0 standard drinks of alcohol   Drug use: No   Sexual activity: Yes    Partners: Female  Other Topics Concern   Not on file  Social History Narrative   Retired as Engineer, 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      No living will   Wife would be health care POA---alternate is her sons   Would accept resuscitation attempts   Not sure about tube feeds      Social Determinants of Health   Financial Resource Strain: Not on file  Food Insecurity: Not on file  Transportation Needs: Not on file  Physical Activity: Not on file  Stress: Not on file  Social Connections: Not on file  Intimate Partner Violence: Not on file   Review of Systems Did have cath last year---no worrisome findings No N/V Eating okay Had teeth pulled last year---has dentures    Objective:   Physical Exam Abdominal:     Comments: Sensitive around T12 or L1 dermatome--across abdomen and back.  Doesn't cross midline and does have change in sensation topically            Assessment & Plan:

## 2023-05-22 NOTE — Assessment & Plan Note (Signed)
This goes back years Abdominal CT, spine MRI, cardiac cath--all negative Doesn't remember any rash or shingles---post most consistent with post herpetic pain Has taken gabapentin in the past--did okay with it (but didn't help the condition he took it for) Biggest problem is bedtime---will try gabapentin 300mg  at bedtime and increase to 600mg  after a week (and we can titrate more if needed). If needs daytime Rx--would give 100mg  bid and move from there.  If this doesn't work or isn't tolerated--next step would be duloxetine or nortriptyline.

## 2023-07-03 ENCOUNTER — Encounter: Payer: Self-pay | Admitting: Internal Medicine

## 2023-07-16 ENCOUNTER — Encounter (INDEPENDENT_AMBULATORY_CARE_PROVIDER_SITE_OTHER): Payer: Self-pay

## 2023-08-31 ENCOUNTER — Encounter: Payer: Self-pay | Admitting: Internal Medicine

## 2023-08-31 ENCOUNTER — Ambulatory Visit (INDEPENDENT_AMBULATORY_CARE_PROVIDER_SITE_OTHER): Payer: Medicare PPO | Admitting: Internal Medicine

## 2023-08-31 VITALS — BP 112/74 | HR 55 | Temp 99.4°F | Ht 70.0 in | Wt 235.0 lb

## 2023-08-31 DIAGNOSIS — Z0000 Encounter for general adult medical examination without abnormal findings: Secondary | ICD-10-CM

## 2023-08-31 DIAGNOSIS — I2510 Atherosclerotic heart disease of native coronary artery without angina pectoris: Secondary | ICD-10-CM

## 2023-08-31 DIAGNOSIS — Z1159 Encounter for screening for other viral diseases: Secondary | ICD-10-CM | POA: Diagnosis not present

## 2023-08-31 DIAGNOSIS — M792 Neuralgia and neuritis, unspecified: Secondary | ICD-10-CM

## 2023-08-31 DIAGNOSIS — Z125 Encounter for screening for malignant neoplasm of prostate: Secondary | ICD-10-CM | POA: Diagnosis not present

## 2023-08-31 DIAGNOSIS — Z23 Encounter for immunization: Secondary | ICD-10-CM | POA: Diagnosis not present

## 2023-08-31 DIAGNOSIS — G214 Vascular parkinsonism: Secondary | ICD-10-CM | POA: Diagnosis not present

## 2023-08-31 DIAGNOSIS — G8929 Other chronic pain: Secondary | ICD-10-CM

## 2023-08-31 DIAGNOSIS — I10 Essential (primary) hypertension: Secondary | ICD-10-CM

## 2023-08-31 LAB — COMPREHENSIVE METABOLIC PANEL
ALT: 17 U/L (ref 0–53)
AST: 13 U/L (ref 0–37)
Albumin: 4.2 g/dL (ref 3.5–5.2)
Alkaline Phosphatase: 63 U/L (ref 39–117)
BUN: 12 mg/dL (ref 6–23)
CO2: 31 meq/L (ref 19–32)
Calcium: 9.4 mg/dL (ref 8.4–10.5)
Chloride: 103 meq/L (ref 96–112)
Creatinine, Ser: 1.01 mg/dL (ref 0.40–1.50)
GFR: 76.72 mL/min (ref >60.00)
Glucose, Bld: 89 mg/dL (ref 70–99)
Potassium: 4.1 meq/L (ref 3.5–5.1)
Sodium: 141 meq/L (ref 135–145)
Total Bilirubin: 0.9 mg/dL (ref 0.2–1.2)
Total Protein: 6.8 g/dL (ref 6.0–8.3)

## 2023-08-31 LAB — LIPID PANEL
Cholesterol: 98 mg/dL (ref 0–200)
HDL: 39.9 mg/dL (ref >39.00)
LDL Cholesterol: 40 mg/dL (ref 0–99)
NonHDL: 58.3
Total CHOL/HDL Ratio: 2
Triglycerides: 91 mg/dL (ref 0.0–149.0)
VLDL: 18.2 mg/dL (ref 0.0–40.0)

## 2023-08-31 LAB — CBC
HCT: 43.9 % (ref 39.0–52.0)
Hemoglobin: 14.6 g/dL (ref 13.0–17.0)
MCHC: 33.2 g/dL (ref 30.0–36.0)
MCV: 86.2 fL (ref 78.0–100.0)
Platelets: 210 10*3/uL (ref 150.0–400.0)
RBC: 5.09 Mil/uL (ref 4.22–5.81)
RDW: 14.6 % (ref 11.5–15.5)
WBC: 7.8 10*3/uL (ref 4.0–10.5)

## 2023-08-31 LAB — PSA, MEDICARE: PSA: 1.23 ng/mL (ref 0.10–4.00)

## 2023-08-31 MED ORDER — GABAPENTIN 300 MG PO CAPS: 300.0000 mg | ORAL_CAPSULE | Freq: Every day | ORAL | 3 refills | Status: DC

## 2023-08-31 NOTE — Assessment & Plan Note (Signed)
I have personally reviewed the Medicare Annual Wellness questionnaire and have noted 1. The patient's medical and social history 2. Their use of alcohol, tobacco or illicit drugs 3. Their current medications and supplements 4. The patient's functional ability including ADL's, fall risks, home safety risks and hearing or visual             impairment. 5. Diet and physical activities 6. Evidence for depression or mood disorders  The patients weight, height, BMI and visual acuity have been recorded in the chart I have made referrals, counseling and provided education to the patient based review of the above and I have provided the pt with a written personalized care plan for preventive services.  I have provided you with a copy of your personalized plan for preventive services. Please take the time to review along with your updated medication list.  Discussed exercise Colon due 2027 Will recheck PSA Flu vaccine today He prefers no COVID vaccine

## 2023-08-31 NOTE — Assessment & Plan Note (Signed)
Doing better with the gabapentin 600mg  at bedtime

## 2023-08-31 NOTE — Progress Notes (Signed)
Subjective:    Patient ID: Matthew Doyle, male    DOB: [birth year], 67 y.o.   MRN: [id number]  HPI Here for Medicare wellness visit and follow up of chronic health conditions Reviewed advanced directives Reviewed other doctors---Dr Arida--cardiology, Dr Sharmon Leyden, Ms Koch--neurosurgery, Dr Beckey Rutter, [facility] Stays active but no set exercise--discussed Vision is okay Hearing is okay No alcohol or tobacco Larey Seat once --may have some pain in right knee still No depression or anhedonia Independent with instrumental ADLs No sig memory issues  Gabapentin is helping Just at bedtime--sleeping better now Did have a bad day about a month ago---mostly better in the day  No heart trouble No chest pain or SOB No palpitations No dizziness or syncope No edema No headaches  Still gets shaky hands Some stiffness No worsening  Current Outpatient Medications on File Prior to Visit  Medication Sig Dispense Refill   amLODipine (NORVASC) 10 MG tablet Take 1 tablet (10 mg total) by mouth daily. 90 tablet 3   aspirin 81 MG tablet Take 1 tablet (81 mg total) by mouth daily. 30 tablet    atorvastatin (LIPITOR) 80 MG tablet Take 1 tablet (80 mg total) by mouth daily. 90 tablet 3   clopidogrel (PLAVIX) 75 MG tablet Take 1 tablet (75 mg total) by mouth daily. 90 tablet 3   gabapentin (NEURONTIN) 300 MG capsule Take 1-2 capsules (300-600 mg total) by mouth at bedtime. 60 capsule 3   losartan (COZAAR) 100 MG tablet Take 1 tablet (100 mg total) by mouth daily. 90 tablet 3   meclizine (ANTIVERT) 12.5 MG tablet Take 1 tablet (12.5 mg total) by mouth 3 (three) times daily as needed for dizziness. 30 tablet 0   nitroGLYCERIN (NITROSTAT) 0.4 MG SL tablet Place 1 tablet (0.4 mg total) under the tongue every 5 (five) minutes as needed for chest pain. 25 tablet 1   No current facility-administered medications on file prior to visit.    Allergies  Allergen Reactions   Lisinopril Cough     REACTION: cough with this and/or benazepril    Past Medical History:  Diagnosis Date   Arthritis    knee   Atrial fibrillation (HCC) post MI    CAD (coronary artery disease)    a. 09/2003 Cath/PCI: LAD 80% (3.0x23 Cypher DES);  b. NSTEMI 12/2012 cath patent LAD stent, included distal right PDA supplying a small territory, 99% proximal OM 2 (DES); c. 06/20/2016 Anlat STEMI/PCI: Stent to LAD, PTCA D1 & LCX; d. 01/2022 Cath: LM mild dzs, LAD 10p/m ISR, D1 80, LCX 34m, OM1 patent, RCA 20p, RPDA 99 w/ R->R collats from AM. EF 55-65%-->Med Rx.   Colon polyps    Diverticulosis    Gout    Hemorrhoids    Hyperlipidemia    Hypertension    Internal hemorrhoids without mention of complication    Myocardial infarction [facility])    Sleep apnea    ST elevation (STEMI) myocardial infarction involving left anterior descending coronary artery (HCC) 06/20/2016    Past Surgical History:  Procedure Laterality Date   BACK SURGERY  10/20/2016   Cervical fusion--Dr.Chester Tresa Res   CARDIAC CATHETERIZATION  2014   s/p stent   CARDIAC CATHETERIZATION N/A 06/20/2016   Procedure: Left Heart Cath and Coronary Angiography;  Surgeon: Tonny Bollman, MD;  Location: [facility] INVASIVE CV LAB;  Service: Cardiovascular;  Laterality: N/A;   CARDIAC CATHETERIZATION N/A 06/20/2016   Procedure: Coronary Stent Intervention;  Surgeon: Tonny Bollman, MD;  Location: [facility] INVASIVE CV LAB;  Service: Cardiovascular;  Laterality: N/A;   CHOLECYSTECTOMY N/A 03/13/2016   Procedure: LAPAROSCOPIC CHOLECYSTECTOMY WITH INTRAOPERATIVE CHOLANGIOGRAM;  Surgeon: Kieth Brightly, MD;  Location: [facility] ORS;  Service: General;  Laterality: N/A;   CHOLECYSTECTOMY, LAPAROSCOPIC  03/13/2016   Infection - lengthy diagnosis d/t lack of gallstones   COLONOSCOPY  10/2013   CORONARY ANGIOPLASTY     STENTS   Cypher Stent  09/2003   mid LAD- gupta   LEFT HEART CATH AND CORONARY ANGIOGRAPHY Left 02/28/2022   Procedure: LEFT HEART CATH AND CORONARY  ANGIOGRAPHY;  Surgeon: Iran Ouch, MD;  Location: [facility] INVASIVE CV LAB;  Service: Cardiovascular;  Laterality: Left;   LEFT HEART CATHETERIZATION WITH CORONARY ANGIOGRAM N/A 12/17/2012   Procedure: LEFT HEART CATHETERIZATION WITH CORONARY ANGIOGRAM;  Surgeon: Peter M Swaziland, MD;  Location: [facility] CATH LAB;  Service: Cardiovascular;  Laterality: N/A;   MULTIPLE TOOTH EXTRACTIONS     PERCUTANEOUS CORONARY STENT INTERVENTION (PCI-S) Right 12/17/2012   Procedure: PERCUTANEOUS CORONARY STENT INTERVENTION (PCI-S);  Surgeon: Peter M Swaziland, MD;  Location: [facility] CATH LAB;  Service: Cardiovascular;  Laterality: Right;    Family History  Problem Relation Age of Onset   Diabetes Father    Coronary artery disease Father        CABG - 5 vessel 15 yr ago   Heart attack Paternal Uncle        Age 71   Heart attack Maternal Uncle        Age 53   Coronary artery disease Maternal Grandfather    Heart attack Maternal Grandfather        Died in 55s   Throat cancer Maternal Grandfather    Hypertension Mother    Alzheimer's disease Maternal Grandmother    Hypertension Brother    Colon cancer Neg Hx    Esophageal cancer Neg Hx    Rectal cancer Neg Hx    Stomach cancer Neg Hx     Social History   Socioeconomic History   Marital status: Married    Spouse name: Not on file   Number of children: 2   Years of education: Not on file   Highest education level: Not on file  Occupational History   Occupation: Insurance claims handler    Comment: part time   Occupation: Sports administrator: [employer]    Comment: Retired  Tobacco Use   Smoking status: Never    Passive exposure: Past   Smokeless tobacco: Never  Vaping Use   Vaping status: Never Used  Substance and Sexual Activity   Alcohol use: No    Alcohol/week: 0.0 standard drinks of alcohol   Drug use: No   Sexual activity: Yes    Partners: Female  Other Topics Concern   Not on file  Social History Narrative   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      No living will   Wife would be health care POA---alternate is her sons   Would accept resuscitation attempts   Not sure about tube feeds--depends on prognosis      Social Determinants of Corporate investment banker Strain: Not on file  Food Insecurity: Not on file  Transportation Needs: Not on file  Physical Activity: Not on file  Stress: Not on file  Social Connections: Not on file  Intimate Partner Violence: Not on file   Review of Systems Appetite is good---limited eating due to getting used to dentures Has lost 10# since last year Wears  seat belt No heartburn or dysphagia Bowels move okay mostly. Easy constipation--uses something if needed Voids okay---stream is fair. Mild dribbling No other back or joint pains No suspicious skin lesions    Objective:   Physical Exam Constitutional:      Appearance: Normal appearance.  HENT:     Mouth/Throat:     Pharynx: No oropharyngeal exudate or posterior oropharyngeal erythema.  Eyes:     Conjunctiva/sclera: Conjunctivae normal.     Pupils: Pupils are equal, round, and reactive to light.  Cardiovascular:     Rate and Rhythm: Normal rate and regular rhythm.     Pulses: Normal pulses.     Heart sounds: No murmur heard.    No gallop.  Pulmonary:     Effort: Pulmonary effort is normal.     Breath sounds: Normal breath sounds. No wheezing or rales.  Abdominal:     Palpations: Abdomen is soft.     Tenderness: There is no abdominal tenderness.  Musculoskeletal:     Cervical back: Neck supple.     Right lower leg: No edema.     Left lower leg: No edema.     Comments: Mild crepitus --right knee. No effusion. No ligament or meniscus findings  Lymphadenopathy:     Cervical: No cervical adenopathy.  Skin:    Findings: No lesion or rash.  Neurological:     General: No focal deficit present.     Mental Status: He is alert and oriented to person, place, and time.     Comments: Word  naming---6/1 minute   Psychiatric:        Mood and Affect: Mood normal.        Behavior: Behavior normal.            Assessment & Plan:

## 2023-08-31 NOTE — Assessment & Plan Note (Signed)
BP Readings from Last 3 Encounters:  08/31/23 112/74  05/22/23 136/84  05/13/23 110/70   Controlled with the losartan

## 2023-08-31 NOTE — Assessment & Plan Note (Signed)
Probably vascular--but mild and no progression No action

## 2023-08-31 NOTE — Patient Instructions (Signed)
You can try over the counter diclofenac gel for your right knee.

## 2023-08-31 NOTE — Assessment & Plan Note (Signed)
No angina ASA 81mg . Atorvastatin 80 daily, losartan 100mg  and plavix

## 2023-08-31 NOTE — Progress Notes (Signed)
Hearing Screening - Comments:: Passed whisper test Vision Screening - Comments:: December 2023

## 2023-08-31 NOTE — Addendum Note (Signed)
Addended by: Eual Fines on: 08/31/2023 03:32 PM   Modules accepted: Orders

## 2023-09-01 LAB — HEPATITIS C ANTIBODY: Hepatitis C Ab: NONREACTIVE

## 2024-01-29 ENCOUNTER — Encounter: Payer: Self-pay | Admitting: Emergency Medicine

## 2024-01-29 ENCOUNTER — Other Ambulatory Visit: Payer: Self-pay

## 2024-01-29 ENCOUNTER — Emergency Department: Payer: Medicare PPO

## 2024-01-29 DIAGNOSIS — Z7902 Long term (current) use of antithrombotics/antiplatelets: Secondary | ICD-10-CM | POA: Diagnosis not present

## 2024-01-29 DIAGNOSIS — E785 Hyperlipidemia, unspecified: Secondary | ICD-10-CM | POA: Insufficient documentation

## 2024-01-29 DIAGNOSIS — M792 Neuralgia and neuritis, unspecified: Secondary | ICD-10-CM | POA: Insufficient documentation

## 2024-01-29 DIAGNOSIS — I4891 Unspecified atrial fibrillation: Secondary | ICD-10-CM | POA: Diagnosis not present

## 2024-01-29 DIAGNOSIS — J101 Influenza due to other identified influenza virus with other respiratory manifestations: Secondary | ICD-10-CM | POA: Diagnosis not present

## 2024-01-29 DIAGNOSIS — Z955 Presence of coronary angioplasty implant and graft: Secondary | ICD-10-CM | POA: Insufficient documentation

## 2024-01-29 DIAGNOSIS — R509 Fever, unspecified: Secondary | ICD-10-CM | POA: Diagnosis present

## 2024-01-29 DIAGNOSIS — R0602 Shortness of breath: Secondary | ICD-10-CM | POA: Diagnosis not present

## 2024-01-29 DIAGNOSIS — R059 Cough, unspecified: Secondary | ICD-10-CM | POA: Diagnosis not present

## 2024-01-29 DIAGNOSIS — Z79899 Other long term (current) drug therapy: Secondary | ICD-10-CM | POA: Diagnosis not present

## 2024-01-29 DIAGNOSIS — R42 Dizziness and giddiness: Secondary | ICD-10-CM | POA: Diagnosis not present

## 2024-01-29 DIAGNOSIS — I10 Essential (primary) hypertension: Secondary | ICD-10-CM | POA: Insufficient documentation

## 2024-01-29 DIAGNOSIS — I2510 Atherosclerotic heart disease of native coronary artery without angina pectoris: Secondary | ICD-10-CM | POA: Insufficient documentation

## 2024-01-29 DIAGNOSIS — R1111 Vomiting without nausea: Secondary | ICD-10-CM | POA: Diagnosis not present

## 2024-01-29 DIAGNOSIS — R5383 Other fatigue: Secondary | ICD-10-CM | POA: Diagnosis not present

## 2024-01-29 DIAGNOSIS — J9601 Acute respiratory failure with hypoxia: Secondary | ICD-10-CM | POA: Insufficient documentation

## 2024-01-29 DIAGNOSIS — Z7982 Long term (current) use of aspirin: Secondary | ICD-10-CM | POA: Insufficient documentation

## 2024-01-29 LAB — CBC WITH DIFFERENTIAL/PLATELET
Abs Immature Granulocytes: 0.01 10*3/uL (ref 0.00–0.07)
Basophils Absolute: 0 10*3/uL (ref 0.0–0.1)
Basophils Relative: 1 %
Eosinophils Absolute: 0.2 10*3/uL (ref 0.0–0.5)
Eosinophils Relative: 2 %
HCT: 43.7 % (ref 39.0–52.0)
Hemoglobin: 14.5 g/dL (ref 13.0–17.0)
Immature Granulocytes: 0 %
Lymphocytes Relative: 16 %
Lymphs Abs: 1.2 10*3/uL (ref 0.7–4.0)
MCH: 29.2 pg (ref 26.0–34.0)
MCHC: 33.2 g/dL (ref 30.0–36.0)
MCV: 87.9 fL (ref 80.0–100.0)
Monocytes Absolute: 1 10*3/uL (ref 0.1–1.0)
Monocytes Relative: 14 %
Neutro Abs: 4.7 10*3/uL (ref 1.7–7.7)
Neutrophils Relative %: 67 %
Platelets: 160 10*3/uL (ref 150–400)
RBC: 4.97 MIL/uL (ref 4.22–5.81)
RDW: 14.1 % (ref 11.5–15.5)
WBC: 7.1 10*3/uL (ref 4.0–10.5)
nRBC: 0 % (ref 0.0–0.2)

## 2024-01-29 LAB — COMPREHENSIVE METABOLIC PANEL
ALT: 23 U/L (ref 0–44)
AST: 24 U/L (ref 15–41)
Albumin: 3.9 g/dL (ref 3.5–5.0)
Alkaline Phosphatase: 53 U/L (ref 38–126)
Anion gap: 11 (ref 5–15)
BUN: 15 mg/dL (ref 8–23)
CO2: 24 mmol/L (ref 22–32)
Calcium: 8.7 mg/dL — ABNORMAL LOW (ref 8.9–10.3)
Chloride: 104 mmol/L (ref 98–111)
Creatinine, Ser: 1 mg/dL (ref 0.61–1.24)
GFR, Estimated: >60 mL/min (ref >60)
Glucose, Bld: 123 mg/dL — ABNORMAL HIGH (ref 70–99)
Potassium: 3.6 mmol/L (ref 3.5–5.1)
Sodium: 139 mmol/L (ref 135–145)
Total Bilirubin: 0.9 mg/dL (ref 0.0–1.2)
Total Protein: 7.6 g/dL (ref 6.5–8.1)

## 2024-01-29 LAB — RESP PANEL BY RT-PCR (RSV, FLU A&B, COVID)  RVPGX2
Influenza A by PCR: POSITIVE — AB
Influenza B by PCR: NEGATIVE
Resp Syncytial Virus by PCR: NEGATIVE
SARS Coronavirus 2 by RT PCR: NEGATIVE

## 2024-01-29 NOTE — ED Triage Notes (Signed)
 To ER via EMS from home with spouse for report of fevers since Wednesday intermittently. Experiencing dizziness, cough, shortness of breath with exertion. Denies any chest pain.

## 2024-01-30 ENCOUNTER — Observation Stay
Admission: EM | Admit: 2024-01-30 | Discharge: 2024-01-31 | Disposition: A | Discharge status: Home / Self Care | Payer: Medicare PPO | Attending: Hospitalist | Admitting: Hospitalist

## 2024-01-30 ENCOUNTER — Other Ambulatory Visit (HOSPITAL_COMMUNITY): Payer: Self-pay

## 2024-01-30 DIAGNOSIS — I10 Essential (primary) hypertension: Secondary | ICD-10-CM | POA: Diagnosis present

## 2024-01-30 DIAGNOSIS — E785 Hyperlipidemia, unspecified: Secondary | ICD-10-CM | POA: Diagnosis present

## 2024-01-30 DIAGNOSIS — J101 Influenza due to other identified influenza virus with other respiratory manifestations: Secondary | ICD-10-CM | POA: Diagnosis not present

## 2024-01-30 DIAGNOSIS — I2510 Atherosclerotic heart disease of native coronary artery without angina pectoris: Secondary | ICD-10-CM | POA: Diagnosis present

## 2024-01-30 DIAGNOSIS — J9601 Acute respiratory failure with hypoxia: Secondary | ICD-10-CM | POA: Insufficient documentation

## 2024-01-30 DIAGNOSIS — G8929 Other chronic pain: Secondary | ICD-10-CM | POA: Diagnosis present

## 2024-01-30 LAB — URINALYSIS, W/ REFLEX TO CULTURE (INFECTION SUSPECTED)
Bacteria, UA: NONE SEEN
Bilirubin Urine: NEGATIVE
Glucose, UA: NEGATIVE mg/dL
Hgb urine dipstick: NEGATIVE
Ketones, ur: 5 mg/dL — AB
Leukocytes,Ua: NEGATIVE
Nitrite: NEGATIVE
Protein, ur: NEGATIVE mg/dL
Specific Gravity, Urine: 1.02 (ref 1.005–1.030)
pH: 5 (ref 5.0–8.0)

## 2024-01-30 LAB — TROPONIN I (HIGH SENSITIVITY): Troponin I (High Sensitivity): 15 ng/L (ref <18)

## 2024-01-30 MED ORDER — MORPHINE SULFATE (PF) 2 MG/ML IV SOLN: 2.0000 mg | INTRAVENOUS | Status: DC | PRN

## 2024-01-30 MED ORDER — GUAIFENESIN ER 600 MG PO TB12
600.0000 mg | ORAL_TABLET | Freq: Two times a day (BID) | ORAL | Status: DC
  Administered 2024-01-30 – 2024-01-31 (×3): 600 mg via ORAL
  Filled 2024-01-30 (×3): qty 1

## 2024-01-30 MED ORDER — GUAIFENESIN-DM 100-10 MG/5ML PO SYRP: 10.0000 mL | ORAL_SOLUTION | ORAL | Status: DC | PRN

## 2024-01-30 MED ORDER — POLYETHYLENE GLYCOL 3350 17 G PO PACK: 17.0000 g | PACK | Freq: Every day | ORAL | Status: DC | PRN

## 2024-01-30 MED ORDER — MIDODRINE HCL 5 MG PO TABS: 10.0000 mg | ORAL_TABLET | Freq: Three times a day (TID) | ORAL | Status: DC

## 2024-01-30 MED ORDER — ALBUTEROL SULFATE (2.5 MG/3ML) 0.083% IN NEBU
2.5000 mg | INHALATION_SOLUTION | RESPIRATORY_TRACT | Status: DC | PRN
  Administered 2024-01-30: 2.5 mg via RESPIRATORY_TRACT
  Filled 2024-01-30: qty 3

## 2024-01-30 MED ORDER — OXYMETAZOLINE HCL 0.05 % NA SOLN
1.0000 | Freq: Once | NASAL | Status: AC
  Administered 2024-01-30: 1 via NASAL
  Filled 2024-01-30: qty 30

## 2024-01-30 MED ORDER — ONDANSETRON HCL 4 MG/2ML IJ SOLN
4.0000 mg | Freq: Once | INTRAMUSCULAR | Status: AC
Start: 2024-01-30 — End: 2024-01-30
  Administered 2024-01-30: 4 mg via INTRAVENOUS
  Filled 2024-01-30: qty 2

## 2024-01-30 MED ORDER — ENOXAPARIN SODIUM 60 MG/0.6ML IJ SOSY
0.5000 mg/kg | PREFILLED_SYRINGE | INTRAMUSCULAR | Status: DC
  Administered 2024-01-30: 52.5 mg via SUBCUTANEOUS
  Filled 2024-01-30: qty 0.6

## 2024-01-30 MED ORDER — ACETAMINOPHEN 325 MG PO TABS: 650.0000 mg | ORAL_TABLET | Freq: Four times a day (QID) | ORAL | Status: DC | PRN

## 2024-01-30 MED ORDER — ATORVASTATIN CALCIUM 20 MG PO TABS
80.0000 mg | ORAL_TABLET | Freq: Every day | ORAL | Status: DC
  Administered 2024-01-30 – 2024-01-31 (×2): 80 mg via ORAL
  Filled 2024-01-30 (×2): qty 4

## 2024-01-30 MED ORDER — GABAPENTIN 300 MG PO CAPS
600.0000 mg | ORAL_CAPSULE | Freq: Every day | ORAL | Status: DC
  Administered 2024-01-30: 600 mg via ORAL
  Filled 2024-01-30: qty 2

## 2024-01-30 MED ORDER — ACETAMINOPHEN 650 MG RE SUPP: 650.0000 mg | Freq: Four times a day (QID) | RECTAL | Status: DC | PRN

## 2024-01-30 MED ORDER — HYDROCODONE-ACETAMINOPHEN 5-325 MG PO TABS: 1.0000 | ORAL_TABLET | ORAL | Status: DC | PRN

## 2024-01-30 MED ORDER — ONDANSETRON HCL 4 MG PO TABS: 4.0000 mg | ORAL_TABLET | Freq: Four times a day (QID) | ORAL | Status: DC | PRN

## 2024-01-30 MED ORDER — ENOXAPARIN SODIUM 40 MG/0.4ML IJ SOSY: 40.0000 mg | PREFILLED_SYRINGE | INTRAMUSCULAR | Status: DC

## 2024-01-30 MED ORDER — AMLODIPINE BESYLATE 10 MG PO TABS
10.0000 mg | ORAL_TABLET | Freq: Every day | ORAL | Status: DC
Start: 2024-01-30 — End: 2024-01-31
  Administered 2024-01-30 – 2024-01-31 (×2): 10 mg via ORAL
  Filled 2024-01-30: qty 2
  Filled 2024-01-30: qty 1

## 2024-01-30 MED ORDER — ACETAMINOPHEN 500 MG PO TABS
1000.0000 mg | ORAL_TABLET | Freq: Once | ORAL | Status: AC
  Administered 2024-01-30: 1000 mg via ORAL
  Filled 2024-01-30: qty 2

## 2024-01-30 MED ORDER — ONDANSETRON HCL 4 MG/2ML IJ SOLN: 4.0000 mg | Freq: Four times a day (QID) | INTRAMUSCULAR | Status: DC | PRN

## 2024-01-30 MED ORDER — MENTHOL 3 MG MT LOZG: 1.0000 | LOZENGE | OROMUCOSAL | Status: DC | PRN

## 2024-01-30 MED ORDER — LOSARTAN POTASSIUM 50 MG PO TABS
100.0000 mg | ORAL_TABLET | Freq: Every day | ORAL | Status: DC
  Administered 2024-01-30 – 2024-01-31 (×2): 100 mg via ORAL
  Filled 2024-01-30 (×2): qty 2

## 2024-01-30 MED ORDER — ASPIRIN 81 MG PO TBEC
81.0000 mg | DELAYED_RELEASE_TABLET | Freq: Every day | ORAL | Status: DC
  Administered 2024-01-30 – 2024-01-31 (×2): 81 mg via ORAL
  Filled 2024-01-30 (×2): qty 1

## 2024-01-30 MED ORDER — CLOPIDOGREL BISULFATE 75 MG PO TABS
75.0000 mg | ORAL_TABLET | Freq: Every day | ORAL | Status: DC
Start: 2024-01-30 — End: 2024-01-31
  Administered 2024-01-30 – 2024-01-31 (×2): 75 mg via ORAL
  Filled 2024-01-30 (×2): qty 1

## 2024-01-30 MED ORDER — HYDROCOD POLI-CHLORPHE POLI ER 10-8 MG/5ML PO SUER
5.0000 mL | Freq: Once | ORAL | Status: AC
  Administered 2024-01-30: 5 mL via ORAL
  Filled 2024-01-30: qty 5

## 2024-01-30 MED ORDER — BENZONATATE 100 MG PO CAPS: 200.0000 mg | ORAL_CAPSULE | Freq: Three times a day (TID) | ORAL | Status: DC | PRN

## 2024-01-30 MED ORDER — SODIUM CHLORIDE 0.9 % IV BOLUS (SEPSIS)
1000.0000 mL | Freq: Once | INTRAVENOUS | Status: AC
  Administered 2024-01-30: 1000 mL via INTRAVENOUS

## 2024-01-30 MED ORDER — ALBUTEROL SULFATE (2.5 MG/3ML) 0.083% IN NEBU: 2.5000 mg | INHALATION_SOLUTION | RESPIRATORY_TRACT | Status: DC | PRN

## 2024-01-30 NOTE — ED Notes (Signed)
 RN to bedside to introduce self to pt. Pt resting in bed in no acute distress and wife at bedside. Pt ambulated around the room with no changes in his O2 saturation.

## 2024-01-30 NOTE — ED Provider Notes (Signed)
 [facility] Provider Note    Event Date/Time   First MD Initiated Contact with Patient 01/30/24 0116     (approximate)   History   Shortness of Breath and Fever   HPI  Matthew Doyle is a 68 y.o. male with history of coronary artery disease, hypertension, hyperlipidemia who presents to the emergency department with fevers, chills, cough, body aches, generalized weakness, dizziness and nausea and vomiting.  Symptoms started on Wednesday, February 26.  No diarrhea.  Having some bilateral chest pain from coughing.  No history of asthma, COPD or CHF.  No lower extremity swelling or pain.   History provided by patient, wife.    Past Medical History:  Diagnosis Date   Arthritis    knee   Atrial fibrillation (HCC) post MI    CAD (coronary artery disease)    a. 09/2003 Cath/PCI: LAD 80% (3.0x23 Cypher DES);  b. NSTEMI 12/2012 cath patent LAD stent, included distal right PDA supplying a small territory, 99% proximal OM 2 (DES); c. 06/20/2016 Anlat STEMI/PCI: Stent to LAD, PTCA D1 & LCX; d. 01/2022 Cath: LM mild dzs, LAD 10p/m ISR, D1 80, LCX 40m, OM1 patent, RCA 20p, RPDA 99 w/ R->R collats from AM. EF 55-65%-->Med Rx.   Colon polyps    Diverticulosis    Gout    Hemorrhoids    Hyperlipidemia    Hypertension    Internal hemorrhoids without mention of complication    Myocardial infarction [facility])    Sleep apnea    ST elevation (STEMI) myocardial infarction involving left anterior descending coronary artery (HCC) 06/20/2016    Past Surgical History:  Procedure Laterality Date   BACK SURGERY  10/20/2016   Cervical fusion--Dr.Chester Tresa Res   CARDIAC CATHETERIZATION  2014   s/p stent   CARDIAC CATHETERIZATION N/A 06/20/2016   Procedure: Left Heart Cath and Coronary Angiography;  Surgeon: Tonny Bollman, MD;  Location: [facility] INVASIVE CV LAB;  Service: Cardiovascular;  Laterality: N/A;   CARDIAC CATHETERIZATION N/A 06/20/2016   Procedure: Coronary Stent  Intervention;  Surgeon: Tonny Bollman, MD;  Location: [facility] INVASIVE CV LAB;  Service: Cardiovascular;  Laterality: N/A;   CHOLECYSTECTOMY N/A 03/13/2016   Procedure: LAPAROSCOPIC CHOLECYSTECTOMY WITH INTRAOPERATIVE CHOLANGIOGRAM;  Surgeon: Kieth Brightly, MD;  Location: [facility] ORS;  Service: General;  Laterality: N/A;   CHOLECYSTECTOMY, LAPAROSCOPIC  03/13/2016   Infection - lengthy diagnosis d/t lack of gallstones   COLONOSCOPY  10/2013   CORONARY ANGIOPLASTY     STENTS   Cypher Stent  09/2003   mid LAD- gupta   LEFT HEART CATH AND CORONARY ANGIOGRAPHY Left 02/28/2022   Procedure: LEFT HEART CATH AND CORONARY ANGIOGRAPHY;  Surgeon: Iran Ouch, MD;  Location: [facility] INVASIVE CV LAB;  Service: Cardiovascular;  Laterality: Left;   LEFT HEART CATHETERIZATION WITH CORONARY ANGIOGRAM N/A 12/17/2012   Procedure: LEFT HEART CATHETERIZATION WITH CORONARY ANGIOGRAM;  Surgeon: Peter M Swaziland, MD;  Location: [facility] CATH LAB;  Service: Cardiovascular;  Laterality: N/A;   MULTIPLE TOOTH EXTRACTIONS     PERCUTANEOUS CORONARY STENT INTERVENTION (PCI-S) Right 12/17/2012   Procedure: PERCUTANEOUS CORONARY STENT INTERVENTION (PCI-S);  Surgeon: Peter M Swaziland, MD;  Location: [facility] CATH LAB;  Service: Cardiovascular;  Laterality: Right;    MEDICATIONS:  Prior to Admission medications   Medication Sig Start Date End Date Taking? Authorizing Provider  amLODipine (NORVASC) 10 MG tablet Take 1 tablet (10 mg total) by mouth daily. 05/13/23   Iran Ouch, MD  aspirin 81 MG tablet  Take 1 tablet (81 mg total) by mouth daily. 12/18/12   Barrett, Joline Salt, PA-C  atorvastatin (LIPITOR) 80 MG tablet Take 1 tablet (80 mg total) by mouth daily. 05/13/23   Iran Ouch, MD  clopidogrel (PLAVIX) 75 MG tablet Take 1 tablet (75 mg total) by mouth daily. 05/13/23   Iran Ouch, MD  gabapentin (NEURONTIN) 300 MG capsule Take 1-2 capsules (300-600 mg total) by mouth at bedtime. 08/31/23   Karie Schwalbe, MD   losartan (COZAAR) 100 MG tablet Take 1 tablet (100 mg total) by mouth daily. 05/13/23   Iran Ouch, MD  meclizine (ANTIVERT) 12.5 MG tablet Take 1 tablet (12.5 mg total) by mouth 3 (three) times daily as needed for dizziness. 08/24/20   Mickie Bail, NP  nitroGLYCERIN (NITROSTAT) 0.4 MG SL tablet Place 1 tablet (0.4 mg total) under the tongue every 5 (five) minutes as needed for chest pain. 02/27/22   Iran Ouch, MD    Physical Exam   Triage Vital Signs: ED Triage Vitals  Encounter Vitals Group     BP 01/29/24 2028 114/81     Systolic BP Percentile --      Diastolic BP Percentile --      Pulse Rate 01/29/24 2028 66     Resp 01/29/24 2028 16     Temp 01/29/24 2028 98.6 F (37 C)     Temp Source 01/29/24 2028 Oral     SpO2 01/29/24 2028 97 %     Weight 01/29/24 2034 235 lb (106.6 kg)     Height 01/29/24 2034 5\' 11"  (1.803 m)     Head Circumference --      Peak Flow --      Pain Score 01/29/24 2029 0     Pain Loc --      Pain Education --      Exclude from Growth Chart --     Most recent vital signs: Vitals:   01/30/24 0335 01/30/24 0336  BP: 121/77   Pulse: 68   Resp: 11   Temp:  99.7 F (37.6 C)  SpO2: 92%     CONSTITUTIONAL: Alert, responds appropriately to questions.  Appears uncomfortable but nontoxic HEAD: Normocephalic, atraumatic EYES: Conjunctivae clear, pupils appear equal, sclera nonicteric ENT: normal nose; moist mucous membranes NECK: Supple, normal ROM CARD: RRR; S1 and S2 appreciated RESP: Normal chest excursion without splinting or tachypnea; breath sounds clear and equal bilaterally; no wheezes, no rhonchi, no rales, no hypoxia or respiratory distress, speaking full sentences, productive cough ABD/GI: Non-distended; soft, non-tender, no rebound, no guarding, no peritoneal signs BACK: The back appears normal EXT: Normal ROM in all joints; no deformity noted, no edema, no calf tenderness or calf swelling SKIN: Normal color for age and race;  warm; no rash on exposed skin NEURO: Moves all extremities equally, normal speech PSYCH: The patient's mood and manner are appropriate.   ED Results / Procedures / Treatments   LABS: (all labs ordered are listed, but only abnormal results are displayed) Labs Reviewed  RESP PANEL BY RT-PCR (RSV, FLU A&B, COVID)  RVPGX2 - Abnormal; Notable for the following components:      Result Value   Influenza A by PCR POSITIVE (*)    All other components within normal limits  COMPREHENSIVE METABOLIC PANEL - Abnormal; Notable for the following components:   Glucose, Bld 123 (*)    Calcium 8.7 (*)    All other components within normal limits  URINALYSIS, W/  REFLEX TO CULTURE (INFECTION SUSPECTED) - Abnormal; Notable for the following components:   Color, Urine YELLOW (*)    APPearance CLEAR (*)    Ketones, ur 5 (*)    All other components within normal limits  CBC WITH DIFFERENTIAL/PLATELET  TROPONIN I (HIGH SENSITIVITY)     EKG:  EKG Interpretation Date/Time:  Friday January 29 2024 20:52:21 EST Ventricular Rate:  62 PR Interval:  168 QRS Duration:  78 QT Interval:  404 QTC Calculation: 410 R Axis:   -14  Text Interpretation: Normal sinus rhythm Septal infarct , age undetermined Abnormal ECG When compared with ECG of 22-Jun-2016 06:54, Sinus rhythm has replaced Atrial fibrillation Septal infarct is now Present Non-specific change in ST segment in Lateral leads Inverted T waves have replaced nonspecific T wave abnormality in Inferior leads QT has shortened Confirmed by Rochele Raring [phone]) on 01/30/2024 1:29:51 AM         RADIOLOGY: My personal review and interpretation of imaging: Chest x-ray clear.  I have personally reviewed all radiology reports.   DG Chest 2 View Result Date: 01/29/2024 CLINICAL DATA:  Shortness of breath EXAM: CHEST - 2 VIEW COMPARISON:  06/22/2016 FINDINGS: The heart size and mediastinal contours are within normal limits. Both lungs are clear. The visualized  skeletal structures show postsurgical changes in the cervical spine. IMPRESSION: No acute abnormality noted. Electronically Signed   By: Alcide Clever M.D.   On: 01/29/2024 21:03     PROCEDURES:  Critical Care performed: Yes, see critical care procedure note(s)   CRITICAL CARE Performed by: Baxter Hire Kaydee Magel   Total critical care time: 40 minutes  Critical care time was exclusive of separately billable procedures and treating other patients.  Critical care was necessary to treat or prevent imminent or life-threatening deterioration.  Critical care was time spent personally by me on the following activities: development of treatment plan with patient and/or surrogate as well as nursing, discussions with consultants, evaluation of patient's response to treatment, examination of patient, obtaining history from patient or surrogate, ordering and performing treatments and interventions, ordering and review of laboratory studies, ordering and review of radiographic studies, pulse oximetry and re-evaluation of patient's condition.   Marland Kitchen1-3 Lead EKG Interpretation  Performed by: Garvey Westcott, Layla Maw, DO Authorized by: Aylinn Rydberg, Layla Maw, DO     Interpretation: normal     ECG rate:  68   ECG rate assessment: normal     Rhythm: sinus rhythm     Ectopy: none     Conduction: normal       IMPRESSION / MDM / ASSESSMENT AND PLAN / ED COURSE  I reviewed the triage vital signs and the nursing notes.    Patient here with cough, congestion, shortness of breath with exertion.  The patient is on the cardiac monitor to evaluate for evidence of arrhythmia and/or significant heart rate changes.   DIFFERENTIAL DIAGNOSIS (includes but not limited to):   Viral illness, pneumonia, sepsis, PE, pneumothorax, CHF   Patient's presentation is most consistent with acute presentation with potential threat to life or bodily function.   PLAN: Workup initiated from triage.  Patient is positive for influenza A.  Normal  hemoglobin, electrolytes, renal function, LFTs.  Will add on troponin.  EKG nonischemic.  Chest x-ray reviewed and interpreted by myself and the radiologist and is clear.  He has no history of asthma, COPD or CHF.  Lungs are currently clear to auscultation but sats are running around 91 to 92% on room air sitting  upright in the bed talking.  He is not in distress but will closely monitor.  Will obtain urine sample as well.   MEDICATIONS GIVEN IN ED: Medications  albuterol (PROVENTIL) (2.5 MG/3ML) 0.083% nebulizer solution 2.5 mg (has no administration in time range)  acetaminophen (TYLENOL) tablet 650 mg (has no administration in time range)    Or  acetaminophen (TYLENOL) suppository 650 mg (has no administration in time range)  ondansetron (ZOFRAN) tablet 4 mg (has no administration in time range)    Or  ondansetron (ZOFRAN) injection 4 mg (has no administration in time range)  morphine (PF) 2 MG/ML injection 2 mg (has no administration in time range)  HYDROcodone-acetaminophen (NORCO/VICODIN) 5-325 MG per tablet 1-2 tablet (has no administration in time range)  guaiFENesin-dextromethorphan (ROBITUSSIN DM) 100-10 MG/5ML syrup 10 mL (has no administration in time range)  oxymetazoline (AFRIN) 0.05 % nasal spray 1 spray (has no administration in time range)  acetaminophen (TYLENOL) tablet 1,000 mg (1,000 mg Oral Given 01/30/24 0137)  ondansetron (ZOFRAN) injection 4 mg (4 mg Intravenous Given 01/30/24 0144)  sodium chloride 0.9 % bolus 1,000 mL (0 mLs Intravenous Stopped 01/30/24 0255)  chlorpheniramine-HYDROcodone (TUSSIONEX) 10-8 MG/5ML suspension 5 mL (5 mLs Oral Given 01/30/24 0137)     ED COURSE: Urine shows no sign of infection.  Troponin negative.  Patient given IV fluids, Zofran, Tussionex for symptomatic relief and states he is feeling better however while I was in the room talking to him and his wife his sats dropped to 87% on room air with good waveform and stayed there for approximately 1  minute.  He does not wear oxygen chronically.  I put him on 2 L nasal cannula and he improved to 94%.  I feel he will need to be admitted to the hospital due to his new oxygen requirement.  Patient and wife are comfortable with this plan.   CONSULTS:  Consulted and discussed patient's case with hospitalist, Dr. Para March.  I have recommended admission and consulting physician agrees and will place admission orders.  Patient (and family if present) agree with this plan.   I reviewed all nursing notes, vitals, pertinent previous records.  All labs, EKGs, imaging ordered have been independently reviewed and interpreted by myself.    OUTSIDE RECORDS REVIEWED: Reviewed last PCP note on 08/31/2023.       FINAL CLINICAL IMPRESSION(S) / ED DIAGNOSES   Final diagnoses:  Influenza A  Acute respiratory failure with hypoxia (HCC)     Rx / DC Orders   ED Discharge Orders     None        Note:  This document was prepared using Dragon voice recognition software and may include unintentional dictation errors.   Kollin Udell, Layla Maw, DO 01/30/24 [phone]

## 2024-01-30 NOTE — ED Notes (Signed)
 Increased to 3L [place] due to sats 90-91% on 2L [place]

## 2024-01-30 NOTE — Plan of Care (Signed)
 Pt recently admitted for positive Flu A, pt on Droplet precautions. Wife is at bedside. Pt placed on room and SPO2 is 98%. Pt does have noted intermittent cough.  Problem: Education: Goal: Knowledge of General Education information will improve Description: Including pain rating scale, medication(s)/side effects and non-pharmacologic comfort measures Outcome: Progressing   Problem: Health Behavior/Discharge Planning: Goal: Ability to manage health-related needs will improve Outcome: Progressing   Problem: Clinical Measurements: Goal: Ability to maintain clinical measurements within normal limits will improve Outcome: Progressing Goal: Will remain free from infection Outcome: Progressing Goal: Diagnostic test results will improve Outcome: Progressing Goal: Respiratory complications will improve Outcome: Progressing Goal: Cardiovascular complication will be avoided Outcome: Progressing   Problem: Activity: Goal: Risk for activity intolerance will decrease Outcome: Progressing   Problem: Nutrition: Goal: Adequate nutrition will be maintained Outcome: Progressing   Problem: Coping: Goal: Level of anxiety will decrease Outcome: Progressing   Problem: Elimination: Goal: Will not experience complications related to bowel motility Outcome: Progressing Goal: Will not experience complications related to urinary retention Outcome: Progressing   Problem: Pain Managment: Goal: General experience of comfort will improve and/or be controlled Outcome: Progressing   Problem: Safety: Goal: Ability to remain free from injury will improve Outcome: Progressing   Problem: Skin Integrity: Goal: Risk for impaired skin integrity will decrease Outcome: Progressing

## 2024-01-30 NOTE — H&P (Addendum)
 History and Physical    Patient: Matthew Doyle WJX:[id number] DOB: 1955/12/24 DOA: 01/30/2024 DOS: the patient was seen and examined on 01/30/2024 PCP: Karie Schwalbe, MD  Patient coming from: Home  Chief Complaint:  Chief Complaint  Patient presents with   Shortness of Breath   Fever   HPI: NASIF BOS is a 68 y.o. male with medical history significant of hypertension, hyperlipidemia, coronary artery disease s/p DES to the LAD in 2004 and PCI + DES to OM2 in 2014, bradycardia, hearing loss of right ear, IBS, and chronic neuropathic pain.  He presents to [facility] ED with fevers, chills, cough, body aches, generalized weakness, dizziness, nausea and vomiting. He reports symptoms initially began earlier in the week and worsened Wednesday. Family members exhibiting URI symptoms over the last 2 weeks as well.  Last night he reported he was unable to take deep breaths and with his cardiac history decided to call EMS.  Endorses bilateral chest pain only with coughing.  Reports that he has not had any nausea or vomiting until he was actually in the ED at which time he had one episode of nonbilious, nonbloody emesis that has since been controlled with Zofran.  He denies palpitations, headache, or diarrhea.  ED Course: On arrival to [facility] ED patient was noted to be afebrile, temp 37 C, BP 114/81, HR 66, RR 16, SpO2 97% on room air.  Later become hypoxic with SpO2 86% requiring 3 L O2 via nasal cannula for SpO2 greater than 92%.labs notable for Respiratory pathogen panel positive for influenza A, glucose 123, no leukocytosis, and negative Troponin. CXR obtained and shows no active disease. He was given 1L NS bolus, afrin, zofran, tussionex, and tylenol. TRH contacted for admission.   Review of Systems: As mentioned in the history of present illness. All other systems reviewed and are negative. Past Medical History:  Diagnosis Date   Arthritis    knee   Atrial fibrillation  (HCC) post MI    CAD (coronary artery disease)    a. 09/2003 Cath/PCI: LAD 80% (3.0x23 Cypher DES);  b. NSTEMI 12/2012 cath patent LAD stent, included distal right PDA supplying a small territory, 99% proximal OM 2 (DES); c. 06/20/2016 Anlat STEMI/PCI: Stent to LAD, PTCA D1 & LCX; d. 01/2022 Cath: LM mild dzs, LAD 10p/m ISR, D1 80, LCX 51m, OM1 patent, RCA 20p, RPDA 99 w/ R->R collats from AM. EF 55-65%-->Med Rx.   Colon polyps    Diverticulosis    Gout    Hemorrhoids    Hyperlipidemia    Hypertension    Internal hemorrhoids without mention of complication    Myocardial infarction [facility])    Sleep apnea    ST elevation (STEMI) myocardial infarction involving left anterior descending coronary artery (HCC) 06/20/2016   Past Surgical History:  Procedure Laterality Date   BACK SURGERY  10/20/2016   Cervical fusion--Dr.Chester Tresa Res   CARDIAC CATHETERIZATION  2014   s/p stent   CARDIAC CATHETERIZATION N/A 06/20/2016   Procedure: Left Heart Cath and Coronary Angiography;  Surgeon: Tonny Bollman, MD;  Location: [facility] INVASIVE CV LAB;  Service: Cardiovascular;  Laterality: N/A;   CARDIAC CATHETERIZATION N/A 06/20/2016   Procedure: Coronary Stent Intervention;  Surgeon: Tonny Bollman, MD;  Location: [facility] INVASIVE CV LAB;  Service: Cardiovascular;  Laterality: N/A;   CHOLECYSTECTOMY N/A 03/13/2016   Procedure: LAPAROSCOPIC CHOLECYSTECTOMY WITH INTRAOPERATIVE CHOLANGIOGRAM;  Surgeon: Kieth Brightly, MD;  Location: [facility] ORS;  Service: General;  Laterality: N/A;  CHOLECYSTECTOMY, LAPAROSCOPIC  03/13/2016   Infection - lengthy diagnosis d/t lack of gallstones   COLONOSCOPY  10/2013   CORONARY ANGIOPLASTY     STENTS   Cypher Stent  09/2003   mid LAD- gupta   LEFT HEART CATH AND CORONARY ANGIOGRAPHY Left 02/28/2022   Procedure: LEFT HEART CATH AND CORONARY ANGIOGRAPHY;  Surgeon: Iran Ouch, MD;  Location: [facility] INVASIVE CV LAB;  Service: Cardiovascular;  Laterality: Left;   LEFT HEART  CATHETERIZATION WITH CORONARY ANGIOGRAM N/A 12/17/2012   Procedure: LEFT HEART CATHETERIZATION WITH CORONARY ANGIOGRAM;  Surgeon: Peter M Swaziland, MD;  Location: [facility] CATH LAB;  Service: Cardiovascular;  Laterality: N/A;   MULTIPLE TOOTH EXTRACTIONS     PERCUTANEOUS CORONARY STENT INTERVENTION (PCI-S) Right 12/17/2012   Procedure: PERCUTANEOUS CORONARY STENT INTERVENTION (PCI-S);  Surgeon: Peter M Swaziland, MD;  Location: [facility] CATH LAB;  Service: Cardiovascular;  Laterality: Right;   Social History:  reports that he has never smoked. He has been exposed to tobacco smoke. He has never used smokeless tobacco. He reports that he does not drink alcohol and does not use drugs.  Allergies  Allergen Reactions   Lisinopril Cough    REACTION: cough with this and/or benazepril    Family History  Problem Relation Age of Onset   Diabetes Father    Coronary artery disease Father        CABG - 5 vessel 15 yr ago   Heart attack Paternal Uncle        Age 109   Heart attack Maternal Uncle        Age 39   Coronary artery disease Maternal Grandfather    Heart attack Maternal Grandfather        Died in 62s   Throat cancer Maternal Grandfather    Hypertension Mother    Alzheimer's disease Maternal Grandmother    Hypertension Brother    Colon cancer Neg Hx    Esophageal cancer Neg Hx    Rectal cancer Neg Hx    Stomach cancer Neg Hx     Prior to Admission medications   Medication Sig Start Date End Date Taking? Authorizing Provider  amLODipine (NORVASC) 10 MG tablet Take 1 tablet (10 mg total) by mouth daily. 05/13/23   Iran Ouch, MD  aspirin 81 MG tablet Take 1 tablet (81 mg total) by mouth daily. 12/18/12   Barrett, Joline Salt, PA-C  atorvastatin (LIPITOR) 80 MG tablet Take 1 tablet (80 mg total) by mouth daily. 05/13/23   Iran Ouch, MD  clopidogrel (PLAVIX) 75 MG tablet Take 1 tablet (75 mg total) by mouth daily. 05/13/23   Iran Ouch, MD  gabapentin (NEURONTIN) 300 MG capsule Take 1-2  capsules (300-600 mg total) by mouth at bedtime. 08/31/23   Karie Schwalbe, MD  losartan (COZAAR) 100 MG tablet Take 1 tablet (100 mg total) by mouth daily. 05/13/23   Iran Ouch, MD  meclizine (ANTIVERT) 12.5 MG tablet Take 1 tablet (12.5 mg total) by mouth 3 (three) times daily as needed for dizziness. 08/24/20   Mickie Bail, NP  nitroGLYCERIN (NITROSTAT) 0.4 MG SL tablet Place 1 tablet (0.4 mg total) under the tongue every 5 (five) minutes as needed for chest pain. 02/27/22   Iran Ouch, MD    Physical Exam: Vitals:   01/30/24 0700 01/30/24 0730 01/30/24 0800 01/30/24 0811  BP: 109/71 103/70 112/76   Pulse: (!) 56 (!) 52 (!) 58   Resp: 15 17 18  Temp:    98.4 F (36.9 C)  TempSrc:    Oral  SpO2: 100% 93% 97%   Weight:      Height:       Constitutional: NAD, calm, comfortable, fatigued Eyes: PERRL, lids and conjunctivae normal ENMT: Mucous membranes are moist. Posterior pharynx clear of any exudate or lesions. Neck: normal, supple, no masses, no thyromegaly Respiratory: clear to auscultation bilaterally, no wheezing, no crackles. Normal respiratory effort. No accessory muscle use.  Cardiovascular: Regular rate and rhythm, no murmurs / rubs / gallops. No extremity edema. 2+ radial and pedal pulses.   Abdomen: bilateral upper quadrant abdominal tenderness with coughing, no masses palpated. No hepatosplenomegaly. Bowel sounds positive x4 quadrants.  Musculoskeletal: no clubbing / cyanosis. No joint deformity upper or lower extremities. Good ROM, no contractures. Normal muscle tone.  Skin: Warm and dry. No rashes, lesions, ulcers.  Neurologic: CN 2-12 grossly intact. Sensation intact. Alert and oriented x 3. Psychiatric: Normal judgment and insight. Normal mood.   Data Reviewed: CBC    Component Value Date/Time   WBC 7.1 01/29/2024 2049   RBC 4.97 01/29/2024 2049   HGB 14.5 01/29/2024 2049   HCT 43.7 01/29/2024 2049   PLT 160 01/29/2024 2049   MCV 87.9 01/29/2024  2049   MCH 29.2 01/29/2024 2049   MCHC 33.2 01/29/2024 2049   RDW 14.1 01/29/2024 2049   LYMPHSABS 1.2 01/29/2024 2049   MONOABS 1.0 01/29/2024 2049   EOSABS 0.2 01/29/2024 2049   BASOSABS 0.0 01/29/2024 2049   CMP     Component Value Date/Time   NA 139 01/29/2024 2049   K 3.6 01/29/2024 2049   CL 104 01/29/2024 2049   CO2 24 01/29/2024 2049   GLUCOSE 123 (H) 01/29/2024 2049   BUN 15 01/29/2024 2049   CREATININE 1.00 01/29/2024 2049   CALCIUM 8.7 (L) 01/29/2024 2049   PROT 7.6 01/29/2024 2049   PROT 7.1 03/31/2013 0914   ALBUMIN 3.9 01/29/2024 2049   ALBUMIN 4.4 03/31/2013 0914   AST 24 01/29/2024 2049   ALT 23 01/29/2024 2049   ALKPHOS 53 01/29/2024 2049   BILITOT 0.9 01/29/2024 2049   GFR 76.72 08/31/2023 1231   GFRNONAA >60 01/29/2024 2049   Cardiac Panel (last 3 results) Recent Labs    01/29/24 2049  TROPONINIHS 15   Urinalysis    Component Value Date/Time   COLORURINE YELLOW (A) 01/29/2024 2049   APPEARANCEUR CLEAR (A) 01/29/2024 2049   LABSPEC 1.020 01/29/2024 2049   PHURINE 5.0 01/29/2024 2049   GLUCOSEU NEGATIVE 01/29/2024 2049   HGBUR NEGATIVE 01/29/2024 2049   BILIRUBINUR NEGATIVE 01/29/2024 2049   KETONESUR 5 (A) 01/29/2024 2049   PROTEINUR NEGATIVE 01/29/2024 2049   NITRITE NEGATIVE 01/29/2024 2049   LEUKOCYTESUR NEGATIVE 01/29/2024 2049    Results for orders placed or performed during the hospital encounter of 01/30/24  Resp panel by RT-PCR (RSV, Flu A&B, Covid) Anterior Nasal Swab     Status: Abnormal   Collection Time: 01/29/24  8:49 PM   Specimen: Anterior Nasal Swab  Result Value Ref Range Status   SARS Coronavirus 2 by RT PCR NEGATIVE NEGATIVE Final    Comment: (NOTE) SARS-CoV-2 target nucleic acids are NOT DETECTED.  The SARS-CoV-2 RNA is generally detectable in upper respiratory specimens during the acute phase of infection. The lowest concentration of SARS-CoV-2 viral copies this assay can detect is 138 copies/mL. A negative result  does not preclude SARS-Cov-2 infection and should not be used as the sole basis  for treatment or other patient management decisions. A negative result may occur with  improper specimen collection/handling, submission of specimen other than nasopharyngeal swab, presence of viral mutation(s) within the areas targeted by this assay, and inadequate number of viral copies(<138 copies/mL). A negative result must be combined with clinical observations, patient history, and epidemiological information. The expected result is Negative.  Fact Sheet for Patients:  BloggerCourse.com  Fact Sheet for Healthcare Providers:  SeriousBroker.it  This test is no t yet approved or cleared by the [place] FDA and  has been authorized for detection and/or diagnosis of SARS-CoV-2 by FDA under an Emergency Use Authorization (EUA). This EUA will remain  in effect (meaning this test can be used) for the duration of the COVID-19 declaration under Section 564(b)(1) of the Act, 21 U.S.C.section 360bbb-3(b)(1), unless the authorization is terminated  or revoked sooner.       Influenza A by PCR POSITIVE (A) NEGATIVE Final   Influenza B by PCR NEGATIVE NEGATIVE Final    Comment: (NOTE) The Xpert Xpress SARS-CoV-2/FLU/RSV plus assay is intended as an aid in the diagnosis of influenza from Nasopharyngeal swab specimens and should not be used as a sole basis for treatment. Nasal washings and aspirates are unacceptable for Xpert Xpress SARS-CoV-2/FLU/RSV testing.  Fact Sheet for Patients: BloggerCourse.com  Fact Sheet for Healthcare Providers: SeriousBroker.it  This test is not yet approved or cleared by the [place] FDA and has been authorized for detection and/or diagnosis of SARS-CoV-2 by FDA under an Emergency Use Authorization (EUA). This EUA will remain in effect (meaning this test can be used)  for the duration of the COVID-19 declaration under Section 564(b)(1) of the Act, 21 U.S.C. section 360bbb-3(b)(1), unless the authorization is terminated or revoked.     Resp Syncytial Virus by PCR NEGATIVE NEGATIVE Final    Comment: (NOTE) Fact Sheet for Patients: BloggerCourse.com  Fact Sheet for Healthcare Providers: SeriousBroker.it  This test is not yet approved or cleared by the [place] FDA and has been authorized for detection and/or diagnosis of SARS-CoV-2 by FDA under an Emergency Use Authorization (EUA). This EUA will remain in effect (meaning this test can be used) for the duration of the COVID-19 declaration under Section 564(b)(1) of the Act, 21 U.S.C. section 360bbb-3(b)(1), unless the authorization is terminated or revoked.  Performed at [facility], [address] [place], [place] [address]    DG Chest 2 View Result Date: 01/29/2024 CLINICAL DATA:  Shortness of breath EXAM: CHEST - 2 VIEW COMPARISON:  06/22/2016 FINDINGS: The heart size and mediastinal contours are within normal limits. Both lungs are clear. The visualized skeletal structures show postsurgical changes in the cervical spine. IMPRESSION: No acute abnormality noted. Electronically Signed   By: Alcide Clever M.D.   On: 01/29/2024 21:03       Assessment and Plan: #Acute Hypoxic Respiratory Failure #Influenza A Outside of window for Tamiflu, symptoms began >48 hours ago. - Supportive care: Mucinex, tessalon pearls, PRN nebulizers - Supplemental O2 as needed - Pulmonary toilet: Mobilize/incentive spirometry/flutter valve   #Hypertension - Continue home amlodipine and losartan  #Hyperlipidemia - Continue home atorvastatin  #Coronary Artery Disease - Continue home aspirin and plavix  #Chronic Neuropathic Pain - Continue home gabapentin  VTE prophylaxis: Lovenox GI prophylaxis: Not indicated  Diet: Heart Healthy Access:  PIV Lines: NONE Code Status: FULL Telemetry: No Disposition: Observe on Med-Surg   Advance Care Planning:   Code Status: Full Code   Consults: N/A  Family Communication:  No family at bedside, wife just left to go home to sleep. Mr Lashomb requested I do not call and wake her up for update.  Severity of Illness: The appropriate patient status for this patient is OBSERVATION. Observation status is judged to be reasonable and necessary in order to provide the required intensity of service to ensure the patient's safety. The patient's presenting symptoms, physical exam findings, and initial radiographic and laboratory data in the context of their medical condition is felt to place them at decreased risk for further clinical deterioration. Furthermore, it is anticipated that the patient will be medically stable for discharge from the hospital within 2 midnights of admission.   To reach the provider On-Call:   7AM- 7PM see care teams to locate the attending and reach out to them via [email or web address] Password: [email or web address] 7PM-7AM contact night-coverage If you still have difficulty reaching the appropriate provider, please page the [facility] (Director on Call) for [facility] on amion for assistance  This document was prepared using Conservation officer, historic buildings and may include unintentional dictation errors.  Bishop Limbo FNP-BC, PMHNP-BC Nurse Practitioner [facility]

## 2024-01-30 NOTE — Progress Notes (Signed)
 PHARMACIST - PHYSICIAN COMMUNICATION  CONCERNING:  Enoxaparin (Lovenox) for DVT Prophylaxis    RECOMMENDATION: Patient was prescribed enoxaprin 40mg  q24 hours for VTE prophylaxis.   Filed Weights   01/29/24 2034  Weight: 106.6 kg (235 lb)    Body mass index is 32.78 kg/m.  Estimated Creatinine Clearance: 87.8 mL/min (by C-G formula based on SCr of 1 mg/dL).   Based on [facility] policy patient is candidate for enoxaparin 0.5mg /kg TBW SQ every 24 hours based on BMI being >30.  DESCRIPTION: Pharmacy has adjusted enoxaparin dose per [facility] policy.  Patient is now receiving enoxaparin 52.5 mg every 24 hours    Foye Deer, PharmD Clinical Pharmacist  01/30/2024 7:45 AM

## 2024-01-31 DIAGNOSIS — J101 Influenza due to other identified influenza virus with other respiratory manifestations: Secondary | ICD-10-CM | POA: Diagnosis not present

## 2024-01-31 LAB — CBC
HCT: 39.5 % (ref 39.0–52.0)
Hemoglobin: 13.2 g/dL (ref 13.0–17.0)
MCH: 28.6 pg (ref 26.0–34.0)
MCHC: 33.4 g/dL (ref 30.0–36.0)
MCV: 85.7 fL (ref 80.0–100.0)
Platelets: 154 10*3/uL (ref 150–400)
RBC: 4.61 MIL/uL (ref 4.22–5.81)
RDW: 14.1 % (ref 11.5–15.5)
WBC: 5.9 10*3/uL (ref 4.0–10.5)
nRBC: 0 % (ref 0.0–0.2)

## 2024-01-31 LAB — BASIC METABOLIC PANEL
Anion gap: 5 (ref 5–15)
BUN: 13 mg/dL (ref 8–23)
CO2: 28 mmol/L (ref 22–32)
Calcium: 8.5 mg/dL — ABNORMAL LOW (ref 8.9–10.3)
Chloride: 108 mmol/L (ref 98–111)
Creatinine, Ser: 1.04 mg/dL (ref 0.61–1.24)
GFR, Estimated: >60 mL/min (ref >60)
Glucose, Bld: 115 mg/dL — ABNORMAL HIGH (ref 70–99)
Potassium: 4 mmol/L (ref 3.5–5.1)
Sodium: 141 mmol/L (ref 135–145)

## 2024-01-31 LAB — MAGNESIUM: Magnesium: 2.2 mg/dL (ref 1.7–2.4)

## 2024-01-31 LAB — HIV ANTIBODY (ROUTINE TESTING W REFLEX): HIV Screen 4th Generation wRfx: NONREACTIVE

## 2024-01-31 MED ORDER — GUAIFENESIN-DM 100-10 MG/5ML PO SYRP: 5.0000 mL | ORAL_SOLUTION | ORAL | Status: AC | PRN

## 2024-01-31 MED ORDER — ONDANSETRON 4 MG PO TBDP: 4.0000 mg | ORAL_TABLET | Freq: Three times a day (TID) | ORAL | 1 refills | Status: DC | PRN

## 2024-01-31 NOTE — Plan of Care (Signed)

## 2024-01-31 NOTE — Discharge Summary (Signed)
 Physician Discharge Summary   Matthew Doyle  male DOB: 02-05-1956  ZOX:[id number]  PCP: Karie Schwalbe, MD  Admit date: 01/30/2024 Discharge date: 01/31/2024  Admitted From: home Disposition:  home CODE STATUS: Full code   Hospital Course:  For full details, please see H&P, progress notes, consult notes and ancillary notes.  Briefly,  Matthew Doyle is a 68 y.o. male with medical history significant of hypertension, coronary artery disease s/p DES to the LAD in 2004 and PCI + DES to OM2 in 2014, IBS, and chronic neuropathic pain.  He presented to [facility] ED with fevers, chills, cough, body aches, generalized weakness, dizziness, nausea and vomiting.   He reported symptoms initially began earlier in the week and worsened. Family members exhibiting URI symptoms over the last 2 weeks as well.   In the ED, pt became hypoxic with SpO2 86% requiring 3 L O2 via nasal cannula.  #Acute Hypoxic Respiratory Failure #Influenza A Outside of window for Tamiflu, symptoms began >48 hours ago.  Initially hypoxic in the ED, however, by next day, Saturations on Room Air while Ambulating = 98%, pt was therefore discharged.   #Hypertension - Continue home amlodipine and losartan   #Hyperlipidemia - Continue home atorvastatin   #Coronary Artery Disease - Continue home aspirin and plavix --cont home statin   #Chronic Neuropathic Pain - Continue home gabapentin   Discharge Diagnoses:  Principal Problem:   Influenza A Active Problems:   Hyperlipidemia   Essential hypertension   CAD (coronary artery disease)   Chronic neuropathic pain   Acute hypoxic respiratory failure [facility])     Discharge Instructions:  Allergies as of 01/31/2024       Reactions   Lisinopril Cough   REACTION: cough with this and/or benazepril        Medication List     TAKE these medications    amLODipine 10 MG tablet Commonly known as: NORVASC Take 1 tablet (10 mg total) by mouth  daily.   aspirin 81 MG tablet Take 1 tablet (81 mg total) by mouth daily.   atorvastatin 80 MG tablet Commonly known as: LIPITOR Take 1 tablet (80 mg total) by mouth daily.   clopidogrel 75 MG tablet Commonly known as: PLAVIX Take 1 tablet (75 mg total) by mouth daily.   gabapentin 300 MG capsule Commonly known as: NEURONTIN Take 1-2 capsules (300-600 mg total) by mouth at bedtime.   guaiFENesin-dextromethorphan 100-10 MG/5ML syrup Commonly known as: ROBITUSSIN DM Take 5 mLs by mouth every 4 (four) hours as needed for up to 7 days for cough.   losartan 100 MG tablet Commonly known as: COZAAR Take 1 tablet (100 mg total) by mouth daily.   meclizine 12.5 MG tablet Commonly known as: ANTIVERT Take 1 tablet (12.5 mg total) by mouth 3 (three) times daily as needed for dizziness.   nitroGLYCERIN 0.4 MG SL tablet Commonly known as: Nitrostat Place 1 tablet (0.4 mg total) under the tongue every 5 (five) minutes as needed for chest pain.   ondansetron 4 MG disintegrating tablet Commonly known as: ZOFRAN-ODT Take 1 tablet (4 mg total) by mouth every 8 (eight) hours as needed for nausea or vomiting.         Follow-up Information     Tillman Abide I, MD Follow up in 1 week(s).   Specialties: Internal Medicine, Pediatrics Contact information: [address] [place] [address] [phone]  Allergies  Allergen Reactions   Lisinopril Cough    REACTION: cough with this and/or benazepril     The results of significant diagnostics from this hospitalization (including imaging, microbiology, ancillary and laboratory) are listed below for reference.   Consultations:   Procedures/Studies: DG Chest 2 View Result Date: 01/29/2024 CLINICAL DATA:  Shortness of breath EXAM: CHEST - 2 VIEW COMPARISON:  06/22/2016 FINDINGS: The heart size and mediastinal contours are within normal limits. Both lungs are clear. The visualized skeletal structures  show postsurgical changes in the cervical spine. IMPRESSION: No acute abnormality noted. Electronically Signed   By: Alcide Clever M.D.   On: 01/29/2024 21:03      Labs: BNP (last 3 results) No results for input(s): "BNP" in the last 8760 hours. Basic Metabolic Panel: Recent Labs  Lab 01/29/24 2049 01/31/24 0323  NA 139 141  K 3.6 4.0  CL 104 108  CO2 24 28  GLUCOSE 123* 115*  BUN 15 13  CREATININE 1.00 1.04  CALCIUM 8.7* 8.5*  MG  --  2.2   Liver Function Tests: Recent Labs  Lab 01/29/24 2049  AST 24  ALT 23  ALKPHOS 53  BILITOT 0.9  PROT 7.6  ALBUMIN 3.9   No results for input(s): "LIPASE", "AMYLASE" in the last 168 hours. No results for input(s): "AMMONIA" in the last 168 hours. CBC: Recent Labs  Lab 01/29/24 2049 01/31/24 0323  WBC 7.1 5.9  NEUTROABS 4.7  --   HGB 14.5 13.2  HCT 43.7 39.5  MCV 87.9 85.7  PLT 160 154   Cardiac Enzymes: No results for input(s): "CKTOTAL", "CKMB", "CKMBINDEX", "TROPONINI" in the last 168 hours. BNP: Invalid input(s): "POCBNP" CBG: No results for input(s): "GLUCAP" in the last 168 hours. D-Dimer No results for input(s): "DDIMER" in the last 72 hours. Hgb A1c No results for input(s): "HGBA1C" in the last 72 hours. Lipid Profile No results for input(s): "CHOL", "HDL", "LDLCALC", "TRIG", "CHOLHDL", "LDLDIRECT" in the last 72 hours. Thyroid function studies No results for input(s): "TSH", "T4TOTAL", "T3FREE", "THYROIDAB" in the last 72 hours.  Invalid input(s): "FREET3" Anemia work up No results for input(s): "VITAMINB12", "FOLATE", "FERRITIN", "TIBC", "IRON", "RETICCTPCT" in the last 72 hours. Urinalysis    Component Value Date/Time   COLORURINE YELLOW (A) 01/29/2024 2049   APPEARANCEUR CLEAR (A) 01/29/2024 2049   LABSPEC 1.020 01/29/2024 2049   PHURINE 5.0 01/29/2024 2049   GLUCOSEU NEGATIVE 01/29/2024 2049   HGBUR NEGATIVE 01/29/2024 2049   BILIRUBINUR NEGATIVE 01/29/2024 2049   KETONESUR 5 (A) 01/29/2024 2049    PROTEINUR NEGATIVE 01/29/2024 2049   NITRITE NEGATIVE 01/29/2024 2049   LEUKOCYTESUR NEGATIVE 01/29/2024 2049   Sepsis Labs Recent Labs  Lab 01/29/24 2049 01/31/24 0323  WBC 7.1 5.9   Microbiology Recent Results (from the past 240 hours)  Resp panel by RT-PCR (RSV, Flu A&B, Covid) Anterior Nasal Swab     Status: Abnormal   Collection Time: 01/29/24  8:49 PM   Specimen: Anterior Nasal Swab  Result Value Ref Range Status   SARS Coronavirus 2 by RT PCR NEGATIVE NEGATIVE Final    Comment: (NOTE) SARS-CoV-2 target nucleic acids are NOT DETECTED.  The SARS-CoV-2 RNA is generally detectable in upper respiratory specimens during the acute phase of infection. The lowest concentration of SARS-CoV-2 viral copies this assay can detect is 138 copies/mL. A negative result does not preclude SARS-Cov-2 infection and should not be used as the sole basis for treatment or other patient management decisions. A negative  result may occur with  improper specimen collection/handling, submission of specimen other than nasopharyngeal swab, presence of viral mutation(s) within the areas targeted by this assay, and inadequate number of viral copies(<138 copies/mL). A negative result must be combined with clinical observations, patient history, and epidemiological information. The expected result is Negative.  Fact Sheet for Patients:  BloggerCourse.com  Fact Sheet for Healthcare Providers:  SeriousBroker.it  This test is no t yet approved or cleared by the [place] FDA and  has been authorized for detection and/or diagnosis of SARS-CoV-2 by FDA under an Emergency Use Authorization (EUA). This EUA will remain  in effect (meaning this test can be used) for the duration of the COVID-19 declaration under Section 564(b)(1) of the Act, 21 U.S.C.section 360bbb-3(b)(1), unless the authorization is terminated  or revoked sooner.       Influenza A  by PCR POSITIVE (A) NEGATIVE Final   Influenza B by PCR NEGATIVE NEGATIVE Final    Comment: (NOTE) The Xpert Xpress SARS-CoV-2/FLU/RSV plus assay is intended as an aid in the diagnosis of influenza from Nasopharyngeal swab specimens and should not be used as a sole basis for treatment. Nasal washings and aspirates are unacceptable for Xpert Xpress SARS-CoV-2/FLU/RSV testing.  Fact Sheet for Patients: BloggerCourse.com  Fact Sheet for Healthcare Providers: SeriousBroker.it  This test is not yet approved or cleared by the [place] FDA and has been authorized for detection and/or diagnosis of SARS-CoV-2 by FDA under an Emergency Use Authorization (EUA). This EUA will remain in effect (meaning this test can be used) for the duration of the COVID-19 declaration under Section 564(b)(1) of the Act, 21 U.S.C. section 360bbb-3(b)(1), unless the authorization is terminated or revoked.     Resp Syncytial Virus by PCR NEGATIVE NEGATIVE Final    Comment: (NOTE) Fact Sheet for Patients: BloggerCourse.com  Fact Sheet for Healthcare Providers: SeriousBroker.it  This test is not yet approved or cleared by the [place] FDA and has been authorized for detection and/or diagnosis of SARS-CoV-2 by FDA under an Emergency Use Authorization (EUA). This EUA will remain in effect (meaning this test can be used) for the duration of the COVID-19 declaration under Section 564(b)(1) of the Act, 21 U.S.C. section 360bbb-3(b)(1), unless the authorization is terminated or revoked.  Performed at [facility], [address] [place], [place] [address]      Total time spend on discharging this patient, including the last patient exam, discussing the hospital stay, instructions for ongoing care as it relates to all pertinent caregivers, as well as preparing the medical discharge records,  prescriptions, and/or referrals as applicable, is 30 minutes.    Darlin Priestly, MD  [facility] 01/31/2024, 8:43 AM

## 2024-01-31 NOTE — Progress Notes (Signed)
 SATURATION QUALIFICATIONS: (This note is used to comply with regulatory documentation for home oxygen)  Patient Saturations on Room Air at Rest = 99%  Patient Saturations on Room Air while Ambulating = 98%  Patient Saturations on 0 Liters of oxygen while Ambulating = 98%  Please briefly explain why patient needs home oxygen:

## 2024-05-11 ENCOUNTER — Other Ambulatory Visit: Payer: Self-pay | Admitting: Cardiovascular Disease

## 2024-05-14 ENCOUNTER — Other Ambulatory Visit: Payer: Self-pay | Admitting: Cardiovascular Disease

## 2024-05-27 ENCOUNTER — Encounter: Payer: Self-pay | Admitting: Nurse Practitioner

## 2024-05-27 ENCOUNTER — Ambulatory Visit: Attending: Nurse Practitioner | Admitting: Nurse Practitioner

## 2024-05-27 VITALS — BP 122/72 | HR 60 | Ht 71.0 in | Wt 231.0 lb

## 2024-05-27 DIAGNOSIS — G4733 Obstructive sleep apnea (adult) (pediatric): Secondary | ICD-10-CM | POA: Diagnosis not present

## 2024-05-27 DIAGNOSIS — I2510 Atherosclerotic heart disease of native coronary artery without angina pectoris: Secondary | ICD-10-CM

## 2024-05-27 DIAGNOSIS — I10 Essential (primary) hypertension: Secondary | ICD-10-CM | POA: Diagnosis not present

## 2024-05-27 DIAGNOSIS — E785 Hyperlipidemia, unspecified: Secondary | ICD-10-CM

## 2024-05-27 MED ORDER — LOSARTAN POTASSIUM 100 MG PO TABS: 100.0000 mg | ORAL_TABLET | Freq: Every day | ORAL | 3 refills | Status: AC

## 2024-05-27 MED ORDER — ATORVASTATIN CALCIUM 80 MG PO TABS: 80.0000 mg | ORAL_TABLET | Freq: Every day | ORAL | 3 refills | Status: AC

## 2024-05-27 MED ORDER — AMLODIPINE BESYLATE 10 MG PO TABS: 10.0000 mg | ORAL_TABLET | Freq: Every day | ORAL | 3 refills | Status: AC

## 2024-05-27 NOTE — Patient Instructions (Signed)
 Medication Instructions:  No changes *If you need a refill on your cardiac medications before your next appointment, please call your pharmacy*  Lab Work: None ordered If you have labs (blood work) drawn today and your tests are completely normal, you will receive your results only by: MyChart Message (if you have MyChart) OR A paper copy in the mail If you have any lab test that is abnormal or we need to change your treatment, we will call you to review the results.  Testing/Procedures: None ordered  Follow-Up: At [facility], you and your health needs are our priority.  As part of our continuing mission to provide you with exceptional heart care, our providers are all part of one team.  This team includes your primary Cardiologist (physician) and Advanced Practice Providers or APPs (Physician Assistants and Nurse Practitioners) who all work together to provide you with the care you need, when you need it.  Your next appointment:   12 month(s)  Provider:   You may see Lorine Bears, MD or one of the following Advanced Practice Providers on your designated Care Team:   Nicolasa Ducking, NP  We recommend signing up for the patient portal called "MyChart".  Sign up information is provided on this After Visit Summary.  MyChart is used to connect with patients for Virtual Visits (Telemedicine).  Patients are able to view lab/test results, encounter notes, upcoming appointments, etc.  Non-urgent messages can be sent to your provider as well.   To learn more about what you can do with MyChart, go to [email or web address].

## 2024-05-27 NOTE — Progress Notes (Signed)
 Office Visit    Patient Name: Matthew Doyle Date of Encounter: 05/27/2024  Primary Care Provider:  Jimmy Charlie FERNS, MD Primary Cardiologist:  Matthew Cage, MD  Chief Complaint    68 y.o. male with a history of CAD status post prior LAD and OM2 stenting, hypertension, hyperlipidemia, sleep apnea, and cervical myelopathy, who presents for CAD follow-up.  Past Medical History   Subjective   Past Medical History:  Diagnosis Date   Arthritis    knee   Atrial fibrillation (HCC) post MI    CAD (coronary artery disease)    a. 09/2003 Cath/PCI: LAD 80% (3.0x23 Cypher DES);  b. NSTEMI 12/2012 cath patent LAD stent, included distal right PDA supplying a small territory, 99% proximal OM 2 (DES); c. 06/20/2016 Anlat STEMI/PCI: Stent to LAD, PTCA D1 & LCX; d. 01/2022 Cath: LM mild dzs, LAD 10p/m ISR, D1 80, LCX 22m, OM1 patent, RCA 20p, RPDA 99 w/ R->R collats from AM. EF 55-65%-->Med Rx.   Colon polyps    Diverticulosis    Gout    Hemorrhoids    Hyperlipidemia    Hypertension    Internal hemorrhoids without mention of complication    Myocardial infarction [facility])    Sleep apnea    ST elevation (STEMI) myocardial infarction involving left anterior descending coronary artery (HCC) 06/20/2016   Past Surgical History:  Procedure Laterality Date   BACK SURGERY  10/20/2016   Cervical fusion--Dr.Chester Doyle   CARDIAC CATHETERIZATION  2014   s/p stent   CARDIAC CATHETERIZATION N/A 06/20/2016   Procedure: Left Heart Cath and Coronary Angiography;  Surgeon: Matthew Fell, MD;  Location: [facility] INVASIVE CV LAB;  Service: Cardiovascular;  Laterality: N/A;   CARDIAC CATHETERIZATION N/A 06/20/2016   Procedure: Coronary Stent Intervention;  Surgeon: Matthew Fell, MD;  Location: [facility] INVASIVE CV LAB;  Service: Cardiovascular;  Laterality: N/A;   CHOLECYSTECTOMY N/A 03/13/2016   Procedure: LAPAROSCOPIC CHOLECYSTECTOMY WITH INTRAOPERATIVE CHOLANGIOGRAM;  Surgeon: Matthew KANDICE Muse, MD;   Location: [facility] ORS;  Service: General;  Laterality: N/A;   CHOLECYSTECTOMY, LAPAROSCOPIC  03/13/2016   Infection - lengthy diagnosis d/t lack of gallstones   COLONOSCOPY  10/2013   CORONARY ANGIOPLASTY     STENTS   Cypher Stent  09/2003   mid LAD- Matthew Doyle   LEFT HEART CATH AND CORONARY ANGIOGRAPHY Left 02/28/2022   Procedure: LEFT HEART CATH AND CORONARY ANGIOGRAPHY;  Surgeon: Matthew Doyle LABOR, MD;  Location: [facility] INVASIVE CV LAB;  Service: Cardiovascular;  Laterality: Left;   LEFT HEART CATHETERIZATION WITH CORONARY ANGIOGRAM N/A 12/17/2012   Procedure: LEFT HEART CATHETERIZATION WITH CORONARY ANGIOGRAM;  Surgeon: Matthew M Swaz[place]and, MD;  Location: [facility] CATH LAB;  Service: Cardiovascular;  Laterality: N/A;   MULTIPLE TOOTH EXTRACTIONS     PERCUTANEOUS CORONARY STENT INTERVENTION (PCI-S) Right 12/17/2012   Procedure: PERCUTANEOUS CORONARY STENT INTERVENTION (PCI-S);  Surgeon: Matthew M Swaziland, MD;  Location: [facility] CATH LAB;  Service: Cardiovascular;  Laterality: Right;    Allergies  Allergies  Allergen Reactions   Lisinopril Cough    REACTION: cough with this and/or benazepril       History of Present Illness      68 y.o. y/o male with above past medical history including CAD, hypertension, hyperlipidemia, sleep apnea, and cervical myelopathy. He previously underwent drug-eluting stent placement to the LAD in 2004 with non-STEMI in January 2014, requiring drug-eluting stent placement to the second obtuse marginal. In July 2017, he presented with anterolateral STEMI complicated by VF arrest. Catheterization showed  a subtotal thrombotic occlusion of the proximal LAD at the bifurcation of the diagonal, as well as thrombus in the mid left circumflex. He underwent aspiration thrombectomy of the diagonal and left circumflex and drug-eluting stent placement to the proximal LAD. EF was 45% at that time however, subsequent echo showed normal LV systolic function.   He had cervical myelopathy and underwent  cervical spine surgery in November 2017.  He has essential tremors.   In March 2023, he was seen in clinic with reports of exertional substernal chest pain, tightness, dizziness, and fatigue. He was noted to be bradycardic and his beta-blocker dose was reduced. Decision was made to perform diagnostic catheterization which showed patent LAD stents with an 80% diagonal stenosis, nonobstructive circumflex disease, and a subtotal occlusion of the RPDA with right to right collaterals from an acute marginal. Anatomy was overall stable and recommendation was made for aggressive medical therapy.     Matthew Doyle was last seen in cardiology clinic in 05/2023, at which time he was doing well.  He was admitted to [facility] in March 2025 with flu A was treated with Tamiflu.  Since then, he has done well.  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  He does not experience chest pain or dyspnea.  Further, he denies palpitations, PND, orthopnea, dizziness, syncope, edema, or early satiety. Objective   Home Medications    Current Outpatient Medications  Medication Sig Dispense Refill   aspirin  81 MG tablet Take 1 tablet (81 mg total) by mouth daily. 30 tablet    clopidogrel  (PLAVIX ) 75 MG tablet Take 1 tablet (75 mg total) by mouth daily. 90 tablet 3   gabapentin  (NEURONTIN ) 300 MG capsule Take 1-2 capsules (300-600 mg total) by mouth at bedtime. 180 capsule 3   nitroGLYCERIN  (NITROSTAT ) 0.4 MG SL tablet Place 1 tablet (0.4 mg total) under the tongue every 5 (five) minutes as needed for chest pain. 25 tablet 1   amLODipine  (NORVASC ) 10 MG tablet Take 1 tablet (10 mg total) by mouth daily. 90 tablet 3   atorvastatin  (LIPITOR ) 80 MG tablet Take 1 tablet (80 mg total) by mouth daily. 90 tablet 3   losartan  (COZAAR ) 100 MG tablet Take 1 tablet (100 mg total) by mouth daily. 90 tablet 3   meclizine  (ANTIVERT ) 12.5 MG tablet  Take 1 tablet (12.5 mg total) by mouth 3 (three) times daily as needed for dizziness. (Patient not taking: Reported on 05/27/2024) 30 tablet 0   ondansetron  (ZOFRAN -ODT) 4 MG disintegrating tablet Take 1 tablet (4 mg total) by mouth every 8 (eight) hours as needed for nausea or vomiting. (Patient not taking: Reported on 05/27/2024) 10 tablet 1   No current facility-administered medications for this visit.     Physical Exam    VS:  BP 122/72   Pulse 60   Ht 5' 11 (1.803 m)   Wt 231 lb (104.8 kg)   SpO2 96%   BMI 32.22 kg/m  , BMI Body mass index is 32.22 kg/m.          GEN: Well nourished, well developed, in no acute distress. HEENT: normal. Neck: Supple, no JVD, carotid bruits, or masses. Cardiac: RRR, no murmurs, rubs, or gallops. No clubbing, cyanosis, edema.  Radials 2+/PT 2+ and equal bilaterally.  Respiratory:  Respirations regular and unlabored, clear to auscultation bilaterally. GI: Soft, nontender, nondistended, BS + x 4. MS: no  deformity or atrophy. Skin: warm and dry, no rash. Neuro:  Strength and sensation are intact. Psych: Normal affect.  Accessory Clinical Findings    ECG from January 29, 2024 shows sinus rhythm, 67, septal infarct  Lab Results  Component Value Date   WBC 5.9 01/31/2024   HGB 13.2 01/31/2024   HCT 39.5 01/31/2024   MCV 85.7 01/31/2024   PLT 154 01/31/2024   Lab Results  Component Value Date   CREATININE 1.04 01/31/2024   BUN 13 01/31/2024   NA 141 01/31/2024   K 4.0 01/31/2024   CL 108 01/31/2024   CO2 28 01/31/2024   Lab Results  Component Value Date   ALT 23 01/29/2024   AST 24 01/29/2024   ALKPHOS 53 01/29/2024   BILITOT 0.9 01/29/2024   Lab Results  Component Value Date   CHOL 98 08/31/2023   HDL 39.90 08/31/2023   LDLCALC 40 08/31/2023   TRIG 91.0 08/31/2023   CHOLHDL 2 08/31/2023    Lab Results  Component Value Date   HGBA1C 6.3 (H) 06/20/2016   Lab Results  Component Value Date   TSH 1.443 06/20/2016        Assessment & Plan    1.  Coronary artery disease: Status post drug-eluting stent placement to the LAD in 2004 followed by non-STEMI in January 2014 with drug-eluting stent placement to the second obtuse marginal, and then STEMI in 2017 in the setting of a thrombotic occlusion of the LAD with diagonal LAD thrombus as well requiring stenting of the LAD and PTCA/thrombectomy of the circumflex and first diagonal.  He had stable anatomy on catheterization March 2023 (known 99% RPDA stenosis).  He has since done well and over the past year has been active without chest pain or dyspnea.  He remains on aspirin , statin, Plavix , calcium  channel blocker, and ARB therapy.  2.  Primary hypertension: Well-controlled today at 122/72.  He remains on losartan  and amlodipine  therapy.  3.  Hyperlipidemia: LDL of 40 last September with normal LFTs at that time.  He remains on atorvastatin  therapy.  4.  Obstructive sleep apnea: Poor tolerance to CPAP historically.  No current issues with daytime fatigue.  5.  Disposition: Follow-up in 1 year or sooner if necessary.  Lonni Meager, NP 05/27/2024, 9:43 AM

## 2024-06-07 ENCOUNTER — Other Ambulatory Visit: Payer: Self-pay | Admitting: Cardiovascular Disease

## 2024-09-07 ENCOUNTER — Ambulatory Visit

## 2024-09-15 ENCOUNTER — Other Ambulatory Visit: Payer: Self-pay | Admitting: Nurse Practitioner

## 2024-09-22 ENCOUNTER — Ambulatory Visit: Payer: Self-pay

## 2024-09-22 NOTE — Telephone Encounter (Signed)
 FYI Only or Action Required?: FYI only for provider.  Patient was last seen in primary care on 08/31/2023 by Jimmy Charlie FERNS, MD.  Called Nurse Triage reporting Dizziness.  Symptoms began today.  Interventions attempted: Rest, hydration, or home remedies.  Symptoms are: unchanged.  Triage Disposition: See PCP When Office is Open (Within 3 Days)  Patient/caregiver understands and will follow disposition?: Yes     Copied from CRM #[id number]. Topic: Clinical - Red Word Triage >> Sep 22, 2024 12:29 PM [place] A wrote: [facility] that prompted transfer to Nurse Triage: Patient's wife said patient came home early from work due to dizziness and that he does have Vertigo.     Reason for Disposition  [1] MODERATE dizziness (e.g., vertigo; feels very unsteady, interferes with normal activities) AND [2] has been evaluated by doctor (or NP/PA) for this  Answer Assessment - Initial Assessment Questions 1. DESCRIPTION: Describe your dizziness.     Room is spinning  2. VERTIGO: Do you feel like either you or the room is spinning or tilting?      Yes 3. LIGHTHEADED: Do you feel lightheaded? (e.g., somewhat faint, woozy, weak upon standing)     No 4. SEVERITY: How bad is it?  Can you walk?     Moderate  5. ONSET:  When did the dizziness begin?     Today  6. AGGRAVATING FACTORS: Does anything make it worse? (e.g., standing, change in head position)     Standing  7. CAUSE: What do you think is causing the dizziness?     History of vertigo 8. RECURRENT SYMPTOM: Have you had dizziness before? If Yes, ask: When was the last time? What happened that time?     Yes, history of vertigo  9. OTHER SYMPTOMS: Do you have any other symptoms? (e.g., earache, headache, numbness, tinnitus, vomiting, weakness)     No  Protocols used: Dizziness - Vertigo-A-AH

## 2024-09-22 NOTE — Telephone Encounter (Signed)
 Noted. Will evaluate in office

## 2024-09-23 ENCOUNTER — Encounter: Payer: Self-pay | Admitting: Nurse Practitioner

## 2024-09-23 ENCOUNTER — Ambulatory Visit: Admitting: Nurse Practitioner

## 2024-09-23 VITALS — BP 136/84 | HR 60 | Temp 98.3°F | Ht 71.0 in | Wt 234.2 lb

## 2024-09-23 DIAGNOSIS — H6121 Impacted cerumen, right ear: Secondary | ICD-10-CM

## 2024-09-23 DIAGNOSIS — Z23 Encounter for immunization: Secondary | ICD-10-CM | POA: Diagnosis not present

## 2024-09-23 DIAGNOSIS — Z87898 Personal history of other specified conditions: Secondary | ICD-10-CM

## 2024-09-23 DIAGNOSIS — R42 Dizziness and giddiness: Secondary | ICD-10-CM | POA: Diagnosis not present

## 2024-09-23 LAB — CBC
HCT: 45.7 % (ref 39.0–52.0)
Hemoglobin: 15 g/dL (ref 13.0–17.0)
MCHC: 32.8 g/dL (ref 30.0–36.0)
MCV: 85.7 fl (ref 78.0–100.0)
Platelets: 207 K/uL (ref 150.0–400.0)
RBC: 5.33 Mil/uL (ref 4.22–5.81)
RDW: 14.8 % (ref 11.5–15.5)
WBC: 7.2 K/uL (ref 4.0–10.5)

## 2024-09-23 LAB — TSH: TSH: 1.75 u[IU]/mL (ref 0.35–5.50)

## 2024-09-23 LAB — BASIC METABOLIC PANEL WITH GFR
BUN: 14 mg/dL (ref 6–23)
CO2: 28 meq/L (ref 19–32)
Calcium: 9.1 mg/dL (ref 8.4–10.5)
Chloride: 103 meq/L (ref 96–112)
Creatinine, Ser: 0.98 mg/dL (ref 0.40–1.50)
GFR: 78.95 mL/min (ref >60.00)
Glucose, Bld: 147 mg/dL — ABNORMAL HIGH (ref 70–99)
Potassium: 3.7 meq/L (ref 3.5–5.1)
Sodium: 140 meq/L (ref 135–145)

## 2024-09-23 MED ORDER — GABAPENTIN 300 MG PO CAPS: 300.0000 mg | ORAL_CAPSULE | Freq: Every day | ORAL | 1 refills | Status: AC

## 2024-09-23 NOTE — Patient Instructions (Signed)
 Nice to see you today  I will be in touch with the labs once I have reviewed the  Follow up with me in 3 month for a TOC and CPE

## 2024-09-23 NOTE — Progress Notes (Signed)
 Established Patient Office Visit  Subjective   Patient ID: Matthew Doyle, male    DOB: Oct 18, 1956  Age: 68 y.o. MRN: [id number]  Chief Complaint  Patient presents with   Dizziness   Medication Management    Has question regarding side eff of gabapentin         With a history of parkisnonism, hld, vertigo. Neuropathic pain , CAD, HTN  Discussed the use of AI scribe software for clinical note transcription with the patient, who gave verbal consent to proceed.  History of Present Illness Matthew Doyle is a 68 year old male who presents with dizziness and brain fog.  He has been experiencing dizziness since yesterday morning, described as lightheadedness rather than vertigo, accompanied by brain fog that affected his ability to concentrate at work, prompting him to leave early. The symptoms resolved by the end of the day. He has had similar episodes in the past, though not as severe recently.  He has been taking gabapentin  300 mg, initially two pills at night, for stomach issues and pain across the top of his abdomen. Recently, he reduced the dose to one pill at night due to concerns about long-term side effects. He questions whether this reduction could be related to his dizziness. He has not taken meclizine  recently, which was previously prescribed for dizziness.  His diet typically includes a breakfast biscuit, occasional lunch, and dinner at home. He drinks diet soda and water, especially on hot days. His blood pressure was checked at home and was 120/70.  He reports swelling in his hands, particularly the left, which feels tight and itchy, though he has not been exposed to any known irritants. He has a history of hand tremors, diagnosed as parkinsonianism, but not Parkinson's disease, and has been managing this condition for about eight years without significant progression.  He works part-time in a [employer] claims handler, requiring focus and concentration. No weakness, numbness, or  tingling on one side versus the other. No speech or vision changes.     Review of Systems  Constitutional:  Negative for chills and fever.  Respiratory:  Negative for shortness of breath.   Cardiovascular:  Negative for chest pain.  Neurological:  Positive for dizziness. Negative for tingling, weakness and headaches.      Objective:     BP 136/84 (BP Location: Right Arm, Patient Position: Sitting, Cuff Size: Normal)   Pulse 60   Temp 98.3 F (36.8 C) (Oral)   Ht 5' 11 (1.803 m)   Wt 234 lb 4 oz (106.3 kg)   SpO2 99%   BMI 32.67 kg/m  BP Readings from Last 3 Encounters:  09/23/24 136/84  05/27/24 122/72  01/31/24 131/81   Wt Readings from Last 3 Encounters:  09/23/24 234 lb 4 oz (106.3 kg)  05/27/24 231 lb (104.8 kg)  01/29/24 235 lb (106.6 kg)   SpO2 Readings from Last 3 Encounters:  09/23/24 99%  05/27/24 96%  01/31/24 96%      Physical Exam Vitals and nursing note reviewed.  Constitutional:      Appearance: Normal appearance.  Cardiovascular:     Rate and Rhythm: Normal rate and regular rhythm.     Heart sounds: Normal heart sounds.  Pulmonary:     Effort: Pulmonary effort is normal.     Breath sounds: Normal breath sounds.  Neurological:     General: No focal deficit present.     Mental Status: He is alert.     Cranial Nerves: Cranial nerves  2-12 are intact.     Sensory: Sensation is intact.     Motor: Motor function is intact.     Deep Tendon Reflexes:     Reflex Scores:      Bicep reflexes are 2+ on the right side and 2+ on the left side.      Patellar reflexes are 2+ on the right side and 2+ on the left side.    Comments: Bilateral upper and lower extremity strength 5/5      No results found for any visits on 09/23/24.    The ASCVD Risk score (Arnett DK, et al., 2019) failed to calculate for the following reasons:   Risk score cannot be calculated because patient has a medical history suggesting prior/existing ASCVD    Assessment &  Plan:   Problem List Items Addressed This Visit   None Visit Diagnoses       Impacted cerumen of right ear    -  Primary   Relevant Orders   Ear Lavage     Lightheadedness       Relevant Orders   CBC   Basic metabolic panel with GFR   TSH   Ear Lavage     History of vertigo          Assessment and Plan Assessment & Plan Dizziness and lightheadedness Intermittent dizziness and lightheadedness with recent episode. Symptoms resolved by end of day. Neurological exam unremarkable, suggesting peripheral cause. Differential includes cerumen impaction. Discussed gabapentin  reduction unlikely cause. - Order blood tests for thyroid  function and blood counts. - Consider weaning off gabapentin  if symptoms persist.  Cerumen impaction, right ear Right ear cerumen impaction potentially contributing to dizziness and lightheadedness. - Rinse out right ear to remove cerumen. - Verbal consent was obtained.  Patient was prepped per office policy using cerumen softening eardrops.  A mixture of water and hydroperoxide was used.  Right ear was irrigated.  Patient tolerated procedure well.  Impaction was not removed.  Patient is followed by ENT already.  She will follow-up to have impaction removed  Essential tremor (parkinsonism, not Parkinson's disease) Long-standing essential tremor, not Parkinson's disease. No significant progression. Prefers symptom management without medication due to potential drug interactions.    Return in about 3 months (around 12/24/2024) for TOC/CPE.    Adina Crandall, NP

## 2024-09-27 ENCOUNTER — Ambulatory Visit: Payer: Self-pay | Admitting: Nurse Practitioner

## 2024-10-19 ENCOUNTER — Ambulatory Visit
Admission: EM | Admit: 2024-10-19 | Discharge: 2024-10-19 | Disposition: A | Discharge status: Home / Self Care | Attending: Emergency Medicine | Admitting: Emergency Medicine

## 2024-10-19 DIAGNOSIS — J0100 Acute maxillary sinusitis, unspecified: Secondary | ICD-10-CM | POA: Diagnosis not present

## 2024-10-19 MED ORDER — AMOXICILLIN-POT CLAVULANATE 875-125 MG PO TABS: 1.0000 | ORAL_TABLET | Freq: Two times a day (BID) | ORAL | 0 refills | Status: AC

## 2024-10-19 NOTE — Discharge Instructions (Addendum)
 Take the Augmentin as directed.  Follow up with your primary care provider.

## 2024-10-19 NOTE — ED Triage Notes (Signed)
 Patient states for about one week ago he has been having cough, rib cage pain with coughing, green nasal drainage, nasal congestion, chills, sensation of a fever, and post nasal drainage down his throat. He got into a coughing last night where he became short of breath.   He has tried a variety of OTC medications including Mucinex , Tylenol , differing cough drops, and other mediations he does not know the name of without relief.

## 2024-10-19 NOTE — ED Provider Notes (Signed)
 Matthew Doyle    CSN: [id number] Arrival date & time: 10/19/24  0855      History   Chief Complaint Chief Complaint  Patient presents with   Cough   Nasal Congestion    HPI Matthew Doyle is a 68 y.o. male.  Patient presents with 1 week history of congestion, sinus pressure, postnasal drip, cough.  He reports subjective fever and chills.  Treating with OTC cold and sinus medications without relief.  No chest pain, shortness of breath, vomiting, diarrhea.  The history is provided by the patient and medical records.    Past Medical History:  Diagnosis Date   Arthritis    knee   Atrial fibrillation (HCC) post MI    CAD (coronary artery disease)    a. 09/2003 Cath/PCI: LAD 80% (3.0x23 Cypher DES);  b. NSTEMI 12/2012 cath patent LAD stent, included distal right PDA supplying a small territory, 99% proximal OM 2 (DES); c. 06/20/2016 [place] STEMI/PCI: Stent to LAD, PTCA D1 & LCX; d. 01/2022 Cath: LM mild dzs, LAD 10p/m ISR, D1 80, LCX 1m, OM1 patent, RCA 20p, RPDA 99 w/ R->R collats from AM. EF 55-65%-->Med Rx.   Colon polyps    Diverticulosis    Gout    Hemorrhoids    Hyperlipidemia    Hypertension    Internal hemorrhoids without mention of complication    Myocardial infarction [facility])    Sleep apnea    ST elevation (STEMI) myocardial infarction involving left anterior descending coronary artery (HCC) 06/20/2016    Patient Active Problem List   Diagnosis Date Noted   Influenza A 01/30/2024   Acute hypoxic respiratory failure (HCC) 01/30/2024   Chronic neuropathic pain 05/22/2023   Advance directive discussed with patient 08/27/2021   Vertigo 08/27/2020   Parkinsonism (HCC) 08/07/2017   IBS (irritable bowel syndrome) 06/26/2015   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/28/2015   History of colonic polyps 08/30/2013   CAD (coronary artery disease)    Routine general medical examination at a health care facility 05/13/2012   GOUT 12/25/2010   POLYP, COLON 11/01/2007    Diverticulosis of colon 11/01/2007   Hyperlipidemia 09/17/2007   Essential hypertension 09/17/2007    Past Surgical History:  Procedure Laterality Date   BACK SURGERY  10/20/2016   Cervical fusion--Dr.Chester Colvin   CARDIAC CATHETERIZATION  2014   s/p stent   CARDIAC CATHETERIZATION N/A 06/20/2016   Procedure: Left Heart Cath and Coronary Angiography;  Surgeon: Ozell Fell, MD;  [facility] INVASIVE CV LAB;  Service: Cardiovascular;  Laterality: N/A;   CARDIAC CATHETERIZATION N/A 06/20/2016   Procedure: Coronary Stent Intervention;  Surgeon: Ozell Fell, MD;  Location: [facility] INVASIVE CV LAB;  Service: Cardiovascular;  Laterality: N/A;   CHOLECYSTECTOMY N/A 03/13/2016   Procedure: LAPAROSCOPIC CHOLECYSTECTOMY WITH INTRAOPERATIVE CHOLANGIOGRAM;  Surgeon: Louanne KANDICE Muse, MD;  Location: [facility] ORS;  Service: General;  Laterality: N/A;   CHOLECYSTECTOMY, LAPAROSCOPIC  03/13/2016   Infection - lengthy diagnosis d/t lack of gallstones   COLONOSCOPY  10/2013   CORONARY ANGIOPLASTY     STENTS   Cypher Stent  09/2003   mid LAD- gupta   LEFT HEART CATH AND CORONARY ANGIOGRAPHY Left 02/28/2022   Procedure: LEFT HEART CATH AND CORONARY ANGIOGRAPHY;  Surgeon: Darron Deatrice LABOR, MD;  Location: [facility] INVASIVE CV LAB;  Service: Cardiovascular;  Laterality: Left;   LEFT HEART CATHETERIZATION WITH CORONARY ANGIOGRAM N/A 12/17/2012   Procedure: LEFT HEART CATHETERIZATION WITH CORONARY ANGIOGRAM;  Surgeon: Peter M Jordan, MD;  Location: [facility] CATH LAB;  Service: Cardiovascular;  Laterality: N/A;   MULTIPLE TOOTH EXTRACTIONS     PERCUTANEOUS CORONARY STENT INTERVENTION (PCI-S) Right 12/17/2012   Procedure: PERCUTANEOUS CORONARY STENT INTERVENTION (PCI-S);  Surgeon: Peter M Jordan, MD;  Location: [facility] CATH LAB;  Service: Cardiovascular;  Laterality: Right;       Home Medications    Prior to Admission medications   Medication Sig Start Date End Date Taking? Authorizing Provider  amLODipine   (NORVASC ) 10 MG tablet Take 1 tablet (10 mg total) by mouth daily. 05/27/24  Yes Vivienne Lonni Ingle, NP  amoxicillin -clavulanate (AUGMENTIN ) 875-125 MG tablet Take 1 tablet by mouth every 12 (twelve) hours. 10/19/24  Yes Corlis Burnard DEL, NP  aspirin  81 MG tablet Take 1 tablet (81 mg total) by mouth daily. 12/18/12  Yes Barrett, Shona MATSU, PA-C  atorvastatin  (LIPITOR ) 80 MG tablet Take 1 tablet (80 mg total) by mouth daily. 05/27/24  Yes Vivienne Lonni Ingle, NP  clopidogrel  (PLAVIX ) 75 MG tablet TAKE ONE TABLET (75 MG TOTAL) BY MOUTH DAILY. 06/09/24  Yes Vivienne Lonni Ingle, NP  gabapentin  (NEURONTIN ) 300 MG capsule Take 1-2 capsules (300-600 mg total) by mouth at bedtime. 09/23/24  Yes Wendee Lynwood HERO, NP  losartan  (COZAAR ) 100 MG tablet Take 1 tablet (100 mg total) by mouth daily. 05/27/24  Yes Vivienne Lonni Ingle, NP  meclizine  (ANTIVERT ) 12.5 MG tablet Take 1 tablet (12.5 mg total) by mouth 3 (three) times daily as needed for dizziness. 08/24/20  Yes Corlis Burnard DEL, NP  nitroGLYCERIN  (NITROSTAT ) 0.4 MG SL tablet Place 1 tablet (0.4 mg total) under the tongue every 5 (five) minutes as needed for chest pain. 02/27/22  Yes Darron Deatrice LABOR, MD    Family History Family History  Problem Relation Age of Onset   Diabetes Father    Coronary artery disease Father        CABG - 5 vessel 15 yr ago   Heart attack Paternal Uncle        Age 16   Heart attack Maternal Uncle        Age 77   Coronary artery disease Maternal Grandfather    Heart attack Maternal Grandfather        Died in 4s   Throat cancer Maternal Grandfather    Hypertension Mother    Alzheimer's disease Maternal Grandmother    Hypertension Brother    Colon cancer Neg Hx    Esophageal cancer Neg Hx    Rectal cancer Neg Hx    Stomach cancer Neg Hx     Social History Social History   Tobacco Use   Smoking status: Never    Passive exposure: Past   Smokeless tobacco: Never  Vaping Use   Vaping status: Never  Used  Substance Use Topics   Alcohol use: No    Alcohol/week: 0.0 standard drinks of alcohol   Drug use: No     Allergies   Lisinopril   Review of Systems Review of Systems  Constitutional:  Positive for chills. Negative for fever.  HENT:  Positive for congestion, postnasal drip and rhinorrhea. Negative for ear pain and sore throat.   Respiratory:  Positive for cough. Negative for shortness of breath.   Cardiovascular:  Negative for chest pain and palpitations.     Physical Exam Triage Vital Signs ED Triage Vitals  Encounter Vitals Group     BP 10/19/24 1015 (!) 153/77     Girls Systolic BP Percentile --  Girls Diastolic BP Percentile --      Boys Systolic BP Percentile --      Boys Diastolic BP Percentile --      Pulse Rate 10/19/24 1015 67     Resp 10/19/24 1015 18     Temp 10/19/24 1015 97.7 F (36.5 C)     Temp Source 10/19/24 1015 Oral     SpO2 10/19/24 1015 99 %     Weight --      Height --      Head Circumference --      Peak Flow --      Pain Score 10/19/24 1013 5     Pain Loc --      Pain Education --      Exclude from Growth Chart --    No data found.  Updated Vital Signs BP (!) 153/77 (BP Location: Right Arm)   Pulse 67   Temp 97.7 F (36.5 C) (Oral)   Resp 18   SpO2 99%   Visual Acuity Right Eye Distance:   Left Eye Distance:   Bilateral Distance:    Right Eye Near:   Left Eye Near:    Bilateral Near:     Physical Exam Constitutional:      General: He is not in acute distress. HENT:     Right Ear: Tympanic membrane normal.     Left Ear: Tympanic membrane normal.     Nose: Congestion and rhinorrhea present.     Mouth/Throat:     Mouth: Mucous membranes are moist.     Pharynx: Oropharynx is clear.  Cardiovascular:     Rate and Rhythm: Normal rate and regular rhythm.     Heart sounds: Normal heart sounds.  Pulmonary:     Effort: Pulmonary effort is normal. No respiratory distress.     Breath sounds: Normal breath sounds.   Neurological:     Mental Status: He is alert.      UC Treatments / Results  Labs (all labs ordered are listed, but only abnormal results are displayed) Labs Reviewed - No data to display  EKG   Radiology No results found.  Procedures Procedures (including critical care time)  Medications Ordered in UC Medications - No data to display  Initial Impression / Assessment and Plan / UC Course  I have reviewed the triage vital signs and the nursing notes.  Pertinent labs & imaging results that were available during my care of the patient were reviewed by me and considered in my medical decision making (see chart for details).   Acute sinusitis.  Afebrile and vital signs are stable.  Lungs are clear and O2 sat is 99% on room air.  Treating today with Augmentin .  Tylenol  as needed.  Plain Mucinex  as needed.  Instructed patient to follow-up with his PCP.  Education provided on sinus infection.  He agrees to plan of care.   Final Clinical Impressions(s) / UC Diagnoses   Final diagnoses:  Acute non-recurrent maxillary sinusitis     Discharge Instructions      Take the Augmentin  as directed.  Follow-up with your primary care provider.     ED Prescriptions     Medication Sig Dispense Auth. Provider   amoxicillin -clavulanate (AUGMENTIN ) 875-125 MG tablet Take 1 tablet by mouth every 12 (twelve) hours. 14 tablet Corlis Burnard DEL, NP      PDMP not reviewed this encounter.   Corlis Burnard DEL, NP 10/19/24 1043

## 2025-01-31 ENCOUNTER — Encounter: Admitting: Nurse Practitioner
